# Patient Record
Sex: Female | Born: 1985
Health system: Southern US, Community
[De-identification: ages and names within clinical notes are randomized; demographics above are authoritative.]

## PROBLEM LIST (undated history)

## (undated) ENCOUNTER — Inpatient Hospital Stay: Admission: RE | Payer: Self-pay | Source: Home / Self Care

## (undated) DIAGNOSIS — M5430 Sciatica, unspecified side: Secondary | ICD-10-CM

## (undated) DIAGNOSIS — L509 Urticaria, unspecified: Secondary | ICD-10-CM

## (undated) DIAGNOSIS — R51 Headache: Secondary | ICD-10-CM

## (undated) DIAGNOSIS — N189 Chronic kidney disease, unspecified: Secondary | ICD-10-CM

## (undated) DIAGNOSIS — D649 Anemia, unspecified: Secondary | ICD-10-CM

## (undated) DIAGNOSIS — K219 Gastro-esophageal reflux disease without esophagitis: Secondary | ICD-10-CM

## (undated) DIAGNOSIS — E876 Hypokalemia: Secondary | ICD-10-CM

## (undated) HISTORY — PX: WISDOM TOOTH EXTRACTION: SHX21

## (undated) HISTORY — DX: Hypokalemia: E87.6

## (undated) HISTORY — DX: Sciatica, unspecified side: M54.30

## (undated) HISTORY — DX: Urticaria, unspecified: L50.9

## (undated) HISTORY — PX: FINGER FRACTURE SURGERY: SHX638

---

## 2011-06-04 ENCOUNTER — Emergency Department (HOSPITAL_COMMUNITY)
Admission: EM | Admit: 2011-06-04 | Discharge: 2011-06-04 | Disposition: A | Discharge status: Home / Self Care | Payer: No Typology Code available for payment source | Attending: Emergency Medicine | Admitting: Emergency Medicine

## 2011-06-04 ENCOUNTER — Emergency Department (HOSPITAL_COMMUNITY): Payer: No Typology Code available for payment source

## 2011-06-04 ENCOUNTER — Encounter: Payer: Self-pay | Admitting: Emergency Medicine

## 2011-06-04 DIAGNOSIS — IMO0002 Reserved for concepts with insufficient information to code with codable children: Secondary | ICD-10-CM | POA: Insufficient documentation

## 2011-06-04 DIAGNOSIS — M79609 Pain in unspecified limb: Secondary | ICD-10-CM | POA: Insufficient documentation

## 2011-06-04 DIAGNOSIS — R0789 Other chest pain: Secondary | ICD-10-CM

## 2011-06-04 DIAGNOSIS — S62607A Fracture of unspecified phalanx of left little finger, initial encounter for closed fracture: Secondary | ICD-10-CM

## 2011-06-04 LAB — CBC
HCT: 42.5 % (ref 36.0–46.0)
Hemoglobin: 14.7 g/dL (ref 12.0–15.0)
MCH: 31.9 pg (ref 26.0–34.0)
MCHC: 34.6 g/dL (ref 30.0–36.0)
MCV: 92.2 fL (ref 78.0–100.0)
Platelets: 223 10*3/uL (ref 150–400)
RBC: 4.61 MIL/uL (ref 3.87–5.11)
RDW: 13.4 % (ref 11.5–15.5)
WBC: 9.6 10*3/uL (ref 4.0–10.5)

## 2011-06-04 LAB — DIFFERENTIAL
Basophils Absolute: 0 10*3/uL (ref 0.0–0.1)
Basophils Relative: 0 % (ref 0–1)
Eosinophils Absolute: 0.3 10*3/uL (ref 0.0–0.7)
Eosinophils Relative: 3 % (ref 0–5)
Lymphocytes Relative: 32 % (ref 12–46)
Lymphs Abs: 3.1 10*3/uL (ref 0.7–4.0)
Monocytes Absolute: 0.6 10*3/uL (ref 0.1–1.0)
Monocytes Relative: 6 % (ref 3–12)
Neutro Abs: 5.7 10*3/uL (ref 1.7–7.7)
Neutrophils Relative %: 59 % (ref 43–77)

## 2011-06-04 LAB — POCT I-STAT, CHEM 8
BUN: 10 mg/dL (ref 6–23)
Calcium, Ion: 1.2 mmol/L (ref 1.12–1.32)
Chloride: 107 mEq/L (ref 96–112)
Creatinine, Ser: 0.8 mg/dL (ref 0.50–1.10)
Glucose, Bld: 106 mg/dL — ABNORMAL HIGH (ref 70–99)
HCT: 45 % (ref 36.0–46.0)
Hemoglobin: 15.3 g/dL — ABNORMAL HIGH (ref 12.0–15.0)
Potassium: 3.6 mEq/L (ref 3.5–5.1)
Sodium: 143 mEq/L (ref 135–145)
TCO2: 23 mmol/L (ref 0–100)

## 2011-06-04 MED ORDER — IOHEXOL 300 MG/ML  SOLN
93.0000 mL | Freq: Once | INTRAMUSCULAR | Status: AC | PRN
  Administered 2011-06-04: 93 mL via INTRAVENOUS

## 2011-06-04 MED ORDER — HYDROCODONE-ACETAMINOPHEN 5-325 MG PO TABS: 1.0000 | ORAL_TABLET | ORAL | Status: DC | PRN

## 2011-06-04 MED ORDER — IBUPROFEN 800 MG PO TABS: 800.0000 mg | ORAL_TABLET | Freq: Three times a day (TID) | ORAL | Status: AC

## 2011-06-04 MED ORDER — FENTANYL CITRATE 0.05 MG/ML IJ SOLN
100.0000 ug | Freq: Once | INTRAMUSCULAR | Status: AC
  Administered 2011-06-04: 100 ug via INTRAVENOUS
  Filled 2011-06-04: qty 2
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
## 2011-06-04 NOTE — Progress Notes (Signed)
Orthopedic Tech Progress Note Patient Details:  Judy Conner Jun 05, 1986 [id number] Finger splint     Cammer, Mickie Bail 06/04/2011, 11:17 AM

## 2011-06-04 NOTE — ED Notes (Signed)
Paged ortho tech to bedside for finger splint
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
## 2011-06-04 NOTE — ED Notes (Signed)
Pt to xray for scans, medicated per [facility]
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
## 2011-06-04 NOTE — ED Notes (Signed)
Dc iv 
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
## 2011-06-05 ENCOUNTER — Emergency Department (HOSPITAL_COMMUNITY)
Admission: EM | Admit: 2011-06-05 | Discharge: 2011-06-06 | Disposition: A | Discharge status: Home / Self Care | Payer: No Typology Code available for payment source | Attending: Emergency Medicine | Admitting: Emergency Medicine

## 2011-06-05 ENCOUNTER — Encounter (HOSPITAL_COMMUNITY): Payer: Self-pay | Admitting: *Deleted

## 2011-06-05 DIAGNOSIS — M549 Dorsalgia, unspecified: Secondary | ICD-10-CM

## 2011-06-05 DIAGNOSIS — R51 Headache: Secondary | ICD-10-CM

## 2011-06-05 DIAGNOSIS — M25519 Pain in unspecified shoulder: Secondary | ICD-10-CM | POA: Insufficient documentation

## 2011-06-05 DIAGNOSIS — M546 Pain in thoracic spine: Secondary | ICD-10-CM | POA: Insufficient documentation

## 2011-06-05 DIAGNOSIS — Z79899 Other long term (current) drug therapy: Secondary | ICD-10-CM | POA: Insufficient documentation

## 2011-06-05 DIAGNOSIS — S20219A Contusion of unspecified front wall of thorax, initial encounter: Secondary | ICD-10-CM | POA: Insufficient documentation

## 2011-06-05 DIAGNOSIS — M5380 Other specified dorsopathies, site unspecified: Secondary | ICD-10-CM | POA: Insufficient documentation
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
## 2011-06-06 ENCOUNTER — Emergency Department (HOSPITAL_COMMUNITY): Payer: No Typology Code available for payment source

## 2011-06-06 MED ORDER — DIAZEPAM 5 MG PO TABS: 5.0000 mg | ORAL_TABLET | Freq: Two times a day (BID) | ORAL | Status: AC
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
## 2011-06-09 ENCOUNTER — Other Ambulatory Visit: Payer: Self-pay | Admitting: Orthopedic Surgery

## 2011-06-09 ENCOUNTER — Encounter (HOSPITAL_COMMUNITY): Payer: Self-pay | Admitting: Pharmacy Technician

## 2011-06-09 NOTE — ED Provider Notes (Signed)
Evaluation and management procedures were performed by the mid-level provider (PA/NP/CNM) under my supervision/collaboration. I was present and available during the ED course. Judy Schnick Y.   Judy Conner. Shashana Fullington, MD 06/09/11 1024

## 2011-06-10 MED ORDER — DEXTROSE 5 % IV SOLN: 1.0000 g | INTRAVENOUS | Status: DC

## 2011-06-11 ENCOUNTER — Ambulatory Visit (HOSPITAL_COMMUNITY)
Admission: RE | Admit: 2011-06-11 | Discharge: 2011-06-11 | Disposition: A | Discharge status: Home / Self Care | Payer: No Typology Code available for payment source | Source: Ambulatory Visit | Attending: Orthopedic Surgery | Admitting: Orthopedic Surgery

## 2011-06-11 ENCOUNTER — Encounter (HOSPITAL_COMMUNITY): Payer: Self-pay | Admitting: *Deleted

## 2011-06-11 ENCOUNTER — Ambulatory Visit (HOSPITAL_COMMUNITY): Payer: No Typology Code available for payment source | Admitting: *Deleted

## 2011-06-11 ENCOUNTER — Encounter (HOSPITAL_COMMUNITY)
Admission: RE | Disposition: A | Discharge status: Home / Self Care | Payer: Self-pay | Source: Ambulatory Visit | Attending: Orthopedic Surgery

## 2011-06-11 DIAGNOSIS — S62639A Displaced fracture of distal phalanx of unspecified finger, initial encounter for closed fracture: Secondary | ICD-10-CM | POA: Insufficient documentation

## 2011-06-11 DIAGNOSIS — K219 Gastro-esophageal reflux disease without esophagitis: Secondary | ICD-10-CM | POA: Insufficient documentation

## 2011-06-11 DIAGNOSIS — S62609A Fracture of unspecified phalanx of unspecified finger, initial encounter for closed fracture: Secondary | ICD-10-CM

## 2011-06-11 DIAGNOSIS — F17200 Nicotine dependence, unspecified, uncomplicated: Secondary | ICD-10-CM | POA: Insufficient documentation

## 2011-06-11 HISTORY — DX: Gastro-esophageal reflux disease without esophagitis: K21.9

## 2011-06-11 HISTORY — DX: Headache: R51

## 2011-06-11 HISTORY — DX: Anemia, unspecified: D64.9

## 2011-06-11 HISTORY — DX: Chronic kidney disease, unspecified: N18.9

## 2011-06-11 LAB — COMPREHENSIVE METABOLIC PANEL
ALT: 17 U/L (ref 0–35)
AST: 18 U/L (ref 0–37)
Albumin: 3.9 g/dL (ref 3.5–5.2)
Alkaline Phosphatase: 62 U/L (ref 39–117)
BUN: 12 mg/dL (ref 6–23)
CO2: 25 mEq/L (ref 19–32)
Calcium: 9.9 mg/dL (ref 8.4–10.5)
Chloride: 103 mEq/L (ref 96–112)
Creatinine, Ser: 0.82 mg/dL (ref 0.50–1.10)
GFR calc Af Amer: >90 mL/min (ref >90)
GFR calc non Af Amer: >90 mL/min (ref >90)
Glucose, Bld: 97 mg/dL (ref 70–99)
Potassium: 3.8 mEq/L (ref 3.5–5.1)
Sodium: 140 mEq/L (ref 135–145)
Total Bilirubin: 0.3 mg/dL (ref 0.3–1.2)
Total Protein: 7 g/dL (ref 6.0–8.3)

## 2011-06-11 LAB — PROTIME-INR
INR: 1.06 (ref 0.00–1.49)
Prothrombin Time: 14 seconds (ref 11.6–15.2)

## 2011-06-11 LAB — URINE MICROSCOPIC-ADD ON

## 2011-06-11 LAB — URINALYSIS, ROUTINE W REFLEX MICROSCOPIC
Bilirubin Urine: NEGATIVE
Glucose, UA: NEGATIVE mg/dL
Ketones, ur: NEGATIVE mg/dL
Leukocytes, UA: NEGATIVE
Nitrite: NEGATIVE
Protein, ur: NEGATIVE mg/dL
Specific Gravity, Urine: 1.026 (ref 1.005–1.030)
Urobilinogen, UA: 1 mg/dL (ref 0.0–1.0)
pH: 6 (ref 5.0–8.0)

## 2011-06-11 LAB — CBC
HCT: 40.9 % (ref 36.0–46.0)
Hemoglobin: 13.8 g/dL (ref 12.0–15.0)
MCH: 30.9 pg (ref 26.0–34.0)
MCHC: 33.7 g/dL (ref 30.0–36.0)
MCV: 91.7 fL (ref 78.0–100.0)
Platelets: 251 10*3/uL (ref 150–400)
RBC: 4.46 MIL/uL (ref 3.87–5.11)
RDW: 13 % (ref 11.5–15.5)
WBC: 9.3 10*3/uL (ref 4.0–10.5)

## 2011-06-11 LAB — APTT: aPTT: 34 seconds (ref 24–37)

## 2011-06-11 LAB — SURGICAL PCR SCREEN
MRSA, PCR: NEGATIVE
Staphylococcus aureus: POSITIVE — AB

## 2011-06-11 LAB — HCG, SERUM, QUALITATIVE: Preg, Serum: NEGATIVE

## 2011-06-11 SURGERY — CLOSED REDUCTION, FRACTURE, METACARPAL BONE, WITH PERCUTANEOUS PINNING
Anesthesia: General | Site: Finger | Laterality: Left | Wound class: Clean

## 2011-06-11 MED ORDER — MUPIROCIN 2 % EX OINT
TOPICAL_OINTMENT | CUTANEOUS | Status: AC
  Filled 2011-06-11: qty 22

## 2011-06-11 MED ORDER — MUPIROCIN 2 % EX OINT
TOPICAL_OINTMENT | Freq: Two times a day (BID) | CUTANEOUS | Status: DC
  Administered 2011-06-11: 06:00:00 via NASAL

## 2011-06-11 MED ORDER — ONDANSETRON HCL 4 MG/2ML IJ SOLN
4.0000 mg | Freq: Once | INTRAMUSCULAR | Status: AC | PRN
  Administered 2011-06-11: 4 mg via INTRAVENOUS

## 2011-06-11 MED ORDER — PROMETHAZINE HCL 25 MG RE SUPP
25.0000 mg | RECTAL | Status: DC
  Filled 2011-06-11: qty 1

## 2011-06-11 MED ORDER — MIDAZOLAM HCL 5 MG/5ML IJ SOLN
INTRAMUSCULAR | Status: DC | PRN
  Administered 2011-06-11: 2 mg via INTRAVENOUS

## 2011-06-11 MED ORDER — ONDANSETRON HCL 4 MG/2ML IJ SOLN
INTRAMUSCULAR | Status: DC | PRN
  Administered 2011-06-11: 4 mg via INTRAVENOUS

## 2011-06-11 MED ORDER — KCL IN DEXTROSE-NACL 20-5-0.45 MEQ/L-%-% IV SOLN
INTRAVENOUS | Status: DC
  Filled 2011-06-11 (×2): qty 1000

## 2011-06-11 MED ORDER — FENTANYL CITRATE 0.05 MG/ML IJ SOLN
INTRAMUSCULAR | Status: DC | PRN
  Administered 2011-06-11 (×3): 25 ug via INTRAVENOUS
  Administered 2011-06-11 (×2): 50 ug via INTRAVENOUS
  Administered 2011-06-11 (×3): 25 ug via INTRAVENOUS

## 2011-06-11 MED ORDER — SODIUM CHLORIDE 0.9 % IR SOLN
Status: DC | PRN
  Administered 2011-06-11: 1000 mL

## 2011-06-11 MED ORDER — MORPHINE SULFATE 2 MG/ML IJ SOLN: 0.0500 mg/kg | INTRAMUSCULAR | Status: DC | PRN

## 2011-06-11 MED ORDER — CEFAZOLIN SODIUM 1-5 GM-% IV SOLN
INTRAVENOUS | Status: AC
  Administered 2011-06-11: 1 g via INTRAVENOUS
  Filled 2011-06-11: qty 50

## 2011-06-11 MED ORDER — MEPERIDINE HCL 25 MG/ML IJ SOLN: 6.2500 mg | INTRAMUSCULAR | Status: DC | PRN

## 2011-06-11 MED ORDER — LACTATED RINGERS IV SOLN
INTRAVENOUS | Status: DC | PRN
  Administered 2011-06-11 (×2): via INTRAVENOUS

## 2011-06-11 MED ORDER — BUPIVACAINE HCL (PF) 0.25 % IJ SOLN
INTRAMUSCULAR | Status: DC | PRN
  Administered 2011-06-11: 5 mL

## 2011-06-11 MED ORDER — MUPIROCIN 2 % EX OINT: TOPICAL_OINTMENT | Freq: Two times a day (BID) | CUTANEOUS | Status: DC

## 2011-06-11 MED ORDER — HYDROMORPHONE HCL PF 1 MG/ML IJ SOLN
0.2500 mg | INTRAMUSCULAR | Status: DC | PRN
  Administered 2011-06-11 (×3): 0.25 mg via INTRAVENOUS

## 2011-06-11 MED ORDER — PROPOFOL 10 MG/ML IV EMUL
INTRAVENOUS | Status: DC | PRN
  Administered 2011-06-11: 200 mg via INTRAVENOUS

## 2011-06-11 MED ORDER — CHLORHEXIDINE GLUCONATE 4 % EX LIQD: 60.0000 mL | Freq: Once | CUTANEOUS | Status: DC

## 2011-06-11 MED ORDER — HYDROCODONE-ACETAMINOPHEN 5-325 MG PO TABS: 1.0000 | ORAL_TABLET | ORAL | Status: AC | PRN

## 2011-06-11 SURGICAL SUPPLY — 49 items
BANDAGE ELASTIC 3 VELCRO ST LF (GAUZE/BANDAGES/DRESSINGS) ×2 IMPLANT
BANDAGE ELASTIC 4 VELCRO ST LF (GAUZE/BANDAGES/DRESSINGS) ×2 IMPLANT
BANDAGE GAUZE ELAST BULKY 4 IN (GAUZE/BANDAGES/DRESSINGS) ×2 IMPLANT
BLADE SURG ROTATE 9660 (MISCELLANEOUS) IMPLANT
BNDG ELASTIC 2 VLCR STRL LF (GAUZE/BANDAGES/DRESSINGS) ×2 IMPLANT
BNDG ESMARK 4X9 LF (GAUZE/BANDAGES/DRESSINGS) IMPLANT
CLOTH BEACON ORANGE TIMEOUT ST (SAFETY) ×2 IMPLANT
COVER SURGICAL LIGHT HANDLE (MISCELLANEOUS) ×2 IMPLANT
CUFF TOURNIQUET SINGLE 18IN (TOURNIQUET CUFF) ×2 IMPLANT
CUFF TOURNIQUET SINGLE 24IN (TOURNIQUET CUFF) IMPLANT
DRAPE OEC MINIVIEW 54X84 (DRAPES) ×2 IMPLANT
DRAPE U-SHAPE 47X51 STRL (DRAPES) ×2 IMPLANT
ELECT REM PT RETURN 9FT ADLT (ELECTROSURGICAL)
ELECTRODE REM PT RTRN 9FT ADLT (ELECTROSURGICAL) IMPLANT
GAUZE SPONGE 4X4 12PLY STRL LF (GAUZE/BANDAGES/DRESSINGS) ×2 IMPLANT
GAUZE XEROFORM 1X8 LF (GAUZE/BANDAGES/DRESSINGS) ×2 IMPLANT
GLOVE BIO SURGEON STRL SZ7.5 (GLOVE) ×2 IMPLANT
GLOVE BIOGEL M 8.0 STRL (GLOVE) ×2 IMPLANT
GLOVE BIOGEL M STRL SZ7.5 (GLOVE) ×2 IMPLANT
GLOVE BIOGEL PI IND STRL 6.5 (GLOVE) ×1 IMPLANT
GLOVE BIOGEL PI IND STRL 7.5 (GLOVE) ×1 IMPLANT
GLOVE BIOGEL PI IND STRL 8 (GLOVE) ×2 IMPLANT
GLOVE BIOGEL PI INDICATOR 6.5 (GLOVE) ×1
GLOVE BIOGEL PI INDICATOR 7.5 (GLOVE) ×1
GLOVE BIOGEL PI INDICATOR 8 (GLOVE) ×2
GLOVE SKINSENSE NS SZ7.5 (GLOVE) ×1
GLOVE SKINSENSE STRL SZ7.5 (GLOVE) ×1 IMPLANT
GOWN PREVENTION PLUS XLARGE (GOWN DISPOSABLE) ×6 IMPLANT
GOWN STRL NON-REIN LRG LVL3 (GOWN DISPOSABLE) ×6 IMPLANT
KIT BASIN OR (CUSTOM PROCEDURE TRAY) ×2 IMPLANT
KIT ROOM TURNOVER OR (KITS) ×2 IMPLANT
KWIRE 4.0 X .045IN (WIRE) ×4 IMPLANT
MANIFOLD NEPTUNE II (INSTRUMENTS) IMPLANT
NEEDLE 22X1 1/2 (OR ONLY) (NEEDLE) IMPLANT
NS IRRIG 1000ML POUR BTL (IV SOLUTION) ×2 IMPLANT
PACK ORTHO EXTREMITY (CUSTOM PROCEDURE TRAY) ×2 IMPLANT
PAD ARMBOARD 7.5X6 YLW CONV (MISCELLANEOUS) ×2 IMPLANT
PAD CAST 4YDX4 CTTN HI CHSV (CAST SUPPLIES) ×1 IMPLANT
PADDING CAST COTTON 2X4 NS (CAST SUPPLIES) ×2 IMPLANT
PADDING CAST COTTON 4X4 STRL (CAST SUPPLIES) ×1
SPONGE GAUZE 4X4 12PLY (GAUZE/BANDAGES/DRESSINGS) ×2 IMPLANT
SPONGE LAP 4X18 X RAY DECT (DISPOSABLE) IMPLANT
SUCTION FRAZIER TIP 10 FR DISP (SUCTIONS) IMPLANT
SUT ETHILON 4 0 PS 2 18 (SUTURE) IMPLANT
SYR CONTROL 10ML LL (SYRINGE) IMPLANT
TOWEL OR 17X24 6PK STRL BLUE (TOWEL DISPOSABLE) ×2 IMPLANT
TOWEL OR 17X26 10 PK STRL BLUE (TOWEL DISPOSABLE) ×2 IMPLANT
TUBE CONNECTING 12X1/4 (SUCTIONS) ×2 IMPLANT
WATER STERILE IRR 1000ML POUR (IV SOLUTION) ×2 IMPLANT

## 2011-06-11 NOTE — Preoperative (Signed)
Beta Blockers   Reason not to administer Beta Blockers:Not Applicable 

## 2011-06-11 NOTE — Brief Op Note (Signed)
06/11/2011  8:37 AM  PATIENT:  Judy Conner  25 y.o. female  PRE-OPERATIVE DIAGNOSIS:  Left Fifth Finger Fracture  POST-OPERATIVE DIAGNOSIS:  Left Fifth Finger Fracture  PROCEDURE:  Procedure(s): CLOSED REDUCTION PHALANX WITH PERCUTANEOUS PINNING  SURGEON:  Surgeon(s): Mable Paris, MD  PHYSICIAN ASSISTANT:   ASSISTANTS: none   ANESTHESIA:   local and general  EBL:  Total I/O In: 1000 [I.V.:1000] Out: 10 [Blood:10]  BLOOD ADMINISTERED:none  DRAINS: none   LOCAL MEDICATIONS USED:  MARCAINE 5CC  SPECIMEN:  No Specimen  DISPOSITION OF SPECIMEN:  N/A  COUNTS:  YES  TOURNIQUET:  * Missing tourniquet times found for documented tourniquets in log:  10814 *  DICTATION: .Other Dictation: Dictation Number [id number]  PLAN OF CARE: Discharge to home after PACU  PATIENT DISPOSITION:  PACU - hemodynamically stable.   Delay start of Pharmacological VTE agent (>24hrs) due to surgical blood loss or risk of bleeding:  {YES/NO/NOT APPLICABLE:20182

## 2011-06-11 NOTE — Transfer of Care (Signed)
Immediate Anesthesia Transfer of Care Note  Patient: Judy Conner  Procedure(s) Performed:  CLOSED REDUCTION METACARPAL WITH PERCUTANEOUS PINNING - left 5th finger  Patient Location: PACU  Anesthesia Type: General  Level of Consciousness: awake, oriented and patient cooperative  Airway & Oxygen Therapy: Patient Spontanous Breathing and Patient connected to nasal cannula oxygen  Post-op Assessment: Report given to PACU RN and Post -op Vital signs reviewed and stable  Post vital signs: Reviewed  Complications: No apparent anesthesia complications

## 2011-06-11 NOTE — Progress Notes (Signed)
After pt was in Phase 2 approx 10 minutes, she reported feeling nauseous  Vomited less than 3 ml of clear phlegm.  Dr Sondra Come notified and order received to send pt home with one phenergan suppository and for pt to use only if needed.  Pt instructed on this and verbalized understanding.//L. Luiza Carranco,RN

## 2011-06-11 NOTE — Anesthesia Postprocedure Evaluation (Signed)
  Anesthesia Post-op Note  Patient: Judy Conner  Procedure(s) Performed:  CLOSED REDUCTION METACARPAL WITH PERCUTANEOUS PINNING - left 5th finger  Patient Location: PACU  Anesthesia Type: General  Level of Consciousness: awake, alert  and oriented  Airway and Oxygen Therapy: Patient Spontanous Breathing  Post-op Pain: mild  Post-op Assessment: Post-op Vital signs reviewed, Patient's Cardiovascular Status Stable, Respiratory Function Stable, Patent Airway, No signs of Nausea or vomiting and Pain level controlled  Post-op Vital Signs: Reviewed and stable  Complications: No apparent anesthesia complications

## 2011-06-11 NOTE — Anesthesia Preprocedure Evaluation (Addendum)
Anesthesia Evaluation  Patient identified by MRN, date of birth, ID band Patient awake    Reviewed: Allergy & Precautions, NPO status   History of Anesthesia Complications Negative for: history of anesthetic complications  Airway Mallampati: I TM Distance: >3 FB Neck ROM: Full    Dental  (+) Dental Advisory Given   Pulmonary Current Smoker,  clear to auscultation        Cardiovascular Regular Normal    Neuro/Psych  Headaches,    GI/Hepatic GERD-  Medicated and Controlled,  Endo/Other    Renal/GU      Musculoskeletal   Abdominal Normal abdominal exam  (+)   Peds  Hematology   Anesthesia Other Findings   Reproductive/Obstetrics                           Anesthesia Physical Anesthesia Plan  ASA: II  Anesthesia Plan: General   Post-op Pain Management:    Induction: Intravenous  Airway Management Planned: Oral ETT  Additional Equipment:   Intra-op Plan:   Post-operative Plan: Extubation in OR  Informed Consent: I have reviewed the patients History and Physical, chart, labs and discussed the procedure including the risks, benefits and alternatives for the proposed anesthesia with the patient or authorized representative who has indicated his/her understanding and acceptance.   Dental advisory given  Plan Discussed with: Anesthesiologist  Anesthesia Plan Comments:         Anesthesia Quick Evaluation
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
## 2011-06-11 NOTE — Progress Notes (Signed)
One phenergan 25 mg suppository given to pt.  Re instructed pt to take only as needed and pt verbalized understanding.
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
## 2011-06-11 NOTE — Addendum Note (Signed)
Addendum  created 06/11/11 1013 by Kerby Nora, MD   Modules edited:Orders

## 2012-03-19 ENCOUNTER — Emergency Department (HOSPITAL_COMMUNITY)
Admission: EM | Admit: 2012-03-19 | Discharge: 2012-03-19 | Disposition: A | Discharge status: Home / Self Care | Payer: BC Managed Care – PPO | Attending: Emergency Medicine | Admitting: Emergency Medicine

## 2012-03-19 ENCOUNTER — Encounter (HOSPITAL_COMMUNITY): Payer: Self-pay

## 2012-03-19 DIAGNOSIS — N189 Chronic kidney disease, unspecified: Secondary | ICD-10-CM | POA: Insufficient documentation

## 2012-03-19 DIAGNOSIS — Z87891 Personal history of nicotine dependence: Secondary | ICD-10-CM | POA: Insufficient documentation

## 2012-03-19 DIAGNOSIS — R112 Nausea with vomiting, unspecified: Secondary | ICD-10-CM | POA: Insufficient documentation

## 2012-03-19 LAB — COMPREHENSIVE METABOLIC PANEL
ALT: 26 U/L (ref 0–35)
AST: 21 U/L (ref 0–37)
Albumin: 4.2 g/dL (ref 3.5–5.2)
Alkaline Phosphatase: 63 U/L (ref 39–117)
BUN: 13 mg/dL (ref 6–23)
CO2: 25 mEq/L (ref 19–32)
Calcium: 9.4 mg/dL (ref 8.4–10.5)
Chloride: 102 mEq/L (ref 96–112)
Creatinine, Ser: 0.91 mg/dL (ref 0.50–1.10)
GFR calc Af Amer: >90 mL/min (ref >90)
GFR calc non Af Amer: 87 mL/min — ABNORMAL LOW (ref >90)
Glucose, Bld: 130 mg/dL — ABNORMAL HIGH (ref 70–99)
Potassium: 3 mEq/L — ABNORMAL LOW (ref 3.5–5.1)
Sodium: 139 mEq/L (ref 135–145)
Total Bilirubin: 0.3 mg/dL (ref 0.3–1.2)
Total Protein: 7.8 g/dL (ref 6.0–8.3)

## 2012-03-19 LAB — CBC WITH DIFFERENTIAL/PLATELET
Basophils Absolute: 0 10*3/uL (ref 0.0–0.1)
Basophils Relative: 0 % (ref 0–1)
Eosinophils Absolute: 0 10*3/uL (ref 0.0–0.7)
Eosinophils Relative: 0 % (ref 0–5)
HCT: 42.6 % (ref 36.0–46.0)
Hemoglobin: 15.1 g/dL — ABNORMAL HIGH (ref 12.0–15.0)
Lymphocytes Relative: 22 % (ref 12–46)
Lymphs Abs: 3.5 10*3/uL (ref 0.7–4.0)
MCH: 32.1 pg (ref 26.0–34.0)
MCHC: 35.4 g/dL (ref 30.0–36.0)
MCV: 90.6 fL (ref 78.0–100.0)
Monocytes Absolute: 0.9 10*3/uL (ref 0.1–1.0)
Monocytes Relative: 6 % (ref 3–12)
Neutro Abs: 11.1 10*3/uL — ABNORMAL HIGH (ref 1.7–7.7)
Neutrophils Relative %: 72 % (ref 43–77)
Platelets: 280 10*3/uL (ref 150–400)
RBC: 4.7 MIL/uL (ref 3.87–5.11)
RDW: 13.7 % (ref 11.5–15.5)
WBC: 15.5 10*3/uL — ABNORMAL HIGH (ref 4.0–10.5)

## 2012-03-19 LAB — URINALYSIS, ROUTINE W REFLEX MICROSCOPIC
Glucose, UA: NEGATIVE mg/dL
Ketones, ur: 15 mg/dL — AB
Nitrite: NEGATIVE
Protein, ur: 100 mg/dL — AB
Specific Gravity, Urine: 1.04 — ABNORMAL HIGH (ref 1.005–1.030)
Urobilinogen, UA: 1 mg/dL (ref 0.0–1.0)
pH: 6 (ref 5.0–8.0)

## 2012-03-19 LAB — URINE MICROSCOPIC-ADD ON

## 2012-03-19 LAB — POCT PREGNANCY, URINE: Preg Test, Ur: NEGATIVE

## 2012-03-19 MED ORDER — POTASSIUM CHLORIDE CRYS ER 20 MEQ PO TBCR
20.0000 meq | EXTENDED_RELEASE_TABLET | Freq: Once | ORAL | Status: AC
  Administered 2012-03-19: 20 meq via ORAL
  Filled 2012-03-19: qty 1

## 2012-03-19 MED ORDER — ONDANSETRON 4 MG PO TBDP
4.0000 mg | ORAL_TABLET | Freq: Once | ORAL | Status: AC
  Administered 2012-03-19: 4 mg via ORAL
  Filled 2012-03-19: qty 1

## 2012-03-19 MED ORDER — ONDANSETRON HCL 4 MG/2ML IJ SOLN
4.0000 mg | Freq: Once | INTRAMUSCULAR | Status: AC
  Administered 2012-03-19: 4 mg via INTRAVENOUS
  Filled 2012-03-19: qty 2

## 2012-03-19 MED ORDER — ONDANSETRON 8 MG PO TBDP: 8.0000 mg | ORAL_TABLET | Freq: Three times a day (TID) | ORAL | Status: AC | PRN

## 2012-03-19 MED ORDER — POTASSIUM CHLORIDE 10 MEQ/100ML IV SOLN
10.0000 meq | Freq: Once | INTRAVENOUS | Status: AC
  Administered 2012-03-19: 10 meq via INTRAVENOUS
  Filled 2012-03-19: qty 100

## 2012-03-19 MED ORDER — SODIUM CHLORIDE 0.9 % IV BOLUS (SEPSIS)
1000.0000 mL | Freq: Once | INTRAVENOUS | Status: AC
  Administered 2012-03-19: 1000 mL via INTRAVENOUS

## 2012-03-19 NOTE — ED Notes (Signed)
Pt reports lower abd pain when vomiting, N/V x3 days, unable to keep food down. Pt denies burning w/urination, abnormal vaginal bleeding, odor, or d/c. Pt reports she uses ParaGard for birth control and is unsure if that could be causing her symptoms. Pt reports LMP 03/07/2012, para gard implanted 2010, no problems w/implant before.

## 2012-03-19 NOTE — ED Notes (Signed)
Water given to patient 

## 2012-03-19 NOTE — ED Provider Notes (Signed)
History     CSN: [id number]  Arrival date & time 03/19/12  0017   First MD Initiated Contact with Patient 03/19/12 0602      Chief Complaint  Patient presents with  . Nausea  . Emesis    (Consider location/radiation/quality/duration/timing/severity/associated sxs/prior treatment) HPI Comments: Patient presents with two days of nausea and vomiting.  She reports several episodes of vomiting.  She denies diarrhea.  No blood in her emesis or blood in her stool.  She denies abdominal pain.  No fever or chills.  No sick contacts.  No recent foreign travel.    Patient is a 26 y.o. female presenting with vomiting. The history is provided by the patient.  Emesis  The problem occurs 2 to 4 times per day. The problem has been gradually worsening. The emesis has an appearance of stomach contents. There has been no fever. Pertinent negatives include no abdominal pain, no chills, no diarrhea and no fever.    Past Medical History  Diagnosis Date  . Anemia     hx low iron   . Chronic kidney disease     hx uti none currently  . GERD (gastroesophageal reflux disease)   . Headache     Past Surgical History  Procedure Date  . Cesarean section [phone]    History reviewed. No pertinent family history.  History  Substance Use Topics  . Smoking status: Former Smoker    Quit date: 05/20/2011  . Smokeless tobacco: Not on file  . Alcohol Use: No    OB History    Grav Para Term Preterm Abortions TAB SAB Ect Mult Living                  Review of Systems  Constitutional: Negative for fever and chills.  Gastrointestinal: Positive for nausea and vomiting. Negative for abdominal pain, diarrhea, constipation, blood in stool and abdominal distention.  Genitourinary: Negative for dysuria, urgency, frequency, hematuria, flank pain, vaginal discharge, difficulty urinating and pelvic pain.    Allergies  Review of patient's allergies indicates no known allergies.  Home Medications    Current Outpatient Rx  Name Route Sig Dispense Refill  . NAPROXEN SODIUM 220 MG PO TABS Oral Take 220 mg by mouth daily as needed. For pain    . RANITIDINE HCL 150 MG PO TABS Oral Take 150 mg by mouth daily as needed. For acid reflux       BP 112/66  Pulse 66  Temp 99 F (37.2 C) (Oral)  Resp 16  SpO2 95%  LMP 03/07/2012  Physical Exam  Nursing note and vitals reviewed. Constitutional: She appears well-developed and well-nourished. No distress.  HENT:  Head: Normocephalic and atraumatic.  Mouth/Throat: Oropharynx is clear and moist.  Neck: Normal range of motion. Neck supple.  Cardiovascular: Normal rate, regular rhythm and normal heart sounds.   Pulmonary/Chest: Effort normal and breath sounds normal.  Abdominal: Soft. Normal appearance and bowel sounds are normal. She exhibits no distension and no mass. There is no tenderness. There is no rebound, no guarding, no tenderness at McBurney's point and negative Murphy's sign.  Neurological: She is alert.  Skin: Skin is warm and dry. She is not diaphoretic.  Psychiatric: She has a normal mood and affect.    ED Course  Procedures (including critical care time)  Labs Reviewed  CBC WITH DIFFERENTIAL - Abnormal; Notable for the following:    WBC 15.5 (*)     Hemoglobin 15.1 (*)     Neutro Abs  11.1 (*)     All other components within normal limits  COMPREHENSIVE METABOLIC PANEL - Abnormal; Notable for the following:    Potassium 3.0 (*)     Glucose, Bld 130 (*)     GFR calc non Af Amer 87 (*)     All other components within normal limits  URINALYSIS, ROUTINE W REFLEX MICROSCOPIC - Abnormal; Notable for the following:    Color, Urine AMBER (*)  BIOCHEMICALS MAY BE AFFECTED BY COLOR   APPearance TURBID (*)     Specific Gravity, Urine 1.040 (*)     Hgb urine dipstick LARGE (*)     Bilirubin Urine SMALL (*)     Ketones, ur 15 (*)     Protein, ur 100 (*)     Leukocytes, UA SMALL (*)     All other components within normal  limits  URINE MICROSCOPIC-ADD ON - Abnormal; Notable for the following:    Squamous Epithelial / LPF MANY (*)     Bacteria, UA MANY (*)     All other components within normal limits  POCT PREGNANCY, URINE   No results found.   No diagnosis found.  Patient able to tolerate po liquids.  MDM  Patient presenting with nausea and vomiting over the past 2 days.  No abdominal pain.  On exam abdomen nontender.  Urine pregnancy negative.  VSS.  Patient is afebrile.  Patient able to tolerate po liquids.  Patient discharged home with prescription for Zofran.  Return precautions discussed.  Patient in agreement with the plan.        Pascal Lux [place], PA-C 03/21/12 0145

## 2012-03-19 NOTE — ED Notes (Signed)
Called to waiting area without reply must assume pt eloped

## 2012-03-19 NOTE — ED Notes (Signed)
Pt is actively vomiting at triage and trying to find patient a room.

## 2012-03-19 NOTE — ED Notes (Signed)
Pt states that she has been nauseated and vomiting since Friday, pt states that she has not eaten or been able to drink since Friday as well. Pt denies pain, but states she just feels bad. Pt not actively vomiting but sates she usually vomits every couple of hours.

## 2012-04-03 ENCOUNTER — Emergency Department (INDEPENDENT_AMBULATORY_CARE_PROVIDER_SITE_OTHER): Payer: BC Managed Care – PPO

## 2012-04-03 ENCOUNTER — Encounter (HOSPITAL_COMMUNITY): Payer: Self-pay | Admitting: Emergency Medicine

## 2012-04-03 ENCOUNTER — Emergency Department (INDEPENDENT_AMBULATORY_CARE_PROVIDER_SITE_OTHER)
Admission: EM | Admit: 2012-04-03 | Discharge: 2012-04-03 | Disposition: A | Discharge status: Home / Self Care | Payer: BC Managed Care – PPO | Source: Home / Self Care | Attending: Emergency Medicine | Admitting: Emergency Medicine

## 2012-04-03 DIAGNOSIS — K219 Gastro-esophageal reflux disease without esophagitis: Secondary | ICD-10-CM

## 2012-04-03 DIAGNOSIS — R112 Nausea with vomiting, unspecified: Secondary | ICD-10-CM

## 2012-04-03 LAB — POCT URINALYSIS DIP (DEVICE)
Bilirubin Urine: NEGATIVE
Glucose, UA: NEGATIVE mg/dL
Hgb urine dipstick: NEGATIVE
Ketones, ur: 15 mg/dL — AB
Leukocytes, UA: NEGATIVE
Nitrite: NEGATIVE
Protein, ur: >=300 mg/dL — AB
Specific Gravity, Urine: 1.01 (ref 1.005–1.030)
Urobilinogen, UA: 0.2 mg/dL (ref 0.0–1.0)
pH: 8.5 — ABNORMAL HIGH (ref 5.0–8.0)

## 2012-04-03 LAB — POCT I-STAT, CHEM 8
BUN: 6 mg/dL (ref 6–23)
Calcium, Ion: 1.18 mmol/L (ref 1.12–1.23)
Chloride: 108 mEq/L (ref 96–112)
Creatinine, Ser: 0.8 mg/dL (ref 0.50–1.10)
Glucose, Bld: 130 mg/dL — ABNORMAL HIGH (ref 70–99)
HCT: 50 % — ABNORMAL HIGH (ref 36.0–46.0)
Hemoglobin: 17 g/dL — ABNORMAL HIGH (ref 12.0–15.0)
Potassium: 3.8 mEq/L (ref 3.5–5.1)
Sodium: 141 mEq/L (ref 135–145)
TCO2: 21 mmol/L (ref 0–100)

## 2012-04-03 LAB — POCT H PYLORI SCREEN: H. PYLORI SCREEN, POC: NEGATIVE

## 2012-04-03 LAB — POCT PREGNANCY, URINE: Preg Test, Ur: NEGATIVE

## 2012-04-03 MED ORDER — PANTOPRAZOLE SODIUM 20 MG PO TBEC: 40.0000 mg | DELAYED_RELEASE_TABLET | Freq: Every day | ORAL | Status: DC

## 2012-04-03 MED ORDER — ONDANSETRON 4 MG PO TBDP
ORAL_TABLET | ORAL | Status: AC
  Filled 2012-04-03: qty 2

## 2012-04-03 MED ORDER — GI COCKTAIL ~~LOC~~
ORAL | Status: AC
  Filled 2012-04-03: qty 30

## 2012-04-03 MED ORDER — PANTOPRAZOLE SODIUM 40 MG PO TBEC: 40.0000 mg | DELAYED_RELEASE_TABLET | Freq: Every day | ORAL | Status: DC

## 2012-04-03 MED ORDER — GI COCKTAIL ~~LOC~~
30.0000 mL | Freq: Once | ORAL | Status: AC
  Administered 2012-04-03: 30 mL via ORAL

## 2012-04-03 MED ORDER — ONDANSETRON HCL 4 MG PO TABS: 4.0000 mg | ORAL_TABLET | Freq: Four times a day (QID) | ORAL | Status: DC

## 2012-04-03 MED ORDER — ONDANSETRON 4 MG PO TBDP
8.0000 mg | ORAL_TABLET | Freq: Once | ORAL | Status: AC
  Administered 2012-04-03: 8 mg via ORAL

## 2012-04-03 NOTE — ED Notes (Signed)
C/o of nausea and vomiting- unable to say how many times she has vomited No vomiting at present

## 2012-04-03 NOTE — ED Notes (Addendum)
Pt vomited large amt liquid emesis- Np Chatten informed- order reced  Hold GI cocktail till zofran begins to work

## 2012-04-03 NOTE — ED Provider Notes (Signed)
History     CSN: [id number]  Arrival date & time 04/03/12  4098   First MD Initiated Contact with Patient 04/03/12 1122      Chief Complaint  Patient presents with  . Nausea    (Consider location/radiation/quality/duration/timing/severity/associated sxs/prior treatment) Patient is a 26 y.o. female presenting with vomiting. The history is provided by the patient.  Emesis  This is a recurrent problem. The current episode started yesterday. The problem occurs 2 to 4 times per day. The problem has not changed since onset.The emesis has an appearance of stomach contents. There has been no fever. Associated symptoms include cough and headaches. Pertinent negatives include no abdominal pain, no chills, no diarrhea and no fever. Risk factors: no known.  Patient reports vomiting since yesterday associated with heartburn and indigestion symptoms.  Reports burning sensation in stomach, fullness in throat and brassy taste in mouth.  States she is unable to keep food down, but able to drink liquids.  States previous episode of same 2 weeks ago, evaluated at ER without clear etiology of problem.  States unable to follow up with primary care provider in the past 2 weeks as suggested. Has not taken medications at home for symptoms.    Past Medical History  Diagnosis Date  . Anemia     hx low iron   . Chronic kidney disease     hx uti none currently  . GERD (gastroesophageal reflux disease)   . Headache     Past Surgical History  Procedure Date  . Cesarean section [phone]    No family history on file.  History  Substance Use Topics  . Smoking status: Former Smoker    Quit date: 05/20/2011  . Smokeless tobacco: Not on file  . Alcohol Use: No    OB History    Grav Para Term Preterm Abortions TAB SAB Ect Mult Living                  Review of Systems  Constitutional: Positive for fatigue. Negative for fever, chills, activity change and appetite change.  HENT: Positive for  voice change.   Respiratory: Positive for cough. Negative for chest tightness, shortness of breath and wheezing.   Cardiovascular: Negative.   Gastrointestinal: Positive for nausea, vomiting and constipation. Negative for abdominal pain, diarrhea, blood in stool and abdominal distention.  Genitourinary: Negative.   Neurological: Positive for light-headedness and headaches. Negative for dizziness, syncope, speech difficulty, weakness and numbness.    Allergies  Review of patient's allergies indicates no known allergies.  Home Medications   Current Outpatient Rx  Name Route Sig Dispense Refill  . ONDANSETRON HCL 4 MG PO TABS Oral Take 1 tablet (4 mg total) by mouth every 6 (six) hours. 20 tablet 0  . PANTOPRAZOLE SODIUM 40 MG PO TBEC Oral Take 1 tablet (40 mg total) by mouth daily. 30 tablet 2  . RANITIDINE HCL 150 MG PO TABS Oral Take 150 mg by mouth daily as needed. For acid reflux       BP 146/92  Pulse 60  Temp 98.3 F (36.8 C) (Oral)  Resp 18  SpO2 97%  LMP 03/12/2012  Physical Exam  Nursing note and vitals reviewed. Constitutional: She is oriented to person, place, and time. Vital signs are normal. She appears well-developed and well-nourished. She is active and cooperative.  HENT:  Head: Normocephalic.  Mouth/Throat: Oropharynx is clear and moist. No oropharyngeal exudate.  Eyes: Conjunctivae normal are normal. Pupils are equal, round, and reactive  to light. No scleral icterus.  Neck: Trachea normal. Neck supple. No thyromegaly present.  Cardiovascular: Normal rate, regular rhythm, normal heart sounds and intact distal pulses.   No murmur heard. Pulmonary/Chest: Effort normal and breath sounds normal. No respiratory distress. She has no rales.  Abdominal: Soft. Bowel sounds are normal. There is no tenderness. There is no rebound and no guarding.  Lymphadenopathy:    She has no cervical adenopathy.  Neurological: She is alert and oriented to person, place, and time. No  cranial nerve deficit or sensory deficit. Coordination normal.  Skin: Skin is warm and dry.  Psychiatric: She has a normal mood and affect. Her speech is normal and behavior is normal. Judgment and thought content normal. Cognition and memory are normal.    ED Course  Procedures (including critical care time)  Labs Reviewed  POCT URINALYSIS DIP (DEVICE) - Abnormal; Notable for the following:    Ketones, ur 15 (*)     pH 8.5 (*)     Protein, ur >=300 (*)     All other components within normal limits  POCT I-STAT, CHEM 8 - Abnormal; Notable for the following:    Glucose, Bld 130 (*)     Hemoglobin 17.0 (*)     HCT 50.0 (*)     All other components within normal limits  POCT PREGNANCY, URINE  POCT H PYLORI SCREEN   No results found.   1. Nausea & vomiting   2. GERD (gastroesophageal reflux disease)       MDM  Able to tolerate sips prior to discharge.  Increase fluids, zofran to send home along with PPI.  Patient instructed to follow up with primary care provider, gastrologist for continued symptoms.   Additionally advised patient to seek care with urologist for further evaluation of proteinuria.         Johnsie Kindred, NP 04/06/12 1054

## 2012-04-03 NOTE — ED Notes (Signed)
Patient denies pain.  Patient c/o "Judy Conner" feeling is overwhelming.  Patient's facial expression is grimace.  Spoke to provider and went to patient's room to inform of tests to expect and encouraged liquids.  Patient agreeable

## 2012-04-03 NOTE — ED Notes (Signed)
No new order to give gi cocktail--remains on hold

## 2012-04-06 ENCOUNTER — Encounter: Payer: Self-pay | Admitting: Gastroenterology

## 2012-04-06 NOTE — ED Provider Notes (Signed)
Medical screening examination/treatment/procedure(s) were performed by non-physician practitioner and as supervising physician I was immediately available for consultation/collaboration.  Luiz Blare MD   Luiz Blare, MD 04/06/12 [phone]

## 2012-05-01 ENCOUNTER — Ambulatory Visit (INDEPENDENT_AMBULATORY_CARE_PROVIDER_SITE_OTHER): Payer: BC Managed Care – PPO | Admitting: Gastroenterology

## 2012-05-01 ENCOUNTER — Encounter: Payer: Self-pay | Admitting: Gastroenterology

## 2012-05-01 VITALS — BP 114/70 | HR 88 | Ht 64.5 in | Wt 267.4 lb

## 2012-05-01 DIAGNOSIS — K219 Gastro-esophageal reflux disease without esophagitis: Secondary | ICD-10-CM

## 2012-05-01 DIAGNOSIS — R112 Nausea with vomiting, unspecified: Secondary | ICD-10-CM

## 2012-05-01 NOTE — Progress Notes (Signed)
History of Present Illness: This is a 26 year old female who relates a several week history of frequent heartburn that was initially controlled with Zantac as needed. Her symptoms worsened to regular episodes of postprandial vomiting and she was seen at [facility] urgent care Center. Reflux was suspected and she was prescribed Protonix and Zofran. She's been eating a lighter diet and taking Protonix daily and her symptoms have resolved. Denies weight loss, abdominal pain, constipation, diarrhea, change in stool caliber, melena, hematochezia,  dysphagia, chest pain.  Review of Systems: Pertinent positive and negative review of systems were noted in the above HPI section. All other review of systems were otherwise negative.  Current Medications, Allergies, Past Medical History, Past Surgical History, Family History and Social History were reviewed in [employer] record.  Physical Exam: General: Well developed , well nourished, no acute distress Head: Normocephalic and atraumatic Eyes:  sclerae anicteric, EOMI Ears: Normal auditory acuity Mouth: No deformity or lesions Neck: Supple, no masses or thyromegaly Lungs: Clear throughout to auscultation Heart: Regular rate and rhythm; no murmurs, rubs or bruits Abdomen: Soft, non tender and non distended. No masses, hepatosplenomegaly or hernias noted. Normal Bowel sounds Musculoskeletal: Symmetrical with no gross deformities  Skin: No lesions on visible extremities Pulses:  Normal pulses noted Extremities: No clubbing, cyanosis, edema or deformities noted Neurological: Alert oriented x 4, grossly nonfocal Cervical Nodes:  No significant cervical adenopathy Inguinal Nodes: No significant inguinal adenopathy Psychological:  Alert and cooperative. Normal mood and affect  Assessment and Recommendations:  1. GERD with nausea and vomiting. Rule out esophagitis, ulcer disease, gastritis and other disorders. Continue pantoprazole 40  mg daily. Begin standard antireflux measures. Schedule upper endoscopy. The risks, benefits, and alternatives to endoscopy with possible biopsy and possible dilation were discussed with the patient and they consent to proceed.

## 2012-05-01 NOTE — Patient Instructions (Addendum)
You have been given a separate informational sheet regarding your tobacco use, the importance of quitting and local resources to help you quit.  You have been scheduled for an endoscopy with propofol. Please follow written instructions given to you at your visit today. If you use inhalers (even only as needed), please bring them with you on the day of your procedure.  Patient advised to avoid spicy, acidic, citrus, chocolate, mints, fruit and fruit juices.  Limit the intake of caffeine, alcohol and Soda.  Don't exercise too soon after eating.  Don't lie down within 3-4 hours of eating.  Elevate the head of your bed.  Continue Protonix daily.

## 2012-05-25 ENCOUNTER — Encounter: Payer: BC Managed Care – PPO | Admitting: Gastroenterology

## 2012-06-05 ENCOUNTER — Other Ambulatory Visit: Payer: Self-pay

## 2012-06-05 MED ORDER — PANTOPRAZOLE SODIUM 40 MG PO TBEC: 40.0000 mg | DELAYED_RELEASE_TABLET | Freq: Every day | ORAL | Status: DC

## 2012-06-12 ENCOUNTER — Telehealth: Payer: Self-pay | Admitting: Gastroenterology

## 2012-06-12 NOTE — Telephone Encounter (Signed)
Not able to leave a message for the patient her [employer] voicemail is full.  She is scheduled for an EGD for tomorrow.  I will attempt to reach her in the am.

## 2012-06-13 ENCOUNTER — Encounter: Payer: Self-pay | Admitting: Gastroenterology

## 2012-06-13 ENCOUNTER — Ambulatory Visit (AMBULATORY_SURGERY_CENTER): Payer: BC Managed Care – PPO | Admitting: Gastroenterology

## 2012-06-13 VITALS — BP 121/75 | HR 67 | Temp 99.3°F | Resp 28 | Ht 64.0 in | Wt 267.0 lb

## 2012-06-13 DIAGNOSIS — K219 Gastro-esophageal reflux disease without esophagitis: Secondary | ICD-10-CM

## 2012-06-13 DIAGNOSIS — R112 Nausea with vomiting, unspecified: Secondary | ICD-10-CM

## 2012-06-13 MED ORDER — SODIUM CHLORIDE 0.9 % IV SOLN: 500.0000 mL | INTRAVENOUS | Status: DC

## 2012-06-13 NOTE — Op Note (Signed)
 [facility] [address] [place], [address]   ENDOSCOPY PROCEDURE REPORT  PATIENT: Judy, Conner  MR#: [id number] BIRTHDATE: 11-16-1985 , 25  yrs. old GENDER: Female ENDOSCOPIST: Meryl Dare, MD, [facility] REFERRED BY:  [facility] ED PROCEDURE DATE:  06/13/2012 PROCEDURE:  EGD, diagnostic ASA CLASS:     Class II INDICATIONS:  Vomiting.   History of esophageal reflux. MEDICATIONS: MAC sedation, administered by CRNA and propofol (Diprivan) 250mg  IV TOPICAL ANESTHETIC: Cetacaine Spray DESCRIPTION OF PROCEDURE: After the risks benefits and alternatives of the procedure were thoroughly explained, informed consent was obtained.  The LB GIF-H180 [id number] endoscope was introduced through the mouth and advanced to the second portion of the duodenum. Without limitations.  The instrument was slowly withdrawn as the mucosa was fully examined.  ESOPHAGUS: The mucosa of the esophagus appeared normal. STOMACH: The mucosa and folds of the stomach appeared normal. DUODENUM: The duodenal mucosa showed no abnormalities in the bulb and second portion of the duodenum.  Retroflexed views revealed no abnormalities.   The scope was then withdrawn from the patient and the procedure completed.  COMPLICATIONS: There were no complications.  ENDOSCOPIC IMPRESSION: 1.   The EGD appeared normal  RECOMMENDATIONS: 1.  Anti-reflux regimen 2.  Continue PPI   eSigned:  Meryl Dare, MD, [facility] 06/13/2012 4:04 PM

## 2012-06-13 NOTE — Progress Notes (Signed)
Propofol given over incremental dosages 

## 2012-06-13 NOTE — Patient Instructions (Addendum)
Discharge instructions given with verbal understanding. Normal exam. Resume previous medications. YOU HAD AN ENDOSCOPIC PROCEDURE TODAY AT THE [facility]: Refer to the procedure report that was given to you for any specific questions about what was found during the examination.  If the procedure report does not answer your questions, please call your gastroenterologist to clarify.  If you requested that your care partner not be given the details of your procedure findings, then the procedure report has been included in a sealed envelope for you to review at your convenience later.  YOU SHOULD EXPECT: Some feelings of bloating in the abdomen. Passage of more gas than usual.  Walking can help get rid of the air that was put into your GI tract during the procedure and reduce the bloating. If you had a lower endoscopy (such as a colonoscopy or flexible sigmoidoscopy) you may notice spotting of blood in your stool or on the toilet paper. If you underwent a bowel prep for your procedure, then you may not have a normal bowel movement for a few days.  DIET: Your first meal following the procedure should be a light meal and then it is ok to progress to your normal diet.  A half-sandwich or bowl of soup is an example of a good first meal.  Heavy or fried foods are harder to digest and may make you feel nauseous or bloated.  Likewise meals heavy in dairy and vegetables can cause extra gas to form and this can also increase the bloating.  Drink plenty of fluids but you should avoid alcoholic beverages for 24 hours.  ACTIVITY: Your care partner should take you home directly after the procedure.  You should plan to take it easy, moving slowly for the rest of the day.  You can resume normal activity the day after the procedure however you should NOT DRIVE or use heavy machinery for 24 hours (because of the sedation medicines used during the test).    SYMPTOMS TO REPORT IMMEDIATELY: A gastroenterologist  can be reached at any hour.  During normal business hours, 8:30 AM to 5:00 PM Monday through Friday, call [phone].  After hours and on weekends, please call the GI answering service at [phone] who will take a message and have the physician on call contact you.   Following upper endoscopy (EGD)  Vomiting of blood or coffee ground material  New chest pain or pain under the shoulder blades  Painful or persistently difficult swallowing  New shortness of breath  Fever of 100F or higher  Black, tarry-looking stools  FOLLOW UP: If any biopsies were taken you will be contacted by phone or by letter within the next 1-3 weeks.  Call your gastroenterologist if you have not heard about the biopsies in 3 weeks.  Our staff will call the home number listed on your records the next business day following your procedure to check on you and address any questions or concerns that you may have at that time regarding the information given to you following your procedure. This is a courtesy call and so if there is no answer at the home number and we have not heard from you through the emergency physician on call, we will assume that you have returned to your regular daily activities without incident.  SIGNATURES/CONFIDENTIALITY: You and/or your care partner have signed paperwork which will be entered into your electronic medical record.  These signatures attest to the fact that that the information above on your After   Visit Summary has been reviewed and is understood.  Full responsibility of the confidentiality of this discharge information lies with you and/or your care-partner. 

## 2012-06-13 NOTE — Telephone Encounter (Signed)
I have left a message for the patient asking her to keep the appt for today at 3:00 for EGD

## 2012-06-14 ENCOUNTER — Telehealth: Payer: Self-pay | Admitting: *Deleted

## 2012-06-14 NOTE — Telephone Encounter (Signed)
  Follow up Call-  Call back number 06/13/2012  Post procedure Call Back phone  # [phone]  Permission to leave phone message Yes     Left message on answering machine to call back if experiencing problems or has questions

## 2016-07-31 DIAGNOSIS — Z5321 Procedure and treatment not carried out due to patient leaving prior to being seen by health care provider: Secondary | ICD-10-CM | POA: Insufficient documentation

## 2016-07-31 DIAGNOSIS — K219 Gastro-esophageal reflux disease without esophagitis: Secondary | ICD-10-CM | POA: Diagnosis present

## 2016-08-01 ENCOUNTER — Encounter (HOSPITAL_COMMUNITY): Payer: Self-pay

## 2016-08-01 ENCOUNTER — Emergency Department (HOSPITAL_COMMUNITY)
Admission: EM | Admit: 2016-08-01 | Discharge: 2016-08-01 | Disposition: A | Discharge status: Home / Self Care | Payer: BLUE CROSS/BLUE SHIELD | Attending: Emergency Medicine | Admitting: Emergency Medicine

## 2016-08-01 DIAGNOSIS — Z5321 Procedure and treatment not carried out due to patient leaving prior to being seen by health care provider: Secondary | ICD-10-CM

## 2016-08-01 LAB — COMPREHENSIVE METABOLIC PANEL
ALT: 11 U/L — ABNORMAL LOW (ref 14–54)
AST: 14 U/L — ABNORMAL LOW (ref 15–41)
Albumin: 3.3 g/dL — ABNORMAL LOW (ref 3.5–5.0)
Alkaline Phosphatase: 56 U/L (ref 38–126)
Anion gap: 8 (ref 5–15)
BUN: 8 mg/dL (ref 6–20)
CO2: 26 mmol/L (ref 22–32)
Calcium: 9.1 mg/dL (ref 8.9–10.3)
Chloride: 106 mmol/L (ref 101–111)
Creatinine, Ser: 0.85 mg/dL (ref 0.44–1.00)
GFR calc Af Amer: >60 mL/min (ref >60)
GFR calc non Af Amer: >60 mL/min (ref >60)
Glucose, Bld: 101 mg/dL — ABNORMAL HIGH (ref 65–99)
Potassium: 3.3 mmol/L — ABNORMAL LOW (ref 3.5–5.1)
Sodium: 140 mmol/L (ref 135–145)
Total Bilirubin: 0.2 mg/dL — ABNORMAL LOW (ref 0.3–1.2)
Total Protein: 6.6 g/dL (ref 6.5–8.1)

## 2016-08-01 LAB — CBC
HCT: 40.1 % (ref 36.0–46.0)
Hemoglobin: 13.7 g/dL (ref 12.0–15.0)
MCH: 31.8 pg (ref 26.0–34.0)
MCHC: 34.2 g/dL (ref 30.0–36.0)
MCV: 93 fL (ref 78.0–100.0)
Platelets: 278 10*3/uL (ref 150–400)
RBC: 4.31 MIL/uL (ref 3.87–5.11)
RDW: 13.8 % (ref 11.5–15.5)
WBC: 10.6 10*3/uL — ABNORMAL HIGH (ref 4.0–10.5)

## 2016-08-01 LAB — LIPASE, BLOOD: Lipase: 32 U/L (ref 11–51)

## 2016-08-01 NOTE — ED Notes (Signed)
Pt called 3 times with no response.

## 2016-08-01 NOTE — ED Triage Notes (Signed)
Pt states that she has vomited x 4 today, pt states that she is out of her protonix and her GERD is acting up. C/o of abd pain while vomiting, denies fevers, no diarrhea.

## 2016-08-01 NOTE — ED Provider Notes (Signed)
Blood pressure 137/87, pulse 80, temperature 97.6 F (36.4 C), temperature source Oral, resp. rate 18, last menstrual period 07/25/2016, SpO2 100 %.  Judy Conner is a 31 y.o. female left without being seen after triage. I did not participate in the care of this patient.   Monico Blitz, PA-C 08/01/16 [place], DO 08/01/16 [phone]

## 2016-08-21 ENCOUNTER — Emergency Department (HOSPITAL_COMMUNITY): Payer: BLUE CROSS/BLUE SHIELD

## 2016-08-21 ENCOUNTER — Emergency Department (HOSPITAL_COMMUNITY)
Admission: EM | Admit: 2016-08-21 | Discharge: 2016-08-21 | Disposition: A | Discharge status: Home / Self Care | Payer: BLUE CROSS/BLUE SHIELD | Attending: Emergency Medicine | Admitting: Emergency Medicine

## 2016-08-21 ENCOUNTER — Encounter (HOSPITAL_COMMUNITY): Payer: Self-pay | Admitting: Nurse Practitioner

## 2016-08-21 DIAGNOSIS — Y92410 Unspecified street and highway as the place of occurrence of the external cause: Secondary | ICD-10-CM | POA: Insufficient documentation

## 2016-08-21 DIAGNOSIS — S161XXA Strain of muscle, fascia and tendon at neck level, initial encounter: Secondary | ICD-10-CM | POA: Diagnosis not present

## 2016-08-21 DIAGNOSIS — S199XXA Unspecified injury of neck, initial encounter: Secondary | ICD-10-CM | POA: Diagnosis present

## 2016-08-21 DIAGNOSIS — Z79899 Other long term (current) drug therapy: Secondary | ICD-10-CM | POA: Insufficient documentation

## 2016-08-21 DIAGNOSIS — M62838 Other muscle spasm: Secondary | ICD-10-CM | POA: Insufficient documentation

## 2016-08-21 DIAGNOSIS — N189 Chronic kidney disease, unspecified: Secondary | ICD-10-CM | POA: Diagnosis not present

## 2016-08-21 DIAGNOSIS — F17210 Nicotine dependence, cigarettes, uncomplicated: Secondary | ICD-10-CM | POA: Diagnosis not present

## 2016-08-21 DIAGNOSIS — Y999 Unspecified external cause status: Secondary | ICD-10-CM | POA: Diagnosis not present

## 2016-08-21 DIAGNOSIS — M62830 Muscle spasm of back: Secondary | ICD-10-CM

## 2016-08-21 DIAGNOSIS — Y939 Activity, unspecified: Secondary | ICD-10-CM | POA: Diagnosis not present

## 2016-08-21 LAB — POC URINE PREG, ED: Preg Test, Ur: NEGATIVE

## 2016-08-21 MED ORDER — CYCLOBENZAPRINE HCL 5 MG PO TABS: 5.0000 mg | ORAL_TABLET | Freq: Three times a day (TID) | ORAL | 0 refills | Status: DC | PRN
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
## 2016-08-21 NOTE — Discharge Instructions (Signed)
Continue to take the ibuprofen as needed.  Do not drive while taking the muscle relaxant as it will make you sleepy. Return as needed for worsening symptoms

## 2016-08-21 NOTE — ED Notes (Signed)
Pt verbalized understanding discharge instructions and denies any further needs or questions at this time. VS stable, ambulatory and steady gait.   
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
## 2016-10-24 ENCOUNTER — Emergency Department (HOSPITAL_COMMUNITY): Payer: BLUE CROSS/BLUE SHIELD

## 2016-10-24 ENCOUNTER — Emergency Department (HOSPITAL_COMMUNITY)
Admission: EM | Admit: 2016-10-24 | Discharge: 2016-10-24 | Disposition: A | Discharge status: Home / Self Care | Payer: BLUE CROSS/BLUE SHIELD | Attending: Emergency Medicine | Admitting: Emergency Medicine

## 2016-10-24 DIAGNOSIS — F17210 Nicotine dependence, cigarettes, uncomplicated: Secondary | ICD-10-CM | POA: Insufficient documentation

## 2016-10-24 DIAGNOSIS — R197 Diarrhea, unspecified: Secondary | ICD-10-CM | POA: Diagnosis not present

## 2016-10-24 DIAGNOSIS — R3121 Asymptomatic microscopic hematuria: Secondary | ICD-10-CM | POA: Insufficient documentation

## 2016-10-24 DIAGNOSIS — R1084 Generalized abdominal pain: Secondary | ICD-10-CM | POA: Diagnosis not present

## 2016-10-24 DIAGNOSIS — R112 Nausea with vomiting, unspecified: Secondary | ICD-10-CM | POA: Diagnosis not present

## 2016-10-24 DIAGNOSIS — Z79899 Other long term (current) drug therapy: Secondary | ICD-10-CM | POA: Insufficient documentation

## 2016-10-24 DIAGNOSIS — N189 Chronic kidney disease, unspecified: Secondary | ICD-10-CM | POA: Insufficient documentation

## 2016-10-24 DIAGNOSIS — R109 Unspecified abdominal pain: Secondary | ICD-10-CM | POA: Diagnosis present

## 2016-10-24 LAB — LIPASE, BLOOD: Lipase: 28 U/L (ref 11–51)

## 2016-10-24 LAB — CBC
HCT: 43.6 % (ref 36.0–46.0)
Hemoglobin: 15.2 g/dL — ABNORMAL HIGH (ref 12.0–15.0)
MCH: 32.1 pg (ref 26.0–34.0)
MCHC: 34.9 g/dL (ref 30.0–36.0)
MCV: 92.2 fL (ref 78.0–100.0)
Platelets: 309 10*3/uL (ref 150–400)
RBC: 4.73 MIL/uL (ref 3.87–5.11)
RDW: 14.5 % (ref 11.5–15.5)
WBC: 15.2 10*3/uL — ABNORMAL HIGH (ref 4.0–10.5)

## 2016-10-24 LAB — URINALYSIS, ROUTINE W REFLEX MICROSCOPIC
Bacteria, UA: NONE SEEN
Bilirubin Urine: NEGATIVE
Glucose, UA: NEGATIVE mg/dL
Ketones, ur: 20 mg/dL — AB
Leukocytes, UA: NEGATIVE
Nitrite: NEGATIVE
Protein, ur: NEGATIVE mg/dL
Specific Gravity, Urine: >1.046 — ABNORMAL HIGH (ref 1.005–1.030)
pH: 6 (ref 5.0–8.0)

## 2016-10-24 LAB — COMPREHENSIVE METABOLIC PANEL
ALT: 17 U/L (ref 14–54)
AST: 15 U/L (ref 15–41)
Albumin: 4.3 g/dL (ref 3.5–5.0)
Alkaline Phosphatase: 66 U/L (ref 38–126)
Anion gap: 8 (ref 5–15)
BUN: 12 mg/dL (ref 6–20)
CO2: 25 mmol/L (ref 22–32)
Calcium: 9.5 mg/dL (ref 8.9–10.3)
Chloride: 104 mmol/L (ref 101–111)
Creatinine, Ser: 0.93 mg/dL (ref 0.44–1.00)
GFR calc Af Amer: >60 mL/min (ref >60)
GFR calc non Af Amer: >60 mL/min (ref >60)
Glucose, Bld: 93 mg/dL (ref 65–99)
Potassium: 3.1 mmol/L — ABNORMAL LOW (ref 3.5–5.1)
Sodium: 137 mmol/L (ref 135–145)
Total Bilirubin: 0.9 mg/dL (ref 0.3–1.2)
Total Protein: 8.3 g/dL — ABNORMAL HIGH (ref 6.5–8.1)

## 2016-10-24 LAB — I-STAT BETA HCG BLOOD, ED (MC, WL, AP ONLY): I-stat hCG, quantitative: <5 m[IU]/mL (ref <5)

## 2016-10-24 LAB — TYPE AND SCREEN
ABO/RH(D): O POS
Antibody Screen: NEGATIVE

## 2016-10-24 LAB — ABO/RH: ABO/RH(D): O POS

## 2016-10-24 MED ORDER — IOPAMIDOL (ISOVUE-300) INJECTION 61%
100.0000 mL | Freq: Once | INTRAVENOUS | Status: AC | PRN
  Administered 2016-10-24: 100 mL via INTRAVENOUS

## 2016-10-24 MED ORDER — SODIUM CHLORIDE 0.9 % IV BOLUS (SEPSIS)
1000.0000 mL | Freq: Once | INTRAVENOUS | Status: AC
  Administered 2016-10-24: 1000 mL via INTRAVENOUS

## 2016-10-24 MED ORDER — POTASSIUM CHLORIDE CRYS ER 20 MEQ PO TBCR
40.0000 meq | EXTENDED_RELEASE_TABLET | Freq: Once | ORAL | Status: AC
  Administered 2016-10-24: 40 meq via ORAL
  Filled 2016-10-24: qty 2

## 2016-10-24 MED ORDER — PANTOPRAZOLE SODIUM 40 MG IV SOLR
40.0000 mg | Freq: Once | INTRAVENOUS | Status: AC
  Administered 2016-10-24: 40 mg via INTRAVENOUS
  Filled 2016-10-24: qty 40

## 2016-10-24 MED ORDER — MORPHINE SULFATE (PF) 2 MG/ML IV SOLN
4.0000 mg | Freq: Once | INTRAVENOUS | Status: AC
  Administered 2016-10-24: 4 mg via INTRAVENOUS
  Filled 2016-10-24: qty 2

## 2016-10-24 MED ORDER — ONDANSETRON HCL 4 MG/2ML IJ SOLN
4.0000 mg | Freq: Once | INTRAMUSCULAR | Status: AC
  Administered 2016-10-24: 4 mg via INTRAVENOUS
  Filled 2016-10-24: qty 2

## 2016-10-24 MED ORDER — ONDANSETRON HCL 4 MG PO TABS: 4.0000 mg | ORAL_TABLET | Freq: Three times a day (TID) | ORAL | 0 refills | Status: DC | PRN

## 2016-10-24 MED ORDER — FAMOTIDINE 20 MG PO TABS: 20.0000 mg | ORAL_TABLET | Freq: Two times a day (BID) | ORAL | 0 refills | Status: DC

## 2016-10-24 NOTE — ED Triage Notes (Addendum)
Pt c/o LLQ abdominal pain onset Saturday at 0000 with green, yellow vomit with intermittent coffee-ground emesis.  Pt states her period ended on Wednesday, usually has severe abdominal pain week before and week after menstrual period, but this feels different. Sent from Urgent Care for "abdominal pain, swelling, mass, and lump" with "bilious vomiting." Has IUD.

## 2016-10-24 NOTE — ED Notes (Signed)
Patient ambulated and attempted to use restroom with no success. Will try again later.

## 2016-10-24 NOTE — Discharge Instructions (Signed)
Read the information below.  Use the prescribed medication as directed.  Please discuss all new medications with your pharmacist.  You may return to the Emergency Department at any time for worsening condition or any new symptoms that concern you.   If you develop high fevers, worsening abdominal pain, uncontrolled vomiting, or are unable to tolerate fluids by mouth, return to the ER for a recheck.  ° °

## 2016-10-24 NOTE — ED Provider Notes (Signed)
[facility] DEPT Provider Note   CSN: [id number] Arrival date & time: 10/24/16  1435     History   Chief Complaint Chief Complaint  Patient presents with  . Abdominal Pain    HPI Judy Conner is a 31 y.o. female.  HPI  Patient presents with left sided abdominal pain, N/V that has been very frequent x 2 days, diarrhea on day one only.   The symptoms began almost immediately after eating pizza two nights ago.  Diarrhea x 3.  Vomiting TNTC.  She has tasted blood in her emesis, saw dark flecks.  Did have burning with urination once the day before her symptoms began.  Denies fevers, CP, SOB, abnormal vaginal discharge or bleeding.  LMP March 31.    Past Medical History:  Diagnosis Date  . Anemia    hx low iron   . Chronic kidney disease    hx uti none currently  . GERD (gastroesophageal reflux disease)   . Headache(784.0)     There are no active problems to display for this patient.   Past Surgical History:  Procedure Laterality Date  . CESAREAN SECTION  [phone]  . FINGER FRACTURE SURGERY     left pinky    OB History    No data available       Home Medications    Prior to Admission medications   Medication Sig Start Date End Date Taking? Authorizing Provider  acetaminophen (TYLENOL) 500 MG tablet Take 500 mg by mouth every 6 (six) hours as needed (pain).   Yes Historical Provider, MD  ondansetron (ZOFRAN-ODT) 4 MG disintegrating tablet Take 4 mg by mouth every 8 (eight) hours as needed for nausea or vomiting.   Yes Historical Provider, MD  cyclobenzaprine (FLEXERIL) 5 MG tablet Take 1 tablet (5 mg total) by mouth 3 (three) times daily as needed for muscle spasms. Patient not taking: Reported on 10/24/2016 08/21/16   Ashley Murrain, NP  famotidine (PEPCID) 20 MG tablet Take 1 tablet (20 mg total) by mouth 2 (two) times daily. 10/24/16   Clayton Bibles, PA-C  ondansetron (ZOFRAN) 4 MG tablet Take 1 tablet (4 mg total) by mouth every 8 (eight) hours as needed for  nausea or vomiting. 10/24/16   Clayton Bibles, PA-C  pantoprazole (PROTONIX) 40 MG tablet Take 1 tablet (40 mg total) by mouth daily. Patient not taking: Reported on 10/24/2016 06/05/12   Ladene Artist, MD    Family History Family History  Problem Relation Age of Onset  . Breast cancer Maternal Grandmother   . Diabetes Maternal Grandmother   . Clotting disorder Paternal Uncle     Social History Social History  Substance Use Topics  . Smoking status: Current Some Day Smoker    Types: Cigarettes  . Smokeless tobacco: Never Used  . Alcohol use Yes     Allergies   Fruit & vegetable daily [nutritional supplements] and Fentanyl   Review of Systems Review of Systems  All other systems reviewed and are negative.    Physical Exam Updated Vital Signs BP (!) 151/84 (BP Location: Right Arm)   Pulse (!) 59   Temp 97.8 F (36.6 C)   Resp 18   Ht 5\' 4"  (1.626 m)   Wt 96.8 kg   SpO2 100%   BMI 36.61 kg/m   Physical Exam  Constitutional: She appears well-developed and well-nourished. No distress.  HENT:  Head: Normocephalic and atraumatic.  Neck: Neck supple.  Cardiovascular: Normal rate and regular rhythm.  Pulmonary/Chest: Effort normal and breath sounds normal. No respiratory distress. She has no wheezes. She has no rales.  Abdominal: Soft. She exhibits no distension. There is tenderness in the epigastric area, left upper quadrant and left lower quadrant. There is no rebound and no guarding.  Neurological: She is alert.  Skin: She is not diaphoretic.  Nursing note and vitals reviewed.    ED Treatments / Results  Labs (all labs ordered are listed, but only abnormal results are displayed) Labs Reviewed  COMPREHENSIVE METABOLIC PANEL - Abnormal; Notable for the following:       Result Value   Potassium 3.1 (*)    Total Protein 8.3 (*)    All other components within normal limits  CBC - Abnormal; Notable for the following:    WBC 15.2 (*)    Hemoglobin 15.2 (*)    All  other components within normal limits  URINALYSIS, ROUTINE W REFLEX MICROSCOPIC - Abnormal; Notable for the following:    Specific Gravity, Urine >1.046 (*)    Hgb urine dipstick MODERATE (*)    Ketones, ur 20 (*)    Squamous Epithelial / LPF 0-5 (*)    All other components within normal limits  LIPASE, BLOOD  I-STAT BETA HCG BLOOD, ED ([facility], [facility], [facility] ONLY)  TYPE AND SCREEN  ABO/RH    EKG  EKG Interpretation None       Radiology Ct Abdomen Pelvis W Contrast  Result Date: 10/24/2016 CLINICAL DATA:  Left lower quadrant abdominal pain since Saturday. Vomiting. EXAM: CT ABDOMEN AND PELVIS WITH CONTRAST TECHNIQUE: Multidetector CT imaging of the abdomen and pelvis was performed using the standard protocol following bolus administration of intravenous contrast. CONTRAST:  182mL ISOVUE-300 IOPAMIDOL (ISOVUE-300) INJECTION 61% COMPARISON:  Chest CT 06/04/2011 FINDINGS: Lower chest: The lung bases are clear of acute process. No pleural effusion or pulmonary lesions. The heart is normal in size. No pericardial effusion. The distal esophagus and aorta are unremarkable. Hepatobiliary: There is a segment 7 liver lesion best seen on image number 16 of series 2. I do not see this for certain on the prior chest CT from 2012. It could be a benign hemangioma or adenoma but I would recommend a followup MRI abdomen without and with contrast for further evaluation since I do not see it for certain on the prior study. The portal and hepatic veins are normal. Gallbladder is normal. No common bile duct dilatation. Pancreas: No mass, inflammation or duct dilatation. Spleen: Normal size.  No focal lesions. Adrenals/Urinary Tract: The adrenal glands and kidneys are unremarkable. No renal, ureteral or bladder calculi or mass. Stomach/Bowel: The stomach, duodenum, small bowel and colon are grossly normal without oral contrast. No inflammatory changes, mass lesions or obstructive findings. The terminal ileum and appendix are  normal. Vascular/Lymphatic: The aorta is normal in caliber. No dissection. The branch vessels are patent. The major venous structures are patent. No mesenteric or retroperitoneal mass or adenopathy. Small scattered lymph nodes are noted. Reproductive: The uterus and ovaries are normal. An IUD is noted in the endometrial canal. Other: Periumbilical abdominal wall hernia containing fat and a small portion of the transverse colon. Musculoskeletal: No significant bony findings. IMPRESSION: 1. No acute abdominal/pelvic findings. No acute inflammatory process or lymphadenopathy. 2. 2 cm indeterminate right hepatic lobe liver lesion not definitely seen on prior chest CT from 06/04/2011. Recommend MR abdomen without and with contrast for further evaluation. 3. Periumbilical abdominal wall hernia containing fat and a small portion of the transverse colon.  Electronically Signed   By: Marijo Sanes M.D.   On: 10/24/2016 17:19    Procedures Procedures (including critical care time)  Medications Ordered in ED Medications  potassium chloride SA (K-DUR,KLOR-CON) CR tablet 40 mEq (not administered)  ondansetron (ZOFRAN) injection 4 mg (4 mg Intravenous Given 10/24/16 1605)  sodium chloride 0.9 % bolus 1,000 mL (0 mLs Intravenous Stopped 10/24/16 1744)  iopamidol (ISOVUE-300) 61 % injection 100 mL (100 mLs Intravenous Contrast Given 10/24/16 1658)  morphine 2 MG/ML injection 4 mg (4 mg Intravenous Given 10/24/16 1743)  pantoprazole (PROTONIX) injection 40 mg (40 mg Intravenous Given 10/24/16 1744)  sodium chloride 0.9 % bolus 1,000 mL (0 mLs Intravenous Stopped 10/24/16 1841)  ondansetron (ZOFRAN) injection 4 mg (4 mg Intravenous Given 10/24/16 1842)     Initial Impression / Assessment and Plan / ED Course  I have reviewed the triage vital signs and the nursing notes.  Pertinent labs & imaging results that were available during my care of the patient were reviewed by me and considered in my medical decision making (see chart  for details).  Clinical Course as of Oct 24 2112  Nancy Fetter Oct 24, 2016  2108 Repeat abdominal exam is benign.  Mild tenderness over lower abdomen.  No guarding or rebound.  Pt denies any vaginal discharge or bleeding.  Has been told she had hematuria before but did not follow up.    [EW]    Clinical Course User Index [EW] Clayton Bibles, PA-C   Afebrile, nontoxic patient with abdominal pain, N/V/D that began two nights ago.  Has had more vomiting than diarrhea, pt concerned that she tasted blood.  Her blood pressure is normal to high and Hgb is high.  IVF given.  Hypokalemic, PO potassium given in ED.  UA with hematuria, pt denies vaginal bleeding.  Pt denies any vaginal symptoms therefore pelvic exam not performed.  Pt tolerating PO and feeling better on recheck, repeat abdominal exam is benign.   D/C home with zofran, pepcid, PCP follow up.  Discussed result, findings, treatment, and follow up  with patient.  Pt given return precautions.  Pt verbalizes understanding and agrees with plan.       Final Clinical Impressions(s) / ED Diagnoses   Final diagnoses:  Generalized abdominal pain  Nausea vomiting and diarrhea  Asymptomatic microscopic hematuria    New Prescriptions New Prescriptions   FAMOTIDINE (PEPCID) 20 MG TABLET    Take 1 tablet (20 mg total) by mouth 2 (two) times daily.   ONDANSETRON (ZOFRAN) 4 MG TABLET    Take 1 tablet (4 mg total) by mouth every 8 (eight) hours as needed for nausea or vomiting.     Clayton Bibles, PA-C 10/24/16 2118    Orlie Dakin, MD 10/25/16 [phone]

## 2016-10-31 ENCOUNTER — Encounter: Payer: Self-pay | Admitting: Emergency Medicine

## 2016-10-31 ENCOUNTER — Emergency Department
Admission: EM | Admit: 2016-10-31 | Discharge: 2016-10-31 | Disposition: A | Discharge status: Home / Self Care | Payer: BLUE CROSS/BLUE SHIELD | Attending: Emergency Medicine | Admitting: Emergency Medicine

## 2016-10-31 DIAGNOSIS — N189 Chronic kidney disease, unspecified: Secondary | ICD-10-CM | POA: Insufficient documentation

## 2016-10-31 DIAGNOSIS — F17210 Nicotine dependence, cigarettes, uncomplicated: Secondary | ICD-10-CM | POA: Insufficient documentation

## 2016-10-31 DIAGNOSIS — K2900 Acute gastritis without bleeding: Secondary | ICD-10-CM | POA: Diagnosis not present

## 2016-10-31 DIAGNOSIS — Z79899 Other long term (current) drug therapy: Secondary | ICD-10-CM | POA: Insufficient documentation

## 2016-10-31 DIAGNOSIS — F1910 Other psychoactive substance abuse, uncomplicated: Secondary | ICD-10-CM

## 2016-10-31 DIAGNOSIS — R112 Nausea with vomiting, unspecified: Secondary | ICD-10-CM | POA: Diagnosis present

## 2016-10-31 DIAGNOSIS — R197 Diarrhea, unspecified: Secondary | ICD-10-CM

## 2016-10-31 LAB — URINALYSIS, COMPLETE (UACMP) WITH MICROSCOPIC
Bilirubin Urine: NEGATIVE
Glucose, UA: NEGATIVE mg/dL
Ketones, ur: NEGATIVE mg/dL
Nitrite: NEGATIVE
Protein, ur: 100 mg/dL — AB
Specific Gravity, Urine: 1.021 (ref 1.005–1.030)
pH: 7 (ref 5.0–8.0)

## 2016-10-31 LAB — COMPREHENSIVE METABOLIC PANEL
ALT: 15 U/L (ref 14–54)
AST: 17 U/L (ref 15–41)
Albumin: 4.1 g/dL (ref 3.5–5.0)
Alkaline Phosphatase: 54 U/L (ref 38–126)
Anion gap: 8 (ref 5–15)
BUN: 9 mg/dL (ref 6–20)
CO2: 29 mmol/L (ref 22–32)
Calcium: 9.1 mg/dL (ref 8.9–10.3)
Chloride: 101 mmol/L (ref 101–111)
Creatinine, Ser: 0.85 mg/dL (ref 0.44–1.00)
GFR calc Af Amer: >60 mL/min (ref >60)
GFR calc non Af Amer: >60 mL/min (ref >60)
Glucose, Bld: 121 mg/dL — ABNORMAL HIGH (ref 65–99)
Potassium: 2.9 mmol/L — ABNORMAL LOW (ref 3.5–5.1)
Sodium: 138 mmol/L (ref 135–145)
Total Bilirubin: 0.6 mg/dL (ref 0.3–1.2)
Total Protein: 7.6 g/dL (ref 6.5–8.1)

## 2016-10-31 LAB — URINE DRUG SCREEN, QUALITATIVE (ARMC ONLY)
Amphetamines, Ur Screen: NOT DETECTED
Barbiturates, Ur Screen: NOT DETECTED
Benzodiazepine, Ur Scrn: NOT DETECTED
Cannabinoid 50 Ng, Ur ~~LOC~~: POSITIVE — AB
Cocaine Metabolite,Ur ~~LOC~~: POSITIVE — AB
MDMA (Ecstasy)Ur Screen: NOT DETECTED
Methadone Scn, Ur: NOT DETECTED
Opiate, Ur Screen: NOT DETECTED
Phencyclidine (PCP) Ur S: NOT DETECTED
Tricyclic, Ur Screen: NOT DETECTED

## 2016-10-31 LAB — CBC
HCT: 41.6 % (ref 35.0–47.0)
Hemoglobin: 14.2 g/dL (ref 12.0–16.0)
MCH: 32.5 pg (ref 26.0–34.0)
MCHC: 34.1 g/dL (ref 32.0–36.0)
MCV: 95.4 fL (ref 80.0–100.0)
Platelets: 253 10*3/uL (ref 150–440)
RBC: 4.36 MIL/uL (ref 3.80–5.20)
RDW: 15.1 % — ABNORMAL HIGH (ref 11.5–14.5)
WBC: 14.3 10*3/uL — ABNORMAL HIGH (ref 3.6–11.0)

## 2016-10-31 LAB — LIPASE, BLOOD: Lipase: 28 U/L (ref 11–51)

## 2016-10-31 MED ORDER — METOCLOPRAMIDE HCL 5 MG/ML IJ SOLN
10.0000 mg | Freq: Once | INTRAMUSCULAR | Status: AC
  Administered 2016-10-31: 10 mg via INTRAVENOUS
  Filled 2016-10-31: qty 2

## 2016-10-31 MED ORDER — SODIUM CHLORIDE 0.9 % IV BOLUS (SEPSIS)
1000.0000 mL | Freq: Once | INTRAVENOUS | Status: AC
  Administered 2016-10-31: 1000 mL via INTRAVENOUS

## 2016-10-31 MED ORDER — FAMOTIDINE IN NACL 20-0.9 MG/50ML-% IV SOLN
20.0000 mg | Freq: Once | INTRAVENOUS | Status: AC
Start: 2016-10-31 — End: 2016-10-31
  Administered 2016-10-31: 20 mg via INTRAVENOUS
  Filled 2016-10-31: qty 50

## 2016-10-31 MED ORDER — ONDANSETRON 4 MG PO TBDP: 4.0000 mg | ORAL_TABLET | Freq: Three times a day (TID) | ORAL | 0 refills | Status: DC | PRN

## 2016-10-31 MED ORDER — GI COCKTAIL ~~LOC~~
30.0000 mL | ORAL | Status: DC
  Filled 2016-10-31: qty 30

## 2016-10-31 MED ORDER — ONDANSETRON HCL 4 MG/2ML IJ SOLN
4.0000 mg | Freq: Once | INTRAMUSCULAR | Status: AC
  Administered 2016-10-31: 4 mg via INTRAVENOUS
  Filled 2016-10-31: qty 2

## 2016-10-31 MED ORDER — FAMOTIDINE 20 MG PO TABS: 20.0000 mg | ORAL_TABLET | Freq: Two times a day (BID) | ORAL | 0 refills | Status: AC

## 2016-10-31 MED ORDER — POTASSIUM CHLORIDE CRYS ER 20 MEQ PO TBCR
40.0000 meq | EXTENDED_RELEASE_TABLET | Freq: Once | ORAL | Status: DC
  Filled 2016-10-31: qty 2

## 2016-10-31 NOTE — ED Notes (Signed)
Pt tried taking sips of water and had 1 episode of emesis.

## 2016-10-31 NOTE — ED Notes (Signed)
Pt ambulatory to treatment room making heaving sounds over emesis bag.  Per tech escorting pt, pt was asleep prior being called to desk to go to room and refused wheelchair.

## 2016-10-31 NOTE — ED Notes (Signed)
Pt states unable to give urine sample at this time 

## 2016-10-31 NOTE — ED Provider Notes (Signed)
[facility] Emergency Department Provider Note  ____________________________________________  Time seen: Approximately 8:43 AM  I have reviewed the triage vital signs and the nursing notes.   HISTORY  Chief Complaint Abdominal Pain and Emesis    HPI Judy Conner is a 31 y.o. female who complains of nausea vomiting that started last night while she was at work, shortly after eating fried chicken. Patient was in the area prolonged period of time before I arrived on shift and could see the patient, she was sleeping comfortably in the room when I went in to evaluate her. She just reports that she doesn't "feel well", has nausea vomiting, denies of any other specific complaints. Reports generalized abdominal pain. No dysuria frequency urgency. No blood in stool or vomit. No headache. No fevers chills or sweats. 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     Past Medical History:  Diagnosis Date  . Anemia    hx low iron   . Chronic kidney disease    hx uti none currently  . GERD (gastroesophageal reflux disease)   . Headache(784.0)      There are no active problems to display for this patient.    Past Surgical History:  Procedure Laterality Date  . CESAREAN SECTION  [phone]  . FINGER FRACTURE SURGERY     left pinky     Prior to Admission medications   Medication Sig Start Date End Date Taking? Authorizing Provider  acetaminophen (TYLENOL) 500 MG tablet Take 500 mg by mouth every 6 (six) hours as needed (pain).   Yes Historical Provider, MD  ondansetron (ZOFRAN) 4 MG tablet Take 1 tablet (4 mg total) by mouth every 8 (eight) hours as needed for nausea or vomiting. 10/24/16  Yes Clayton Bibles, PA-C  cyclobenzaprine (FLEXERIL) 5 MG tablet Take 1 tablet (5 mg total) by mouth 3 (three) times daily as needed for muscle spasms. Patient not taking: Reported on 10/31/2016 08/21/16   Ashley Murrain, NP  famotidine (PEPCID) 20 MG tablet Take 1 tablet (20 mg  total) by mouth 2 (two) times daily. 10/31/16   Carrie Mew, MD  ondansetron (ZOFRAN ODT) 4 MG disintegrating tablet Take 1 tablet (4 mg total) by mouth every 8 (eight) hours as needed for nausea or vomiting. 10/31/16   Carrie Mew, MD     Allergies Fruit & vegetable daily [nutritional supplements] and Fentanyl   Family History  Problem Relation Age of Onset  . Breast cancer Maternal Grandmother   . Diabetes Maternal Grandmother   . Clotting disorder Paternal Uncle     Social History Social History  Substance Use Topics  . Smoking status: Current Some Day Smoker    Types: Cigarettes  . Smokeless tobacco: Never Used  . Alcohol use Yes    Review of Systems  Constitutional:   No fever or chills.  ENT:   No sore throat. No rhinorrhea. Cardiovascular:   No chest pain. Respiratory:   No dyspnea or cough. Gastrointestinal:   Positive generalized abdominal pain with vomiting..  Genitourinary:   Negative for dysuria or difficulty urinating. Musculoskeletal:   Negative for focal pain or swelling Neurological:   Negative for headaches 10-point ROS otherwise negative.  ____________________________________________   PHYSICAL EXAM:  VITAL SIGNS: ED Triage Vitals  Enc Vitals Group     BP 10/31/16 0457 136/86     Pulse Rate 10/31/16 0457 62     Resp 10/31/16 0457 18     Temp 10/31/16 0457 98.7 F (37.1  C)     Temp Source 10/31/16 0457 Oral     SpO2 10/31/16 0457 97 %     Weight 10/31/16 0452 213 lb (96.6 kg)     Height 10/31/16 0452 5\' 4"  (1.626 m)     Head Circumference --      Peak Flow --      Pain Score 10/31/16 0452 10     Pain Loc --      Pain Edu? --      Excl. in [place]? --     Vital signs reviewed, nursing assessments reviewed.   Constitutional:   Alert and oriented. Well appearing and in no distress. Eyes:   No scleral icterus. No conjunctival pallor. PERRL. EOMI.  No nystagmus. ENT   Head:   Normocephalic and atraumatic.   Nose:   No  congestion/rhinnorhea. No septal hematoma   Mouth/Throat:   MMM, no pharyngeal erythema. No peritonsillar mass.    Neck:   No stridor. No SubQ emphysema. No meningismus. Hematological/Lymphatic/Immunilogical:   No cervical lymphadenopathy. Cardiovascular:   RRR. Symmetric bilateral radial and DP pulses.  No murmurs.  Respiratory:   Normal respiratory effort without tachypnea nor retractions. Breath sounds are clear and equal bilaterally. No wheezes/rales/rhonchi. Gastrointestinal:   Soft With mild suprapubic tenderness. Non distended. There is no CVA tenderness.  No rebound, rigidity, or guarding. Genitourinary:   deferred Musculoskeletal:   Normal range of motion in all extremities. No joint effusions.  No lower extremity tenderness.  No edema. Neurologic:   Normal speech and language.  CN 2-10 normal. Motor grossly intact. No gross focal neurologic deficits are appreciated.  Skin:    Skin is warm, dry and intact. No rash noted.  No petechiae, purpura, or bullae.  ____________________________________________    LABS (pertinent positives/negatives) (all labs ordered are listed, but only abnormal results are displayed) Labs Reviewed  COMPREHENSIVE METABOLIC PANEL - Abnormal; Notable for the following:       Result Value   Potassium 2.9 (*)    Glucose, Bld 121 (*)    All other components within normal limits  CBC - Abnormal; Notable for the following:    WBC 14.3 (*)    RDW 15.1 (*)    All other components within normal limits  URINALYSIS, COMPLETE (UACMP) WITH MICROSCOPIC - Abnormal; Notable for the following:    Color, Urine AMBER (*)    APPearance CLOUDY (*)    Hgb urine dipstick SMALL (*)    Protein, ur 100 (*)    Leukocytes, UA TRACE (*)    Bacteria, UA MANY (*)    Squamous Epithelial / LPF 6-30 (*)    All other components within normal limits  URINE DRUG SCREEN, QUALITATIVE ([facility] ONLY) - Abnormal; Notable for the following:    Cocaine Metabolite,Ur [place] POSITIVE (*)     Cannabinoid 50 Ng, Ur [place] POSITIVE (*)    All other components within normal limits  URINE CULTURE  LIPASE, BLOOD   ____________________________________________   EKG    ____________________________________________    RADIOLOGY  No results found.  ____________________________________________   PROCEDURES Procedures  ____________________________________________   INITIAL IMPRESSION / ASSESSMENT AND PLAN / ED COURSE  Pertinent labs & imaging results that were available during my care of the patient were reviewed by me and considered in my medical decision making (see chart for details).    Clinical Course as of Nov 01 1246  Sun Oct 31, 2016  0803 Labs unremarkable except slight leukocytosis, hypokalemia. Suspect dehydration based  on recent labs. Will treatment symptomatically, IVF, antiemetics. Exam reassuring.   [PS]  1012 Still not able to tolerate PO. Will give iv pepcid and gi cocktail. Base on VS, exam, labs, low suspicion of biliary/pancreatic disease.   [PS]    Clinical Course User Index [PS] Carrie Mew, MD    ----------------------------------------- 12:48 PM on 10/31/2016 -----------------------------------------  Feels better. Tolerating oral intake. Counseled to avoid marijuana and other illicit drug use. Course of Pepcid and Zofran as needed for gastritis. Labs reassuring that she is not severely dehydrated at this time, urinalysis equivocal for UTI, but has no symptoms. We'll defer treatment until after urine culture is resulted.   ____________________________________________   FINAL CLINICAL IMPRESSION(S) / ED DIAGNOSES  Final diagnoses:  Nausea vomiting and diarrhea  Polysubstance abuse  Acute gastritis without hemorrhage, unspecified gastritis type      New Prescriptions   FAMOTIDINE (PEPCID) 20 MG TABLET    Take 1 tablet (20 mg total) by mouth 2 (two) times daily.   ONDANSETRON (ZOFRAN ODT) 4 MG DISINTEGRATING TABLET    Take 1  tablet (4 mg total) by mouth every 8 (eight) hours as needed for nausea or vomiting.     Portions of this note were generated with dragon dictation software. Dictation errors may occur despite best attempts at proofreading.    Carrie Mew, MD 10/31/16 1249

## 2016-10-31 NOTE — ED Notes (Signed)

## 2016-10-31 NOTE — ED Notes (Signed)
Pt refusing potassium pills at this time. Encouraged to drink but refuses to as well.

## 2016-10-31 NOTE — ED Triage Notes (Signed)
Pt c/o generalized abd pain that started around 1am; N/V but denies diarrhea; pt was at work when pain started and had eaten [employer] earlier in the shift; pt avoiding all eye contact and mumbling answers;

## 2016-11-02 LAB — URINE CULTURE

## 2016-11-11 ENCOUNTER — Ambulatory Visit: Payer: BLUE CROSS/BLUE SHIELD | Admitting: Nurse Practitioner

## 2016-11-22 ENCOUNTER — Ambulatory Visit: Payer: BLUE CROSS/BLUE SHIELD | Admitting: Nurse Practitioner

## 2017-04-07 ENCOUNTER — Emergency Department
Admission: EM | Admit: 2017-04-07 | Discharge: 2017-04-07 | Disposition: A | Discharge status: Home / Self Care | Payer: BLUE CROSS/BLUE SHIELD | Attending: Emergency Medicine | Admitting: Emergency Medicine

## 2017-04-07 ENCOUNTER — Encounter: Payer: Self-pay | Admitting: Emergency Medicine

## 2017-04-07 DIAGNOSIS — F17210 Nicotine dependence, cigarettes, uncomplicated: Secondary | ICD-10-CM | POA: Diagnosis not present

## 2017-04-07 DIAGNOSIS — K0889 Other specified disorders of teeth and supporting structures: Secondary | ICD-10-CM | POA: Diagnosis present

## 2017-04-07 DIAGNOSIS — R51 Headache: Secondary | ICD-10-CM | POA: Insufficient documentation

## 2017-04-07 DIAGNOSIS — Z79899 Other long term (current) drug therapy: Secondary | ICD-10-CM | POA: Insufficient documentation

## 2017-04-07 DIAGNOSIS — K047 Periapical abscess without sinus: Secondary | ICD-10-CM | POA: Diagnosis not present

## 2017-04-07 DIAGNOSIS — N189 Chronic kidney disease, unspecified: Secondary | ICD-10-CM | POA: Diagnosis not present

## 2017-04-07 DIAGNOSIS — K029 Dental caries, unspecified: Secondary | ICD-10-CM

## 2017-04-07 MED ORDER — KETOROLAC TROMETHAMINE 10 MG PO TABS: 10.0000 mg | ORAL_TABLET | Freq: Four times a day (QID) | ORAL | 0 refills | Status: DC | PRN

## 2017-04-07 MED ORDER — AMOXICILLIN-POT CLAVULANATE 875-125 MG PO TABS
1.0000 | ORAL_TABLET | Freq: Once | ORAL | Status: AC
  Administered 2017-04-07: 1 via ORAL
  Filled 2017-04-07: qty 1

## 2017-04-07 MED ORDER — AMOXICILLIN-POT CLAVULANATE 875-125 MG PO TABS: 1.0000 | ORAL_TABLET | Freq: Two times a day (BID) | ORAL | 0 refills | Status: AC

## 2017-04-07 MED ORDER — LIDOCAINE VISCOUS 2 % MT SOLN
15.0000 mL | Freq: Once | OROMUCOSAL | Status: AC
  Administered 2017-04-07: 15 mL via OROMUCOSAL
  Filled 2017-04-07: qty 15

## 2017-04-07 MED ORDER — KETOROLAC TROMETHAMINE 10 MG PO TABS
10.0000 mg | ORAL_TABLET | Freq: Once | ORAL | Status: AC
  Administered 2017-04-07: 10 mg via ORAL
  Filled 2017-04-07: qty 1

## 2017-04-07 NOTE — ED Notes (Signed)
Pt states that she has horrible headaches due too her jaw hurting on left side. The jaw pain went away yesterday, but the HA remained. Started 2-3 days ago. Family at bedside.

## 2017-04-07 NOTE — ED Triage Notes (Signed)
Pt presents to ED with headache and tooth pain to the upper and left and right side of her mouth for the past two days. Pt states she has not yet been to see her dentist. No obvious swelling noted. Pt denies drainage from the affected teeth. Hx of the same. Pt has been taking tylenol at home with very little relief.

## 2017-04-07 NOTE — ED Provider Notes (Signed)
[facility] Emergency Department Provider Note    First MD Initiated Contact with Patient 04/07/17 [phone]     (approximate)  I have reviewed the triage vital signs and the nursing notes.   HISTORY  Chief Complaint Dental Pain and Headache    HPI Judy Conner is a 31 y.o. female presents with bilateral mandibular molar caries with 10 out 10 pain. She denies any fever no difficulty swallowing.patient states that she has an appointment with dentist but is not for "weeks"   Past Medical History:  Diagnosis Date  . Anemia    hx low iron   . Chronic kidney disease    hx uti none currently  . GERD (gastroesophageal reflux disease)   . Headache(784.0)     There are no active problems to display for this patient.   Past Surgical History:  Procedure Laterality Date  . CESAREAN SECTION  [phone]  . FINGER FRACTURE SURGERY     left pinky    Prior to Admission medications   Medication Sig Start Date End Date Taking? Authorizing Provider  acetaminophen (TYLENOL) 500 MG tablet Take 500 mg by mouth every 6 (six) hours as needed (pain).    [provider]  cyclobenzaprine (FLEXERIL) 5 MG tablet Take 1 tablet (5 mg total) by mouth 3 (three) times daily as needed for muscle spasms. Patient not taking: Reported on 10/31/2016 08/21/16   Ashley Murrain, NP  famotidine (PEPCID) 20 MG tablet Take 1 tablet (20 mg total) by mouth 2 (two) times daily. 10/31/16   Carrie Mew, MD  ondansetron (ZOFRAN ODT) 4 MG disintegrating tablet Take 1 tablet (4 mg total) by mouth every 8 (eight) hours as needed for nausea or vomiting. 10/31/16   Carrie Mew, MD  ondansetron (ZOFRAN) 4 MG tablet Take 1 tablet (4 mg total) by mouth every 8 (eight) hours as needed for nausea or vomiting. 10/24/16   Clayton Bibles, PA-C    Allergies Fruit & vegetable daily [nutritional supplements] and Fentanyl  Family History  Problem Relation Age of Onset  . Breast cancer  Maternal Grandmother   . Diabetes Maternal Grandmother   . Clotting disorder Paternal Uncle     Social History Social History  Substance Use Topics  . Smoking status: Current Some Day Smoker    Types: Cigarettes  . Smokeless tobacco: Never Used  . Alcohol use No    Review of Systems Constitutional: No fever/chills Eyes: No visual changes. ENT: No sore throat.positive for dental pain Cardiovascular: Denies chest pain. Respiratory: Denies shortness of breath. Gastrointestinal: No abdominal pain.  No nausea, no vomiting.  No diarrhea.  No constipation. Genitourinary: Negative for dysuria. Musculoskeletal: Negative for neck pain.  Negative for back pain. Integumentary: Negative for rash. Neurological: Negative for headaches, focal weakness or numbness.   ____________________________________________   PHYSICAL EXAM:  VITAL SIGNS: ED Triage Vitals  Enc Vitals Group     BP 04/07/17 0019 113/73     Pulse Rate 04/07/17 0019 100     Resp 04/07/17 0019 20     Temp --      Temp Source 04/07/17 0019 Oral     SpO2 04/07/17 0019 99 %     Weight 04/07/17 0020 95.3 kg (210 lb)     Height 04/07/17 0020 1.626 m (5\' 4" )     Head Circumference --      Peak Flow --      Pain Score 04/07/17 0019 9     Pain Loc --  Pain Edu? --      Excl. in [place]? --     Constitutional: Alert and oriented. Well appearing and in no acute distress. Eyes: Conjunctivae are normal.  Head: Atraumatic. Mouth/Throat: Mucous membranes are moist.  Oropharynx non-erythematous.bilateral mandible molar dental caries F worse than right Neck: No stridor.  Cardiovascular: Normal rate, regular rhythm. Good peripheral circulation. Grossly normal heart sounds. Musculoskeletal: No lower extremity tenderness nor edema. No gross deformities of extremities. Neurologic:  Normal speech and language. No gross focal neurologic deficits are appreciated.  Skin:  Skin is warm, dry and intact. No rash  noted.    Procedures   ____________________________________________   INITIAL IMPRESSION / ASSESSMENT AND PLAN / ED COURSE  Pertinent labs & imaging results that were available during my care of the patient were reviewed by me and considered in my medical decision making (see chart for details).  31 yo female present with above-stated history of physical exam consistent pain secondary to multiple dental caries. Patient will be given Toradol viscous lidocaine and Augmentin emergency department will be prescribed same for home      ____________________________________________  FINAL CLINICAL IMPRESSION(S) / ED DIAGNOSES  Final diagnoses:  Dental caries     MEDICATIONS GIVEN DURING THIS VISIT:  Medications  lidocaine (XYLOCAINE) 2 % viscous mouth solution 15 mL (not administered)  amoxicillin-clavulanate (AUGMENTIN) 875-125 MG per tablet 1 tablet (not administered)  ketorolac (TORADOL) tablet 10 mg (not administered)     NEW OUTPATIENT MEDICATIONS STARTED DURING THIS VISIT:  New Prescriptions   No medications on file    Modified Medications   No medications on file    Discontinued Medications   No medications on file     Note:  This document was prepared using Dragon voice recognition software and may include unintentional dictation errors.    Gregor Hams, MD 04/07/17 [phone]

## 2017-08-24 ENCOUNTER — Encounter: Payer: Self-pay | Admitting: Emergency Medicine

## 2017-08-24 ENCOUNTER — Other Ambulatory Visit: Payer: Self-pay

## 2017-08-24 DIAGNOSIS — E876 Hypokalemia: Secondary | ICD-10-CM | POA: Diagnosis not present

## 2017-08-24 DIAGNOSIS — R1084 Generalized abdominal pain: Secondary | ICD-10-CM | POA: Diagnosis present

## 2017-08-24 DIAGNOSIS — R1030 Lower abdominal pain, unspecified: Secondary | ICD-10-CM | POA: Diagnosis not present

## 2017-08-24 DIAGNOSIS — D1803 Hemangioma of intra-abdominal structures: Secondary | ICD-10-CM | POA: Insufficient documentation

## 2017-08-24 DIAGNOSIS — R1011 Right upper quadrant pain: Secondary | ICD-10-CM | POA: Diagnosis not present

## 2017-08-24 DIAGNOSIS — D72829 Elevated white blood cell count, unspecified: Secondary | ICD-10-CM | POA: Insufficient documentation

## 2017-08-24 DIAGNOSIS — R112 Nausea with vomiting, unspecified: Secondary | ICD-10-CM | POA: Insufficient documentation

## 2017-08-24 DIAGNOSIS — R000 Tachycardia, unspecified: Secondary | ICD-10-CM | POA: Insufficient documentation

## 2017-08-24 DIAGNOSIS — F17210 Nicotine dependence, cigarettes, uncomplicated: Secondary | ICD-10-CM | POA: Insufficient documentation

## 2017-08-24 DIAGNOSIS — R197 Diarrhea, unspecified: Secondary | ICD-10-CM | POA: Insufficient documentation

## 2017-08-24 DIAGNOSIS — N189 Chronic kidney disease, unspecified: Secondary | ICD-10-CM | POA: Insufficient documentation

## 2017-08-24 LAB — CBC
HCT: 45.8 % (ref 35.0–47.0)
Hemoglobin: 15.3 g/dL (ref 12.0–16.0)
MCH: 31.7 pg (ref 26.0–34.0)
MCHC: 33.3 g/dL (ref 32.0–36.0)
MCV: 95.3 fL (ref 80.0–100.0)
Platelets: 235 10*3/uL (ref 150–440)
RBC: 4.81 MIL/uL (ref 3.80–5.20)
RDW: 13.6 % (ref 11.5–14.5)
WBC: 15.9 10*3/uL — ABNORMAL HIGH (ref 3.6–11.0)

## 2017-08-24 LAB — COMPREHENSIVE METABOLIC PANEL
ALT: 45 U/L (ref 14–54)
AST: 39 U/L (ref 15–41)
Albumin: 4.2 g/dL (ref 3.5–5.0)
Alkaline Phosphatase: 52 U/L (ref 38–126)
Anion gap: 11 (ref 5–15)
BUN: 15 mg/dL (ref 6–20)
CO2: 26 mmol/L (ref 22–32)
Calcium: 9.2 mg/dL (ref 8.9–10.3)
Chloride: 100 mmol/L — ABNORMAL LOW (ref 101–111)
Creatinine, Ser: 1.16 mg/dL — ABNORMAL HIGH (ref 0.44–1.00)
GFR calc Af Amer: >60 mL/min (ref >60)
GFR calc non Af Amer: >60 mL/min (ref >60)
Glucose, Bld: 100 mg/dL — ABNORMAL HIGH (ref 65–99)
Potassium: 2.9 mmol/L — ABNORMAL LOW (ref 3.5–5.1)
Sodium: 137 mmol/L (ref 135–145)
Total Bilirubin: 0.9 mg/dL (ref 0.3–1.2)
Total Protein: 7.8 g/dL (ref 6.5–8.1)

## 2017-08-24 LAB — URINALYSIS, COMPLETE (UACMP) WITH MICROSCOPIC
Bilirubin Urine: NEGATIVE
Glucose, UA: NEGATIVE mg/dL
Ketones, ur: NEGATIVE mg/dL
Nitrite: NEGATIVE
Protein, ur: NEGATIVE mg/dL
Specific Gravity, Urine: 1.02 (ref 1.005–1.030)
pH: 6 (ref 5.0–8.0)

## 2017-08-24 LAB — LIPASE, BLOOD: Lipase: 38 U/L (ref 11–51)

## 2017-08-24 LAB — POCT PREGNANCY, URINE: Preg Test, Ur: NEGATIVE

## 2017-08-24 NOTE — ED Triage Notes (Signed)
Patient ambulatory to triage with steady gait, without difficulty or distress noted; pt reports since Saturday having N/V with right lower abd pain

## 2017-08-25 ENCOUNTER — Emergency Department: Payer: BLUE CROSS/BLUE SHIELD

## 2017-08-25 ENCOUNTER — Emergency Department
Admission: EM | Admit: 2017-08-25 | Discharge: 2017-08-25 | Disposition: A | Discharge status: Home / Self Care | Payer: BLUE CROSS/BLUE SHIELD | Attending: Emergency Medicine | Admitting: Emergency Medicine

## 2017-08-25 DIAGNOSIS — R197 Diarrhea, unspecified: Secondary | ICD-10-CM

## 2017-08-25 DIAGNOSIS — E876 Hypokalemia: Secondary | ICD-10-CM

## 2017-08-25 DIAGNOSIS — R1084 Generalized abdominal pain: Secondary | ICD-10-CM

## 2017-08-25 DIAGNOSIS — R112 Nausea with vomiting, unspecified: Secondary | ICD-10-CM

## 2017-08-25 DIAGNOSIS — D1803 Hemangioma of intra-abdominal structures: Secondary | ICD-10-CM

## 2017-08-25 LAB — MAGNESIUM: Magnesium: 2.2 mg/dL (ref 1.7–2.4)

## 2017-08-25 MED ORDER — POTASSIUM CHLORIDE CRYS ER 20 MEQ PO TBCR: 20.0000 meq | EXTENDED_RELEASE_TABLET | Freq: Every day | ORAL | 0 refills | Status: DC

## 2017-08-25 MED ORDER — SODIUM CHLORIDE 0.9 % IV BOLUS (SEPSIS)
1000.0000 mL | INTRAVENOUS | Status: AC
  Administered 2017-08-25: 1000 mL via INTRAVENOUS

## 2017-08-25 MED ORDER — POTASSIUM CHLORIDE CRYS ER 20 MEQ PO TBCR
40.0000 meq | EXTENDED_RELEASE_TABLET | Freq: Once | ORAL | Status: AC
Start: 2017-08-25 — End: 2017-08-25
  Administered 2017-08-25: 40 meq via ORAL
  Filled 2017-08-25: qty 2

## 2017-08-25 MED ORDER — POTASSIUM CHLORIDE 10 MEQ/100ML IV SOLN
10.0000 meq | Freq: Once | INTRAVENOUS | Status: AC
  Administered 2017-08-25: 10 meq via INTRAVENOUS
  Filled 2017-08-25: qty 100

## 2017-08-25 MED ORDER — ONDANSETRON 4 MG PO TBDP: ORAL_TABLET | ORAL | 0 refills | Status: DC

## 2017-08-25 NOTE — Discharge Instructions (Addendum)
We believe your symptoms are caused by either a viral infection or possible a bad food exposure.  Either way, since your symptoms have improved, we feel it is safe for you to go home and follow up with your regular doctor.  Please read the included information and stick to a bland diet for the next two days.  Drink plenty of clear fluids, and if you were provided with a prescription, please take it according to the label instructions.    Your potassium was low today and we encourage you to take a potassium supplement as prescribed.  Try to drink plenty of fluids such as Gatorade that will help to replenish her electrolytes.  Look through the included information about hypokalemia and the potassium content of foods.  If you develop any new or worsening symptoms, including persistent vomiting not controlled with medication, fever greater than 101, severe or worsening abdominal pain, or other symptoms that concern you, please return immediately to the Emergency Department.

## 2017-08-25 NOTE — ED Provider Notes (Signed)
[facility] Emergency Department Provider Note  ____________________________________________   First MD Initiated Contact with Patient 08/25/17 0037     (approximate)  I have reviewed the triage vital signs and the nursing notes.   HISTORY  Chief Complaint Abdominal Pain    HPI Judy Conner is a 32 y.o. female who presents for evaluation of persistent and severe GI symptoms for the last 4 days.  She states that they were rather acute in onset 4 days ago after she and her family ate at a [employer], and within a few hours both of her kids were ill with vomiting and some mild abdominal pain and that she got sick by the next day.  She states that her kids have gotten better but her symptoms have persisted "because I have bad GERD and I cannot bounce back as easily".  He states she has not been able to eat anything for at least 2 days because of the abdominal pain and the nausea although she has not vomited for 1-2 days.  She reports one large volume of loose stool earlier today but no consistent or persistent diarrhea.  She denies fever/chills, chest pain, shortness of breath, URI symptoms area.  She describes abdominal pain as being in her upper abdomen as a burning and sharp pain, but also in her lower abdomen as an aching and sharp pain.  She says that she knows her body and she believes something is wrong with her intestines.  She has had 3 C-sections but has never had a cholecystectomy nor appendectomy.  Past Medical History:  Diagnosis Date  . Anemia    hx low iron   . Chronic kidney disease    hx uti none currently  . GERD (gastroesophageal reflux disease)   . Headache(784.0)     There are no active problems to display for this patient.   Past Surgical History:  Procedure Laterality Date  . CESAREAN SECTION  [phone]  . FINGER FRACTURE SURGERY     left pinky    Prior to Admission medications   Medication Sig Start Date End  Date Taking? Authorizing Provider  acetaminophen (TYLENOL) 500 MG tablet Take 500 mg by mouth every 6 (six) hours as needed (pain).    [provider]  cyclobenzaprine (FLEXERIL) 5 MG tablet Take 1 tablet (5 mg total) by mouth 3 (three) times daily as needed for muscle spasms. Patient not taking: Reported on 10/31/2016 08/21/16   Ashley Murrain, NP  famotidine (PEPCID) 20 MG tablet Take 1 tablet (20 mg total) by mouth 2 (two) times daily. 10/31/16   Carrie Mew, MD  ketorolac (TORADOL) 10 MG tablet Take 1 tablet (10 mg total) by mouth every 6 (six) hours as needed. 04/07/17   Gregor Hams, MD  ondansetron (ZOFRAN ODT) 4 MG disintegrating tablet Allow 1-2 tablets to dissolve in your mouth every 8 hours as needed for nausea/vomiting 08/25/17   Hinda Kehr, MD  ondansetron (ZOFRAN) 4 MG tablet Take 1 tablet (4 mg total) by mouth every 8 (eight) hours as needed for nausea or vomiting. 10/24/16   Clayton Bibles, PA-C  potassium chloride SA (KLOR-CON M20) 20 MEQ tablet Take 1 tablet (20 mEq total) by mouth daily. 08/25/17   Hinda Kehr, MD    Allergies Fruit & vegetable daily [nutritional supplements] and Fentanyl  Family History  Problem Relation Age of Onset  . Breast cancer Maternal Grandmother   . Diabetes Maternal Grandmother   . Clotting disorder Paternal  Uncle     Social History Social History   Tobacco Use  . Smoking status: Current Some Day Smoker    Types: Cigarettes  . Smokeless tobacco: Never Used  Substance Use Topics  . Alcohol use: No  . Drug use: No    Review of Systems Constitutional: No fever/chills Eyes: No visual changes. ENT: No sore throat. Cardiovascular: Denies chest pain. Respiratory: Denies shortness of breath. Gastrointestinal: Abdominal pain, nausea, and vomiting as described above.  One loose stool earlier today. Genitourinary: Negative for dysuria. Musculoskeletal: Negative for neck pain.  Negative for back pain. Integumentary: Negative for  rash. Neurological: Negative for headaches, focal weakness or numbness.   ____________________________________________   PHYSICAL EXAM:  VITAL SIGNS: ED Triage Vitals  Enc Vitals Group     BP 08/24/17 2116 122/80     Pulse Rate 08/24/17 2116 (!) 101     Resp 08/24/17 2116 18     Temp 08/24/17 2116 97.8 F (36.6 C)     Temp Source 08/24/17 2116 Oral     SpO2 08/24/17 2116 100 %     Weight 08/24/17 2114 99.8 kg (220 lb)     Height 08/24/17 2114 1.676 m (5\' 6" )     Head Circumference --      Peak Flow --      Pain Score 08/24/17 2114 10     Pain Loc --      Pain Edu? --      Excl. in [place]? --     Constitutional: Alert and oriented. Well appearing and in no acute distress. Eyes: Conjunctivae are normal.  Head: Atraumatic. Nose: No congestion/rhinnorhea. Mouth/Throat: Mucous membranes are moist. Neck: No stridor.  No meningeal signs.   Cardiovascular: Normal rate, regular rhythm. Good peripheral circulation. Grossly normal heart sounds. Respiratory: Normal respiratory effort.  No retractions. Lungs CTAB. Gastrointestinal: Soft and nondistended.  Tenderness to palpation of the right upper quadrant with only positive Murphy sign.  Diffuse tenderness to palpation in lower abdomen with no rebound and no guarding. Musculoskeletal: No lower extremity tenderness nor edema. No gross deformities of extremities. Neurologic:  Normal speech and language. No gross focal neurologic deficits are appreciated.  Skin:  Skin is warm, dry and intact. No rash noted. Psychiatric: Mood and affect are normal. Speech and behavior are normal.  ____________________________________________   LABS (all labs ordered are listed, but only abnormal results are displayed)  Labs Reviewed  COMPREHENSIVE METABOLIC PANEL - Abnormal; Notable for the following components:      Result Value   Potassium 2.9 (*)    Chloride 100 (*)    Glucose, Bld 100 (*)    Creatinine, Ser 1.16 (*)    All other components  within normal limits  CBC - Abnormal; Notable for the following components:   WBC 15.9 (*)    All other components within normal limits  URINALYSIS, COMPLETE (UACMP) WITH MICROSCOPIC - Abnormal; Notable for the following components:   Color, Urine AMBER (*)    APPearance HAZY (*)    Hgb urine dipstick SMALL (*)    Leukocytes, UA SMALL (*)    Bacteria, UA RARE (*)    Squamous Epithelial / LPF 0-5 (*)    All other components within normal limits  LIPASE, BLOOD  MAGNESIUM  POC URINE PREG, ED  POCT PREGNANCY, URINE   ____________________________________________  EKG  ED ECG REPORT I, Hinda Kehr, the attending physician, personally viewed and interpreted this ECG.  Date: 08/25/2017 EKG Time: 2:34 Rate: 58  Rhythm: normal sinus rhythm QRS Axis: RAD Intervals: normal ST/T Wave abnormalities: normal Narrative Interpretation: no evidence of acute ischemia  ____________________________________________  RADIOLOGY   ED MD interpretation: Hemangioma of the liver, some gallbladder sludge but no definitive acute disease is identified by radiology  Official radiology report(s): US Abdomen Limited Ruq  Result Date: 08/25/2017 CLINICAL DATA:  Acute onset of right upper quadrant abdominal pain. Nausea and vomiting. EXAM: ULTRASOUND ABDOMEN LIMITED RIGHT UPPER QUADRANT COMPARISON:  CT of the abdomen and pelvis from 10/24/2016 FINDINGS: Gallbladder: No gallstones or wall thickening visualized. Mild echogenic sludge is suggested within the gallbladder. No sonographic Murphy sign noted by sonographer. Common bile duct: Diameter: 0.5 cm, within normal limits in caliber. Liver: A 2.0 x 1.9 x 1.5 cm echogenic lesion at the right hepatic lobe appears to reflect an hemangioma. Within normal limits in parenchymal echogenicity. Portal vein is patent on color Doppler imaging with normal direction of blood flow towards the liver. IMPRESSION: 1. No acute abnormality seen within the right upper quadrant. 2.  Sludge noted within the gallbladder. Gallbladder otherwise unremarkable. 3. 2.0 cm hepatic hemangioma noted. Electronically Signed   By: Garald Balding M.D.   On: 08/25/2017 02:33    ____________________________________________   PROCEDURES  Critical Care performed: No   Procedure(s) performed:   Procedures   ____________________________________________   INITIAL IMPRESSION / ASSESSMENT AND PLAN / ED COURSE  As part of my medical decision making, I reviewed the following data within the [facility] notes reviewed and incorporated, Labs reviewed  and Notes from prior ED visits    Differential diagnosis includes, but is not limited to, biliary disease (biliary colic, acute cholecystitis, cholangitis, choledocholithiasis, etc), intrathoracic causes for epigastric abdominal pain including ACS, gastritis, duodenitis, pancreatitis, small bowel or large bowel obstruction, abdominal aortic aneurysm, hernia, and gastritis.  She is also having lower abdominal discomfort which adds colitis, appendicitis, etc, to the list.  She had mild tachycardia upon arrival although that has improved after she is resting.  Her labs are notable for a potassium of 2.9 and a slightly elevated creatinine both of which are consistent with volume depletion due to decreased oral intake and GI losses.  I have added on a magnesium level.  Her white blood cell count is about 16 which could be consistent with intra-abdominal infection, but this could be viral gastroenteritis as well as an intra-abdominal bacterial infection such as appendicitis or cholecystitis.  Her area of maximal tenderness to palpation was the right upper  Quadrant and the majority of her symptoms sound consistent with biliary colic, so I will start with an ultrasound of her right upper quadrant.  If positive, I feel this would explain the majority of her symptoms.  However, I will have a low threshold for ordering a CT scan of  her abdomen and pelvis to rule out other acute intra-abdominal infection since she is also complaining of lower abdominal pain, has a leukocytosis, and mild tachycardia.  She is in no acute distress at this time.    ____________________________________________  FINAL CLINICAL IMPRESSION(S) / ED DIAGNOSES  Final diagnoses:  Hypokalemia, gastrointestinal losses  Generalized abdominal pain  Nausea vomiting and diarrhea  Hepatic hemangioma     MEDICATIONS GIVEN DURING THIS VISIT:  Medications  potassium chloride 10 mEq in 100 mL IVPB (10 mEq Intravenous New Bag/Given 08/25/17 0229)  potassium chloride SA (K-DUR,KLOR-CON) CR tablet 40 mEq (not administered)  sodium chloride 0.9 % bolus 1,000 mL (1,000 mLs Intravenous New  Bag/Given 08/25/17 0229)     ED Discharge Orders        Ordered    potassium chloride SA (KLOR-CON M20) 20 MEQ tablet  Daily     08/25/17 0321    ondansetron (ZOFRAN ODT) 4 MG disintegrating tablet     08/25/17 0321       Note:  This document was prepared using Dragon voice recognition software and may include unintentional dictation errors.    Hinda Kehr, MD 08/25/17 [phone]

## 2017-12-03 DIAGNOSIS — E876 Hypokalemia: Secondary | ICD-10-CM | POA: Diagnosis not present

## 2017-12-03 DIAGNOSIS — N189 Chronic kidney disease, unspecified: Secondary | ICD-10-CM | POA: Insufficient documentation

## 2017-12-03 DIAGNOSIS — R1010 Upper abdominal pain, unspecified: Secondary | ICD-10-CM | POA: Insufficient documentation

## 2017-12-03 DIAGNOSIS — Z79899 Other long term (current) drug therapy: Secondary | ICD-10-CM | POA: Diagnosis not present

## 2017-12-03 DIAGNOSIS — K2970 Gastritis, unspecified, without bleeding: Secondary | ICD-10-CM | POA: Insufficient documentation

## 2017-12-03 DIAGNOSIS — F17210 Nicotine dependence, cigarettes, uncomplicated: Secondary | ICD-10-CM | POA: Insufficient documentation

## 2017-12-03 DIAGNOSIS — R112 Nausea with vomiting, unspecified: Secondary | ICD-10-CM | POA: Diagnosis present

## 2017-12-04 ENCOUNTER — Other Ambulatory Visit: Payer: Self-pay

## 2017-12-04 ENCOUNTER — Emergency Department
Admission: EM | Admit: 2017-12-04 | Discharge: 2017-12-04 | Disposition: A | Discharge status: Home / Self Care | Payer: BLUE CROSS/BLUE SHIELD | Attending: Emergency Medicine | Admitting: Emergency Medicine

## 2017-12-04 DIAGNOSIS — K2970 Gastritis, unspecified, without bleeding: Secondary | ICD-10-CM

## 2017-12-04 DIAGNOSIS — E876 Hypokalemia: Secondary | ICD-10-CM

## 2017-12-04 DIAGNOSIS — R1010 Upper abdominal pain, unspecified: Secondary | ICD-10-CM

## 2017-12-04 DIAGNOSIS — R112 Nausea with vomiting, unspecified: Secondary | ICD-10-CM

## 2017-12-04 LAB — COMPREHENSIVE METABOLIC PANEL
ALT: 23 U/L (ref 14–54)
AST: 23 U/L (ref 15–41)
Albumin: 4.1 g/dL (ref 3.5–5.0)
Alkaline Phosphatase: 51 U/L (ref 38–126)
Anion gap: 9 (ref 5–15)
BUN: 11 mg/dL (ref 6–20)
CO2: 26 mmol/L (ref 22–32)
Calcium: 8.8 mg/dL — ABNORMAL LOW (ref 8.9–10.3)
Chloride: 103 mmol/L (ref 101–111)
Creatinine, Ser: 0.87 mg/dL (ref 0.44–1.00)
GFR calc Af Amer: >60 mL/min (ref >60)
GFR calc non Af Amer: >60 mL/min (ref >60)
Glucose, Bld: 103 mg/dL — ABNORMAL HIGH (ref 65–99)
Potassium: 3 mmol/L — ABNORMAL LOW (ref 3.5–5.1)
Sodium: 138 mmol/L (ref 135–145)
Total Bilirubin: 0.7 mg/dL (ref 0.3–1.2)
Total Protein: 7.6 g/dL (ref 6.5–8.1)

## 2017-12-04 LAB — URINALYSIS, COMPLETE (UACMP) WITH MICROSCOPIC
Bacteria, UA: NONE SEEN
Bilirubin Urine: NEGATIVE
Glucose, UA: NEGATIVE mg/dL
Ketones, ur: NEGATIVE mg/dL
Leukocytes, UA: NEGATIVE
Nitrite: NEGATIVE
Protein, ur: 100 mg/dL — AB
RBC / HPF: >50 RBC/hpf — ABNORMAL HIGH (ref 0–5)
Specific Gravity, Urine: 1.03 (ref 1.005–1.030)
pH: 6 (ref 5.0–8.0)

## 2017-12-04 LAB — CBC
HCT: 44.2 % (ref 35.0–47.0)
Hemoglobin: 14.9 g/dL (ref 12.0–16.0)
MCH: 32.4 pg (ref 26.0–34.0)
MCHC: 33.8 g/dL (ref 32.0–36.0)
MCV: 95.8 fL (ref 80.0–100.0)
Platelets: 254 10*3/uL (ref 150–440)
RBC: 4.61 MIL/uL (ref 3.80–5.20)
RDW: 14 % (ref 11.5–14.5)
WBC: 15.5 10*3/uL — ABNORMAL HIGH (ref 3.6–11.0)

## 2017-12-04 LAB — LIPASE, BLOOD: Lipase: 32 U/L (ref 11–51)

## 2017-12-04 LAB — POCT PREGNANCY, URINE: Preg Test, Ur: NEGATIVE

## 2017-12-04 MED ORDER — GI COCKTAIL ~~LOC~~
30.0000 mL | Freq: Once | ORAL | Status: AC
  Administered 2017-12-04: 30 mL via ORAL
  Filled 2017-12-04: qty 30

## 2017-12-04 MED ORDER — POTASSIUM CHLORIDE ER 10 MEQ PO TBCR: 40.0000 meq | EXTENDED_RELEASE_TABLET | Freq: Two times a day (BID) | ORAL | 0 refills | Status: DC

## 2017-12-04 MED ORDER — ONDANSETRON HCL 4 MG PO TABS
4.0000 mg | ORAL_TABLET | Freq: Once | ORAL | Status: AC
  Administered 2017-12-04: 4 mg via ORAL
  Filled 2017-12-04: qty 1

## 2017-12-04 MED ORDER — ONDANSETRON HCL 4 MG PO TABS: 4.0000 mg | ORAL_TABLET | Freq: Every day | ORAL | 0 refills | Status: DC | PRN

## 2017-12-04 NOTE — ED Notes (Addendum)
Pt says "it's acid reflux disease"; takes pepcid daily for same; pt reports pain to the center of her abdomen since Thursday; pt says pain is not any better, but she couldn't come before today as she didn't have a ride; pt reports 20 episodes of vomiting on Thursday, none on Friday, and then 10 episodes of vomiting on Saturday; intermittent diarrhea; pt says she's had many similar episodes and this episode is the same as previous; when asked pt to describe her pain she said "like I'm dying"; when asked to describe pain such as sharp, stabbing, aching squeezing, cramping, pt said "all of it. All of it)"; lights turned off when leaving room per pt request; abd soft

## 2017-12-04 NOTE — ED Provider Notes (Addendum)
[facility] Emergency Department Provider Note  ___________________________________________   First MD Initiated Contact with Patient 12/04/17 0701     (approximate)  I have reviewed the triage vital signs and the nursing notes.   HISTORY  Chief Complaint Emesis  HPI Judy Conner is a 32 y.o. female with a history of GERD as well as IBS who is presenting to the emergency department today with multiple episodes of vomiting over the past 4 days.  She says that she also had one episode of diarrhea this past Thursday.  However, has not moved her bowels since.  However, she says that her bowel movements are regular secondary to her IBS.  She says that she ate spaghetti this past Tuesday night.  She said that she ate with red sauce which is 1 of her trigger foods.  She says since then she had multiple episodes of vomiting throughout the day and is only able to keep down Gatorade.  She says that she has been taking her Pepcid without relief.  Says that she has been on Protonix and omeprazole in the past that have not helped with her symptoms.  Says that she has a "10 out of 10 "upper abdominal pain that she is not able to qualify.  Says the symptoms are similar to previous symptoms with her GERD.   Past Medical History:  Diagnosis Date  . Anemia    hx low iron   . Chronic kidney disease    hx uti none currently  . GERD (gastroesophageal reflux disease)   . Headache(784.0)     There are no active problems to display for this patient.   Past Surgical History:  Procedure Laterality Date  . CESAREAN SECTION  [phone]  . FINGER FRACTURE SURGERY     left pinky    Prior to Admission medications   Medication Sig Start Date End Date Taking? Authorizing Provider  acetaminophen (TYLENOL) 500 MG tablet Take 500 mg by mouth every 6 (six) hours as needed (pain).    [provider]  cyclobenzaprine (FLEXERIL) 5 MG tablet Take 1 tablet (5 mg total) by  mouth 3 (three) times daily as needed for muscle spasms. Patient not taking: Reported on 10/31/2016 08/21/16   Ashley Murrain, NP  famotidine (PEPCID) 20 MG tablet Take 1 tablet (20 mg total) by mouth 2 (two) times daily. 10/31/16   Carrie Mew, MD  ketorolac (TORADOL) 10 MG tablet Take 1 tablet (10 mg total) by mouth every 6 (six) hours as needed. 04/07/17   Gregor Hams, MD  ondansetron (ZOFRAN ODT) 4 MG disintegrating tablet Allow 1-2 tablets to dissolve in your mouth every 8 hours as needed for nausea/vomiting 08/25/17   Hinda Kehr, MD  ondansetron (ZOFRAN) 4 MG tablet Take 1 tablet (4 mg total) by mouth every 8 (eight) hours as needed for nausea or vomiting. 10/24/16   Clayton Bibles, PA-C  potassium chloride SA (KLOR-CON M20) 20 MEQ tablet Take 1 tablet (20 mEq total) by mouth daily. 08/25/17   Hinda Kehr, MD    Allergies Fruit & vegetable daily [nutritional supplements] and Fentanyl  Family History  Problem Relation Age of Onset  . Breast cancer Maternal Grandmother   . Diabetes Maternal Grandmother   . Clotting disorder Paternal Uncle     Social History Social History   Tobacco Use  . Smoking status: Current Some Day Smoker    Types: Cigarettes  . Smokeless tobacco: Never Used  Substance Use Topics  .  Alcohol use: No  . Drug use: No    Review of Systems  Constitutional: No fever/chills Eyes: No visual changes. ENT: No sore throat. Cardiovascular: Denies chest pain. Respiratory: Denies shortness of breath. Gastrointestinal:  No constipation. Genitourinary: Negative for dysuria. Musculoskeletal: Negative for back pain. Skin: Negative for rash. Neurological: Negative for headaches, focal weakness or numbness.   ____________________________________________   PHYSICAL EXAM:  VITAL SIGNS: ED Triage Vitals  Enc Vitals Group     BP 12/04/17 0028 (!) 142/93     Pulse Rate 12/04/17 0028 (!) 51     Resp 12/04/17 0028 20     Temp 12/04/17 0028 98.6 F (37 C)      Temp Source 12/04/17 0028 Oral     SpO2 12/04/17 0028 100 %     Weight 12/04/17 0029 216 lb (98 kg)     Height 12/04/17 0029 5\' 5"  (1.651 m)     Head Circumference --      Peak Flow --      Pain Score 12/04/17 0029 10     Pain Loc --      Pain Edu? --      Excl. in [place]? --     Constitutional: Alert and oriented. Well appearing and in no acute distress. Eyes: Conjunctivae are normal.  Head: Atraumatic. Nose: No congestion/rhinnorhea. Mouth/Throat: Mucous membranes are moist.  Neck: No stridor.   Cardiovascular: Normal rate, regular rhythm. Grossly normal heart sounds.  Good peripheral circulation. Respiratory: Normal respiratory effort.  No retractions. Lungs CTAB. Gastrointestinal: Soft with moderate epigastric tenderness to palpation without any rebound or guarding.  Negative Murphy sign.  No distention. No CVA tenderness. Musculoskeletal: No lower extremity tenderness nor edema.  No joint effusions. Neurologic:  Normal speech and language. No gross focal neurologic deficits are appreciated. Skin:  Skin is warm, dry and intact. No rash noted. Psychiatric: Mood and affect are normal. Speech and behavior are normal.  ____________________________________________   LABS (all labs ordered are listed, but only abnormal results are displayed)  Labs Reviewed  COMPREHENSIVE METABOLIC PANEL - Abnormal; Notable for the following components:      Result Value   Potassium 3.0 (*)    Glucose, Bld 103 (*)    Calcium 8.8 (*)    All other components within normal limits  CBC - Abnormal; Notable for the following components:   WBC 15.5 (*)    All other components within normal limits  URINALYSIS, COMPLETE (UACMP) WITH MICROSCOPIC - Abnormal; Notable for the following components:   Color, Urine AMBER (*)    APPearance HAZY (*)    Hgb urine dipstick SMALL (*)    Protein, ur 100 (*)    RBC / HPF >50 (*)    All other components within normal limits  LIPASE, BLOOD  POC URINE PREG, ED    POCT PREGNANCY, URINE   ____________________________________________  EKG   ____________________________________________  RADIOLOGY   ____________________________________________   PROCEDURES  Procedure(s) performed:   Procedures  Critical Care performed:   ____________________________________________   INITIAL IMPRESSION / ASSESSMENT AND PLAN / ED COURSE  Pertinent labs & imaging results that were available during my care of the patient were reviewed by me and considered in my medical decision making (see chart for details).  Differential diagnosis includes, but is not limited to, biliary disease (biliary colic, acute cholecystitis, cholangitis, choledocholithiasis, etc), intrathoracic causes for epigastric abdominal pain including ACS, gastritis, duodenitis, pancreatitis, small bowel or large bowel obstruction, abdominal aortic aneurysm, hernia, and  ulcer(s). As part of my medical decision making, I reviewed the following data within the electronic MEDICAL RECORD NUMBER Notes from prior ED visits  ----------------------------------------- 8:47 AM on 12/04/2017 -----------------------------------------  Patient at this time feels much improved.  Tolerating p.o. fluids at this time.  Will be discharged with Zofran as well as potassium.  I will also give her the follow-up number for gastroneurology.  She will continue to follow-up with the health department where she has been seen previously for outpatient care.  She is understanding of the diagnosis as well as treatment plan and willing to comply.  ____________________________________________   FINAL CLINICAL IMPRESSION(S) / ED DIAGNOSES  Upper abdominal pain.  Nausea vomiting.  Gastritis.  Hypokalemia.    NEW MEDICATIONS STARTED DURING THIS VISIT:  New Prescriptions   No medications on file     Note:  This document was prepared using Dragon voice recognition software and may include unintentional dictation  errors.     Cheo Selvey, Randall An, MD 12/04/17 [phone]  Patient says that she has a small amount of vaginal bleeding and this is likely why the urine has red and white blood cells in it.  Says that she does have associated vaginal bleeding when she has these episodes.    Orbie Pyo, MD 12/04/17 [phone]

## 2017-12-04 NOTE — ED Notes (Signed)
Report to Amy, RN

## 2017-12-04 NOTE — ED Triage Notes (Signed)
Patient reports vomiting since Thursday.  States "I just don't feel right."

## 2017-12-04 NOTE — ED Notes (Signed)
Pt has not provided a urine specimen since arrival; does not need to void at this time; understands a UA has been provided and will provide one when available

## 2018-04-08 ENCOUNTER — Emergency Department
Admission: EM | Admit: 2018-04-08 | Discharge: 2018-04-08 | Disposition: A | Discharge status: Home / Self Care | Payer: BLUE CROSS/BLUE SHIELD | Attending: Emergency Medicine | Admitting: Emergency Medicine

## 2018-04-08 ENCOUNTER — Encounter: Payer: Self-pay | Admitting: Emergency Medicine

## 2018-04-08 ENCOUNTER — Other Ambulatory Visit: Payer: Self-pay

## 2018-04-08 DIAGNOSIS — R51 Headache: Secondary | ICD-10-CM | POA: Diagnosis present

## 2018-04-08 DIAGNOSIS — Z5321 Procedure and treatment not carried out due to patient leaving prior to being seen by health care provider: Secondary | ICD-10-CM | POA: Diagnosis not present

## 2018-04-08 NOTE — ED Triage Notes (Signed)
Here for headache that started this morning. No history of headaches, typically does not get them.  Nausea without vomiting. No photophobia, vomiting, or vision changes.  No injuries. Steady gait to triage. Speech clear. Present upon waking.

## 2018-04-08 NOTE — ED Notes (Signed)
Called for room, not in waiting room.  

## 2018-04-08 NOTE — ED Notes (Signed)
Called for room, not in wr.

## 2018-04-08 NOTE — ED Notes (Signed)
Called for room, not in waiting room.

## 2018-05-02 IMAGING — CT CT ABD-PELV W/ CM
2 of 4 series · 15 of 46 positions shown, 17 images · IV contrast (ISOVUE)
Comparison: Chest CT 06/04/2011

CLINICAL DATA: Left lower quadrant abdominal pain since [REDACTED].
Vomiting.

EXAM:
CT ABDOMEN AND PELVIS WITH CONTRAST
TECHNIQUE: Multidetector CT imaging of the abdomen and pelvis was performed
using the standard protocol following bolus administration of
intravenous contrast.
CONTRAST:  100mL H3J2E1-2GG IOPAMIDOL (H3J2E1-2GG) INJECTION 61%

[Series 2: abd/pel with · axial · 0.82mm/px · z∈[-458,-48]mm · 12 of 92 slices shown, 14 images]
[im 5/92  soft-tissue]
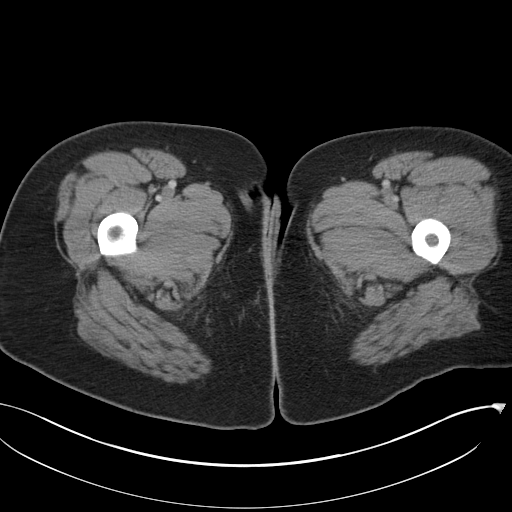
[im 5/92  bone]
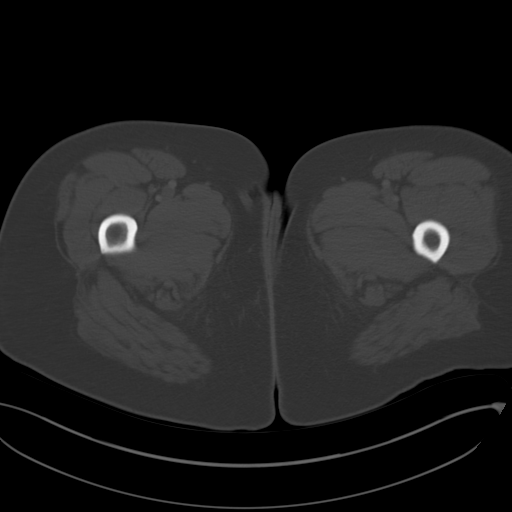
[im 15/92  soft-tissue]
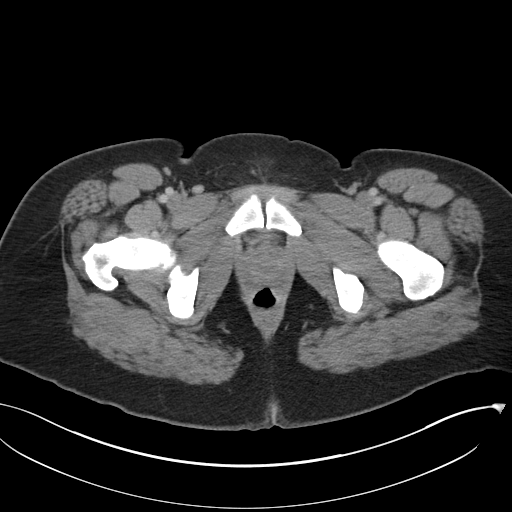
[im 20/92  soft-tissue]
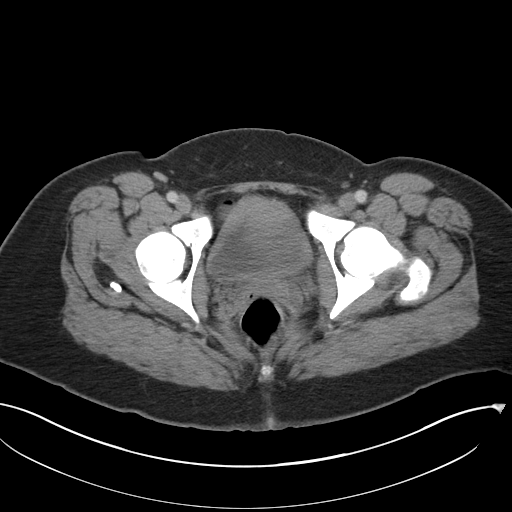
[im 29/92  soft-tissue]
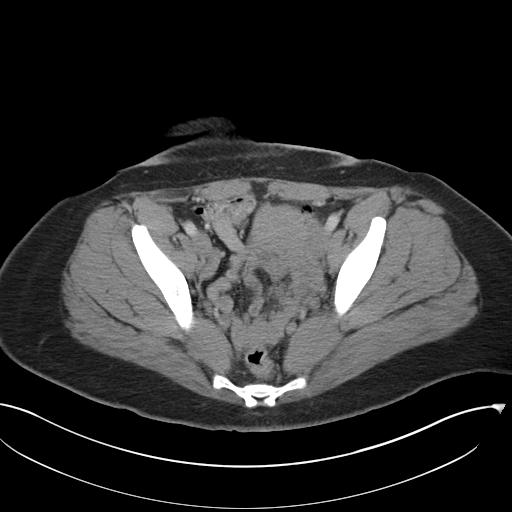
[im 34/92  soft-tissue]
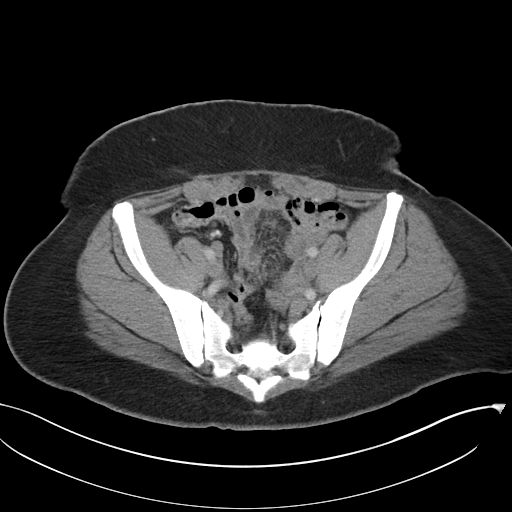
[im 44/92  soft-tissue]
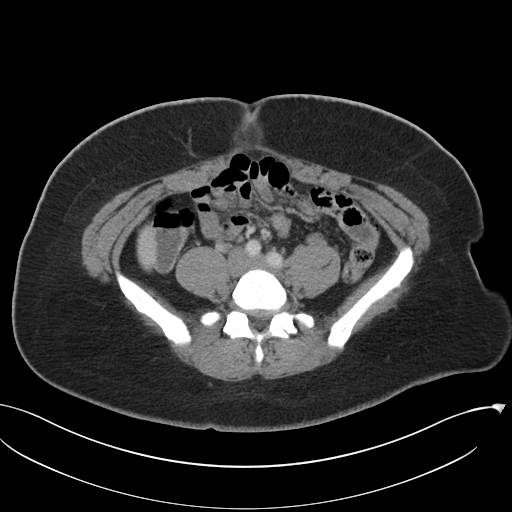
[im 48/92  soft-tissue]
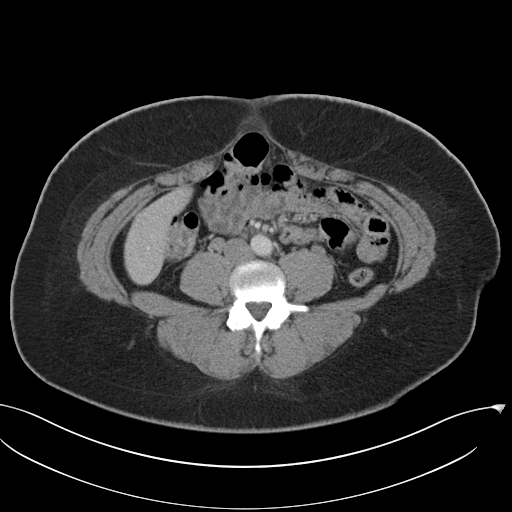
[im 58/92  soft-tissue]
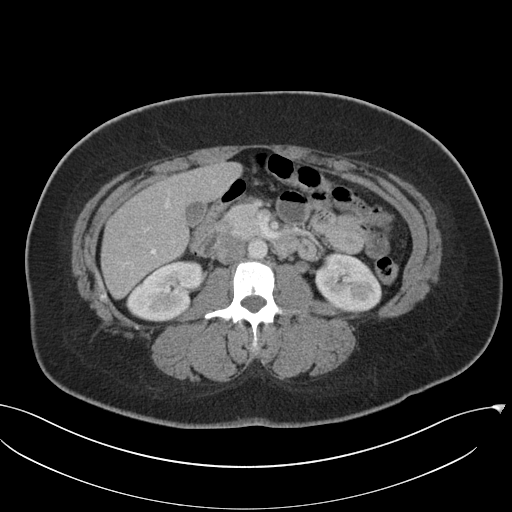
[im 63/92  soft-tissue]
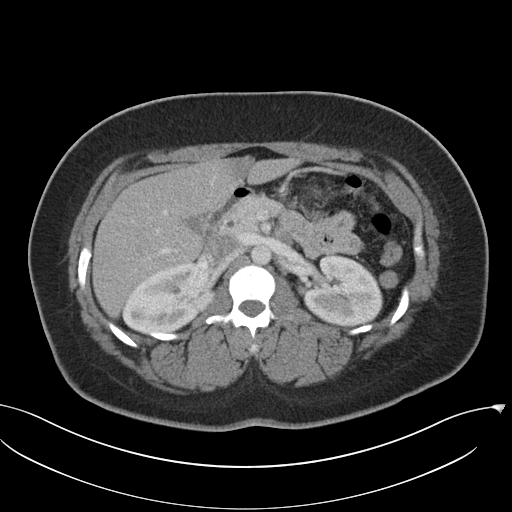
[im 63/92  bone]
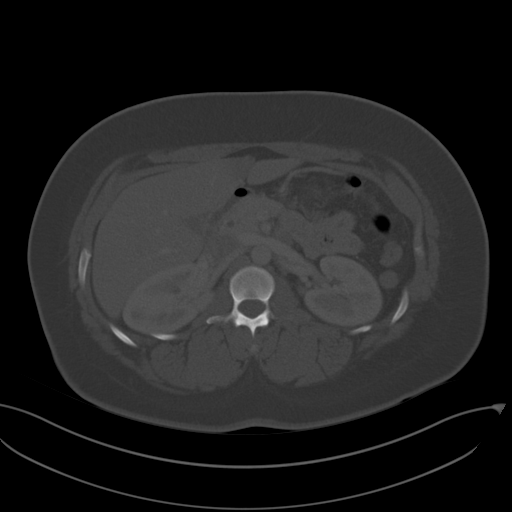
[im 72/92  soft-tissue]
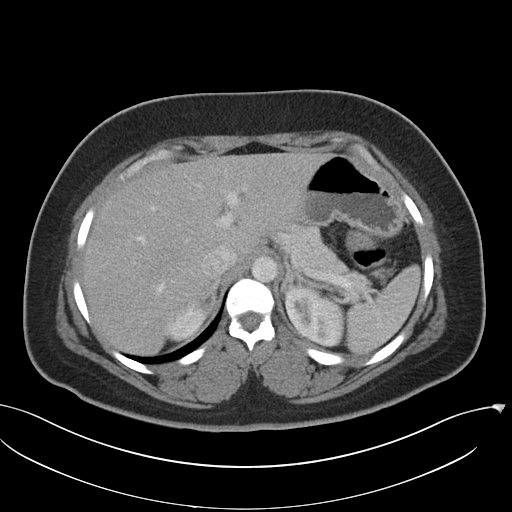
[im 77/92  soft-tissue]
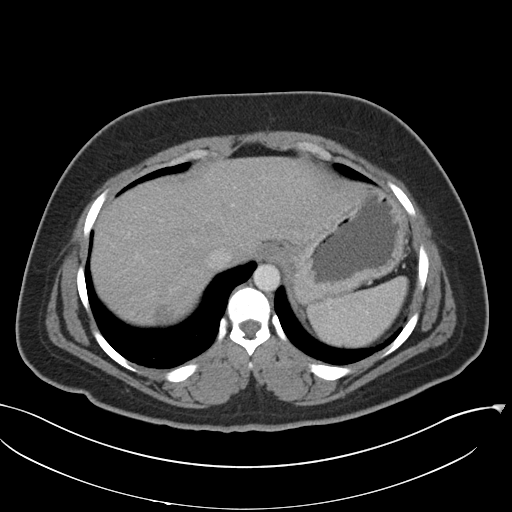
[im 87/92  soft-tissue]
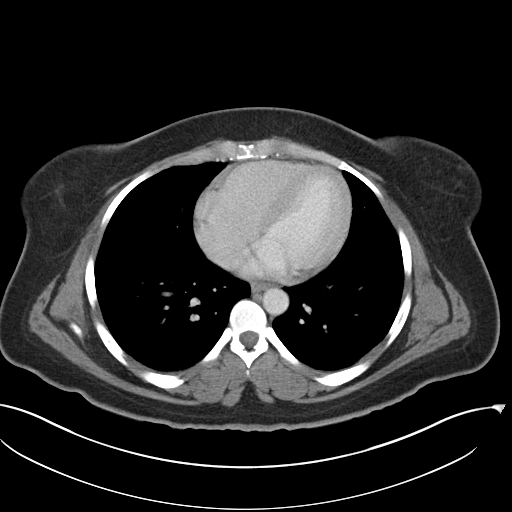

[Series 3: coronal a/|p · coronal · 0.74mm/px · 3 of 187 slices shown]
[im 63/187  soft-tissue]
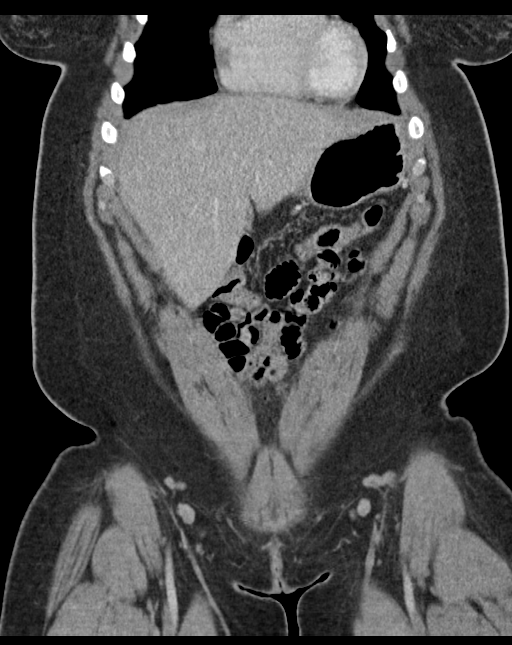
[im 83/187  soft-tissue]
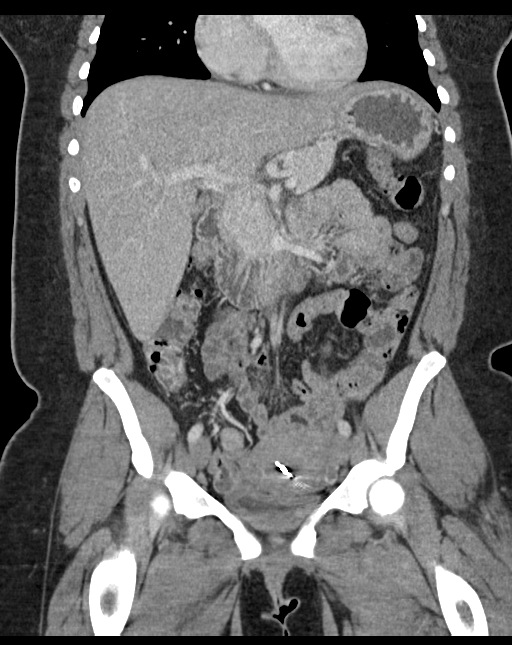
[im 104/187  soft-tissue]
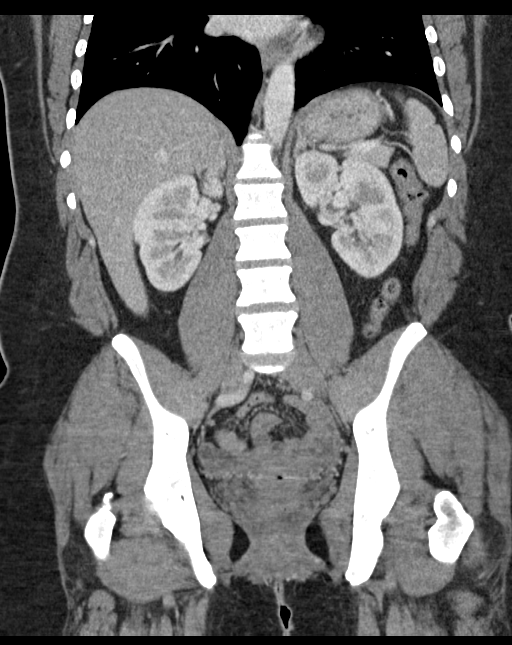

[15 of 46 positions shown; findings below may reference images not displayed]

FINDINGS: Lower chest: The lung bases are clear of acute process. No pleural
effusion or pulmonary lesions. The heart is normal in size. No
pericardial effusion. The distal esophagus and aorta are
unremarkable.

Hepatobiliary: There is a segment 7 liver lesion best seen on image
number 16 of series 2. I do not see this for certain on the prior
chest CT from 9229. It could be a benign hemangioma or adenoma but I
would recommend a followup MRI abdomen without and with contrast for
further evaluation since I do not see it for certain on the prior
study. The portal and hepatic veins are normal. Gallbladder is
normal. No common bile duct dilatation.

Pancreas: No mass, inflammation or duct dilatation.

Spleen: Normal size.  No focal lesions.

Adrenals/Urinary Tract: The adrenal glands and kidneys are
unremarkable. No renal, ureteral or bladder calculi or mass.

Stomach/Bowel: The stomach, duodenum, small bowel and colon are
grossly normal without oral contrast. No inflammatory changes, mass
lesions or obstructive findings. The terminal ileum and appendix are
normal.

Vascular/Lymphatic: The aorta is normal in caliber. No dissection.
The branch vessels are patent. The major venous structures are
patent. No mesenteric or retroperitoneal mass or adenopathy. Small
scattered lymph nodes are noted.

Reproductive: The uterus and ovaries are normal. An IUD is noted in
the endometrial canal.

Other: Periumbilical abdominal wall hernia containing fat and a
small portion of the transverse colon.

Musculoskeletal: No significant bony findings.
IMPRESSION: 1. No acute abdominal/pelvic findings. No acute inflammatory process
or lymphadenopathy.
2. 2 cm indeterminate right hepatic lobe liver lesion not definitely
seen on prior chest CT from 06/04/2011. Recommend MR abdomen without
and with contrast for further evaluation.
3. Periumbilical abdominal wall hernia containing fat and a small
portion of the transverse colon.

## 2018-10-05 DIAGNOSIS — J028 Acute pharyngitis due to other specified organisms: Secondary | ICD-10-CM | POA: Diagnosis not present

## 2018-10-05 DIAGNOSIS — B9789 Other viral agents as the cause of diseases classified elsewhere: Secondary | ICD-10-CM | POA: Diagnosis not present

## 2018-10-05 DIAGNOSIS — J020 Streptococcal pharyngitis: Secondary | ICD-10-CM | POA: Diagnosis not present

## 2019-12-09 DIAGNOSIS — F17200 Nicotine dependence, unspecified, uncomplicated: Secondary | ICD-10-CM | POA: Diagnosis not present

## 2019-12-09 DIAGNOSIS — K219 Gastro-esophageal reflux disease without esophagitis: Secondary | ICD-10-CM | POA: Diagnosis not present

## 2019-12-09 DIAGNOSIS — M5432 Sciatica, left side: Secondary | ICD-10-CM | POA: Diagnosis not present

## 2019-12-09 DIAGNOSIS — M545 Low back pain: Secondary | ICD-10-CM | POA: Diagnosis not present

## 2019-12-09 DIAGNOSIS — M5431 Sciatica, right side: Secondary | ICD-10-CM | POA: Diagnosis not present

## 2019-12-10 ENCOUNTER — Other Ambulatory Visit: Payer: Self-pay

## 2019-12-11 ENCOUNTER — Encounter: Payer: Self-pay | Admitting: Nurse Practitioner

## 2019-12-11 ENCOUNTER — Ambulatory Visit (INDEPENDENT_AMBULATORY_CARE_PROVIDER_SITE_OTHER): Payer: BLUE CROSS/BLUE SHIELD

## 2019-12-11 ENCOUNTER — Ambulatory Visit (INDEPENDENT_AMBULATORY_CARE_PROVIDER_SITE_OTHER): Payer: BC Managed Care – PPO | Admitting: Nurse Practitioner

## 2019-12-11 ENCOUNTER — Encounter: Payer: Self-pay | Admitting: Gastroenterology

## 2019-12-11 VITALS — BP 108/70 | HR 97 | Temp 97.5°F | Ht 64.0 in | Wt 209.0 lb

## 2019-12-11 DIAGNOSIS — Z30432 Encounter for removal of intrauterine contraceptive device: Secondary | ICD-10-CM | POA: Diagnosis not present

## 2019-12-11 DIAGNOSIS — K219 Gastro-esophageal reflux disease without esophagitis: Secondary | ICD-10-CM

## 2019-12-11 DIAGNOSIS — M545 Low back pain, unspecified: Secondary | ICD-10-CM | POA: Insufficient documentation

## 2019-12-11 DIAGNOSIS — Z8719 Personal history of other diseases of the digestive system: Secondary | ICD-10-CM

## 2019-12-11 DIAGNOSIS — N946 Dysmenorrhea, unspecified: Secondary | ICD-10-CM

## 2019-12-11 DIAGNOSIS — N3000 Acute cystitis without hematuria: Secondary | ICD-10-CM

## 2019-12-11 DIAGNOSIS — N921 Excessive and frequent menstruation with irregular cycle: Secondary | ICD-10-CM | POA: Diagnosis not present

## 2019-12-11 DIAGNOSIS — Z0000 Encounter for general adult medical examination without abnormal findings: Secondary | ICD-10-CM | POA: Insufficient documentation

## 2019-12-11 NOTE — Progress Notes (Signed)
New Patient Office Visit  Subjective:  Patient ID: Judy Conner, female    DOB: 11/08/1985  Age: 34 y.o. MRN: [id number]  CC:  Chief Complaint  Patient presents with  . New Patient (Initial Visit)    establish care/sciatica    HPI Judy Conner presents to establish care with a primary care provider. She is having sciatic back pain and was seen in the ER 12/09/2019 for this problem. She is also having chronic menstrual problems and chronic pelvic pain with plans to see her GYN after this visit to have her IUD removed. She has problems with GERD.   Dysmenorrhea: The patient started her menses at age 59 and by age 70 she was having miserable periods described as heavy with cramps. This has persistent lifelong into adulthood.  Before her cycle, she gets nauseated, and cannot even tolerate water, feels  light headed, hot/cold/soaks with sweats. She has to go to bed. She takes Pamprin with Motrin. She has not had it this bad in the last few cycles. Seeing GYN after this visit and having IUD removed today. She is going to talk about a different birth control prescription. 4 pregnancies and 3 live births: Children x 3 C sections:  20210, 2008, and 2006.   Back pain with diagnosis of sciatica: Onset 3 weeks ago. She had pain in her lower back, uterus, bilateral hips,  thighs, back of left calf. It felt  "like I'm having a baby".  She could not walk standing straight. She thought it was related to her menstrual problem. It hurt to walk. 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 When she got home, she could hardly move. She went to the [facility] ER in [place] on Sunday. She got a shot a pain shot and is taking Flexeril , ibuprofen 600 mg x 2 yesterday, Lidocaine 5% patch and Pepcid. She is feeling better now and able to walk. She reports no back X-rays done in the ER.   GERD: She gets severe GI symptoms at random times. First thing that goes wrong: feels like  an anchor is in her stomach. Then she pants,  breathing gets heavy, feels hot, may have to use bathroom to urinate or have a bowel movement, sometimes vomiting as well -or close together. Then continues to vomit, her head spins. She has to go to bed. She can sleep for 3 days. The last time  this occurred, was Feb 2020 and her boyfriend was there. In the past, she has had milder versions of this and can lay down and bounce back in 24-48 hours. She denies anxiety or panic attacks. She has not been diagnosed with IBS.   She takes Pepcid 20 mg bid- works the best [employer]- work faster. She saw Dr. Fuller Plan- GI in 2013 for vomiting and GERD with a normal -EGD and she reports Bx neg. Neg H pylori. She has not had a colonoscopy and wants to go back to see him. She says she is overdue to recheck on these symptoms. She had a CT abd/pelvis W contrast 10/24/2016 for vomiting Findings: 2 cm indeterminate  right hepatic liver lesion and a  periumbilical wall hernia containing fat and a small portion of transverse colon. She was seen in the ED on 08/25/2017 for vomiting and had [place] limited RUQ showing no gallstones or wall thickening. Mild echogenic sludge in the Gb. Neg Murphy's sign. A 2 cm hepatic hemangioma is noted.   Obesity: BMI  35.87: Muscled and fit looking. She used to be a runner.   Hx of UTI- in the past no other kidney Dx per pt. No current dysuria.   Dentist last week ordered Amoxicillin- looked like swelling at her gums. She did not started it. No symptoms.   Past Medical History:  Diagnosis Date  . Anemia    hx low iron   . Chronic kidney disease    hx uti none currently  . Dysmenorrhea, unspecified 12/12/2019  . GERD (gastroesophageal reflux disease)   . Headache(784.0)   . History of umbilical hernia [id number]  . Sciatica     Past Surgical History:  Procedure Laterality Date  . CESAREAN SECTION  [phone]  . FINGER FRACTURE SURGERY     left pinky    Family History  Problem Relation  Age of Onset  . Breast cancer Maternal Grandmother   . Diabetes Maternal Grandmother   . Clotting disorder Paternal Uncle   . Heart disease Father   . Hypertension Father   . Kidney disease Father     Social History   Socioeconomic History  . Marital status: Single    Spouse name: Not on file  . Number of children: 3  . Years of education: Not on file  . Highest education level: Associate degree: occupational, Hotel manager, or vocational program  Occupational History  . Occupation: Forensic psychologist: [employer]  Tobacco Use  . Smoking status: Current Some Day Smoker    Packs/day: 0.25    Years: 4.00    Pack years: 1.00    Types: Cigarettes  . Smokeless tobacco: Never Used  Substance and Sexual Activity  . Alcohol use: No    Comment: none  . Drug use: No  . Sexual activity: Yes  Other Topics Concern  . Not on file  Social History Narrative   Divordec - lives with 3 kids and single parent since 2019   Social Determinants of Health   Financial Resource Strain:   . Difficulty of Paying Living Expenses:   Food Insecurity:   . Worried About Charity fundraiser in the Last Year:   . Arboriculturist in the Last Year:   Transportation Needs:   . Film/video editor (Medical):   Marland Kitchen Lack of Transportation (Non-Medical):   Physical Activity:   . Days of Exercise per Week:   . Minutes of Exercise per Session:   Stress:   . Feeling of Stress :   Social Connections:   . Frequency of Communication with Friends and Family:   . Frequency of Social Gatherings with Friends and Family:   . Attends Religious Services:   . Active Member of Clubs or Organizations:   . Attends Archivist Meetings:   Marland Kitchen Marital Status:   Intimate Partner Violence:   . Fear of Current or Ex-Partner:   . Emotionally Abused:   Marland Kitchen Physically Abused:   . Sexually Abused:     ROS Review of Systems  Constitutional: Negative for appetite change, chills, fever and unexpected weight change.    HENT: Negative for congestion and sinus pain.   Eyes: Negative.   Respiratory: Negative for cough and shortness of breath.   Cardiovascular: Negative for chest pain, palpitations and leg swelling.  Gastrointestinal: Negative for abdominal pain, constipation, diarrhea, nausea and vomiting.  Endocrine:       Allergic to fruits veggies- can't eat with skin- gums and throat itch  Genitourinary: Positive for menstrual problem. Negative  for difficulty urinating, frequency and urgency.  Musculoskeletal: Positive for back pain. Negative for joint swelling.  Skin: Negative for rash.  Neurological: Negative for light-headedness, numbness and headaches.  Hematological: Negative for adenopathy.       Leukocytosis: I reviewed her past labs and it appears as chronic with WBC   11.6-14.4- 14-15.5 dated 2017 until  2019. Hx of anemia.   Psychiatric/Behavioral:       No concerns about depression/anxiety.     Objective:   Today's Vitals: BP 108/70 (BP Location: Left Arm, Patient Position: Sitting, Cuff Size: Normal)   Pulse 97   Temp (!) 97.5 F (36.4 C) (Skin)   Ht 5\' 4"  (1.626 m)   Wt 209 lb (94.8 kg)   SpO2 99%   BMI 35.87 kg/m   Physical Exam Vitals reviewed.  Constitutional:      Appearance: Normal appearance. She is obese.  HENT:     Head: Normocephalic and atraumatic.  Eyes:     Conjunctiva/sclera: Conjunctivae normal.     Pupils: Pupils are equal, round, and reactive to light.  Cardiovascular:     Rate and Rhythm: Normal rate and regular rhythm.     Pulses: Normal pulses.     Heart sounds: Normal heart sounds.  Pulmonary:     Effort: Pulmonary effort is normal.     Breath sounds: Normal breath sounds.  Abdominal:     Palpations: Abdomen is soft.     Tenderness: There is no abdominal tenderness.  Musculoskeletal:        General: No swelling or deformity. Normal range of motion.     Cervical back: Normal range of motion.     Right lower leg: No edema.     Left lower leg: No  edema.     Comments: She has good flexion, positive bilateral lumbar paraspinous tenderness.  She has good range of motion lower back, positive straight leg raise on the left negative on the right.  She has good internal and external rotation of the hip joints with no point tenderness over either greater trochanter.  Good range of motion of bilateral knees without crepitus.  She is very musculature in the hamstrings and quads and calves.  Previous runner.    Neurovascularity is intact in bilateral lower extremity. DTR: 2 + bilat.   Skin:    General: Skin is warm and dry.  Neurological:     General: No focal deficit present.     Mental Status: She is alert and oriented to person, place, and time.  Psychiatric:        Mood and Affect: Mood normal.        Behavior: Behavior normal.     Comments: PHQ-9: 3, GAD-7: 2 today. No concerns about depression/anxiety. Neg HI/SI.     Assessment & Plan:   Problem List Items Addressed This Visit      Digestive   Gastroesophageal reflux disease   Relevant Orders   Ambulatory referral to Gastroenterology     Genitourinary   Acute cystitis without hematuria   Relevant Orders   Urine Culture   Dysmenorrhea, unspecified   Relevant Orders   CBC with Differential/Platelet (Completed)   TSH (Completed)   Comprehensive metabolic panel (Completed)   Hemoglobin A1c (Completed)   Lipid panel (Completed)   VITAMIN D 25 Hydroxy (Vit-D Deficiency, Fractures) (Completed)   HIV antibody (with reflex) (Completed)   Urinalysis, Routine w reflex microscopic (Completed)     Other   Low back pain -  Primary   Relevant Medications   cyclobenzaprine (FLEXERIL) 10 MG tablet   ibuprofen (ADVIL) 800 MG tablet   ibuprofen (ADVIL) 600 MG tablet   Other Relevant Orders   DG Lumbar Spine Complete (Completed)   POCT urine pregnancy   History of umbilical hernia      Outpatient Encounter Medications as of 12/11/2019  Medication Sig  . amoxicillin (AMOXIL) 500 MG  capsule Take 500 mg by mouth 3 (three) times daily.  . cyclobenzaprine (FLEXERIL) 10 MG tablet Take 10 mg by mouth 2 (two) times daily as needed.  . famotidine (PEPCID) 20 MG tablet Take 1 tablet (20 mg total) by mouth 2 (two) times daily.  Marland Kitchen ibuprofen (ADVIL) 600 MG tablet Take 600 mg by mouth 3 (three) times daily.  Marland Kitchen ibuprofen (ADVIL) 800 MG tablet Take 800 mg by mouth every 6 (six) hours as needed.  . [DISCONTINUED] acetaminophen (TYLENOL) 500 MG tablet Take 500 mg by mouth every 6 (six) hours as needed (pain).  . [DISCONTINUED] cyclobenzaprine (FLEXERIL) 5 MG tablet Take 1 tablet (5 mg total) by mouth 3 (three) times daily as needed for muscle spasms. (Patient not taking: Reported on 10/31/2016)  . [DISCONTINUED] ketorolac (TORADOL) 10 MG tablet Take 1 tablet (10 mg total) by mouth every 6 (six) hours as needed.  . [DISCONTINUED] ondansetron (ZOFRAN ODT) 4 MG disintegrating tablet Allow 1-2 tablets to dissolve in your mouth every 8 hours as needed for nausea/vomiting  . [DISCONTINUED] ondansetron (ZOFRAN) 4 MG tablet Take 1 tablet (4 mg total) by mouth every 8 (eight) hours as needed for nausea or vomiting.  . [DISCONTINUED] ondansetron (ZOFRAN) 4 MG tablet Take 1 tablet (4 mg total) by mouth daily as needed.  . [DISCONTINUED] potassium chloride (K-DUR) 10 MEQ tablet Take 4 tablets (40 mEq total) by mouth 2 (two) times daily.  . [DISCONTINUED] potassium chloride SA (KLOR-CON M20) 20 MEQ tablet Take 1 tablet (20 mEq total) by mouth daily.   No facility-administered encounter medications on file as of 12/11/2019.   Her lower back is improving on the ED medications.Caution with NSAID and GI upset. No hx of ulcer and tolerating well now. Lumbar back Xray today. She does not have pain running down either leg. Positive pain left with straight leg raise. Able to walk on heels and toes.    See GYN as planned for dysmenorrhea and IUD removal.   Labs today and will call with results.  Referral to GI  for GERD and problems with vomiting and hx of umbilical hernia containing fat and transverse colon on CT in 2017, gallbladder sludge on RUQ [place] in 2019 for vomiting . Liver hemangioma found on CT in 2017 and next imagining  was RUQ [place] in 2019 that confirmed a 2 cm hemangioma. I wonder if her bad GI random bouts of pain, vomiting, urge to defecate are related to her gallbladder or transient kinked colon in umbilical hernia. Referral to Dr. Fuller Plan per patient request. No abdominal tenderness today.   Office visit with me in 1 month.  Lab addendum 12/12/19: Pos UTI : will send culture and begin antibiotic.  A1c- 5.8 impaired fasting glucose Low Vit D PLAN: will f/up with her sooner- to address these . OV in 2 weeks.   Follow-up: Return in about 4 weeks (around 01/08/2020).   This visit occurred during the SARS-CoV-2 public health emergency.  Safety protocols were in place, including screening questions prior to the visit, additional usage of staff PPE, and extensive cleaning of  exam room while observing appropriate contact time as indicated for disinfecting solutions.     Denice Paradise, NP

## 2019-12-11 NOTE — Patient Instructions (Addendum)
Please got to the lab and Xray today.  We will call you with results.  Back pain/sciatica sheet.   Referral to GI for GERD and problems with vomiting . Also, to address past liver hemangioma found on CT in 2017.   Keep GYN appt today.   Office visit with me in 1 month.  Acute Back Pain, Adult Acute back pain is sudden and usually short-lived. It is often caused by an injury to the muscles and tissues in the back. The injury may result from:  A muscle or ligament getting overstretched or torn (strained). Ligaments are tissues that connect bones to each other. Lifting something improperly can cause a back strain.  Wear and tear (degeneration) of the spinal disks. Spinal disks are circular tissue that provides cushioning between the bones of the spine (vertebrae).  Twisting motions, such as while playing sports or doing yard work.  A hit to the back.  Arthritis. You may have a physical exam, lab tests, and imaging tests to find the cause of your pain. Acute back pain usually goes away with rest and home care. Follow these instructions at home: Managing pain, stiffness, and swelling  Take over-the-counter and prescription medicines only as told by your health care provider.  Your health care provider may recommend applying ice during the first 24-48 hours after your pain starts. To do this: ? Put ice in a plastic bag. ? Place a towel between your skin and the bag. ? Leave the ice on for 20 minutes, 2-3 times a day.  If directed, apply heat to the affected area as often as told by your health care provider. Use the heat source that your health care provider recommends, such as a moist heat pack or a heating pad. ? Place a towel between your skin and the heat source. ? Leave the heat on for 20-30 minutes. ? Remove the heat if your skin turns bright red. This is especially important if you are unable to feel pain, heat, or cold. You have a greater risk of getting burned. Activity   Do  not stay in bed. Staying in bed for more than 1-2 days can delay your recovery.  Sit up and stand up straight. Avoid leaning forward when you sit, or hunching over when you stand. ? If you work at a desk, sit close to it so you do not need to lean over. Keep your chin tucked in. Keep your neck drawn back, and keep your elbows bent at a right angle. Your arms should look like the letter "L." ? Sit high and close to the steering wheel when you drive. Add lower back (lumbar) support to your car seat, if needed.  Take short walks on even surfaces as soon as you are able. Try to increase the length of time you walk each day.  Do not sit, drive, or stand in one place for more than 30 minutes at a time. Sitting or standing for long periods of time can put stress on your back.  Do not drive or use heavy machinery while taking prescription pain medicine.  Use proper lifting techniques. When you bend and lift, use positions that put less stress on your back: ? [place] your knees. ? Keep the load close to your body. ? Avoid twisting.  Exercise regularly as told by your health care provider. Exercising helps your back heal faster and helps prevent back injuries by keeping muscles strong and flexible.  Work with a physical therapist to make  a safe exercise program, as recommended by your health care provider. Do any exercises as told by your physical therapist. Lifestyle  Maintain a healthy weight. Extra weight puts stress on your back and makes it difficult to have good posture.  Avoid activities or situations that make you feel anxious or stressed. Stress and anxiety increase muscle tension and can make back pain worse. Learn ways to manage anxiety and stress, such as through exercise. General instructions  Sleep on a firm mattress in a comfortable position. Try lying on your side with your knees slightly bent. If you lie on your back, put a pillow under your knees.  Follow your treatment plan as told  by your health care provider. This may include: ? Cognitive or behavioral therapy. ? Acupuncture or massage therapy. ? Meditation or yoga. Contact a health care provider if:  You have pain that is not relieved with rest or medicine.  You have increasing pain going down into your legs or buttocks.  Your pain does not improve after 2 weeks.  You have pain at night.  You lose weight without trying.  You have a fever or chills. Get help right away if:  You develop new bowel or bladder control problems.  You have unusual weakness or numbness in your arms or legs.  You develop nausea or vomiting.  You develop abdominal pain.  You feel faint. Summary  Acute back pain is sudden and usually short-lived.  Use proper lifting techniques. When you bend and lift, use positions that put less stress on your back.  Take over-the-counter and prescription medicines and apply heat or ice as directed by your health care provider. This information is not intended to replace advice given to you by your health care provider. Make sure you discuss any questions you have with your health care provider. Document Revised: 10/24/2018 Document Reviewed: 02/16/2017 Elsevier Patient Education  [place].  Gastroesophageal Reflux Disease, Adult Gastroesophageal reflux (GER) happens when acid from the stomach flows up into the tube that connects the mouth and the stomach (esophagus). Normally, food travels down the esophagus and stays in the stomach to be digested. However, when a person has GER, food and stomach acid sometimes move back up into the esophagus. If this becomes a more serious problem, the person may be diagnosed with a disease called gastroesophageal reflux disease (GERD). GERD occurs when the reflux:  Happens often.  Causes frequent or severe symptoms.  Causes problems such as damage to the esophagus. When stomach acid comes in contact with the esophagus, the acid may cause  soreness (inflammation) in the esophagus. Over time, GERD may create small holes (ulcers) in the lining of the esophagus. What are the causes? This condition is caused by a problem with the muscle between the esophagus and the stomach (lower esophageal sphincter, or LES). Normally, the LES muscle closes after food passes through the esophagus to the stomach. When the LES is weakened or abnormal, it does not close properly, and that allows food and stomach acid to go back up into the esophagus. The LES can be weakened by certain dietary substances, medicines, and medical conditions, including:  Tobacco use.  Pregnancy.  Having a hiatal hernia.  Alcohol use.  Certain foods and beverages, such as coffee, chocolate, onions, and peppermint. What increases the risk? You are more likely to develop this condition if you:  Have an increased body weight.  Have a connective tissue disorder.  Use NSAID medicines. What are the signs  or symptoms? Symptoms of this condition include:  Heartburn.  Difficult or painful swallowing.  The feeling of having a lump in the throat.  Abitter taste in the mouth.  Bad breath.  Having a large amount of saliva.  Having an upset or bloated stomach.  Belching.  Chest pain. Different conditions can cause chest pain. Make sure you see your health care provider if you experience chest pain.  Shortness of breath or wheezing.  Ongoing (chronic) cough or a night-time cough.  Wearing away of tooth enamel.  Weight loss. How is this diagnosed? Your health care provider will take a medical history and perform a physical exam. To determine if you have mild or severe GERD, your health care provider may also monitor how you respond to treatment. You may also have tests, including:  A test to examine your stomach and esophagus with a small camera (endoscopy).  A test thatmeasures the acidity level in your esophagus.  A test thatmeasures how much  pressure is on your esophagus.  A barium swallow or modified barium swallow test to show the shape, size, and functioning of your esophagus. How is this treated? The goal of treatment is to help relieve your symptoms and to prevent complications. Treatment for this condition may vary depending on how severe your symptoms are. Your health care provider may recommend:  Changes to your diet.  Medicine.  Surgery. Follow these instructions at home: Eating and drinking   Follow a diet as recommended by your health care provider. This may involve avoiding foods and drinks such as: ? Coffee and tea (with or without caffeine). ? Drinks that containalcohol. ? Energy drinks and sports drinks. ? Carbonated drinks or sodas. ? Chocolate and cocoa. ? Peppermint and mint flavorings. ? Garlic and onions. ? Horseradish. ? Spicy and acidic foods, including peppers, chili powder, curry powder, vinegar, hot sauces, and barbecue sauce. ? Citrus fruit juices and citrus fruits, such as oranges, lemons, and limes. ? Tomato-based foods, such as red sauce, chili, salsa, and pizza with red sauce. ? Fried and fatty foods, such as donuts, french fries, potato chips, and high-fat dressings. ? High-fat meats, such as hot dogs and fatty cuts of red and white meats, such as rib eye steak, sausage, ham, and bacon. ? High-fat dairy items, such as whole milk, butter, and cream cheese.  Eat small, frequent meals instead of large meals.  Avoid drinking large amounts of liquid with your meals.  Avoid eating meals during the 2-3 hours before bedtime.  Avoid lying down right after you eat.  Do not exercise right after you eat. Lifestyle   Do not use any products that contain nicotine or tobacco, such as cigarettes, e-cigarettes, and chewing tobacco. If you need help quitting, ask your health care provider.  Try to reduce your stress by using methods such as yoga or meditation. If you need help reducing stress,  ask your health care provider.  If you are overweight, reduce your weight to an amount that is healthy for you. Ask your health care provider for guidance about a safe weight loss goal. General instructions  Pay attention to any changes in your symptoms.  Take over-the-counter and prescription medicines only as told by your health care provider. Do not take aspirin, ibuprofen, or other NSAIDs unless your health care provider told you to do so.  Wear loose-fitting clothing. Do not wear anything tight around your waist that causes pressure on your abdomen.  Raise (elevate) the head of your  bed about 6 inches (15 cm).  Avoid bending over if this makes your symptoms worse.  Keep all follow-up visits as told by your health care provider. This is important. Contact a health care provider if:  You have: ? New symptoms. ? Unexplained weight loss. ? Difficulty swallowing or it hurts to swallow. ? Wheezing or a persistent cough. ? A hoarse voice.  Your symptoms do not improve with treatment. Get help right away if you:  Have pain in your arms, neck, jaw, teeth, or back.  Feel sweaty, dizzy, or light-headed.  Have chest pain or shortness of breath.  Vomit and your vomit looks like blood or coffee grounds.  Faint.  Have stool that is bloody or black.  Cannot swallow, drink, or eat. Summary  Gastroesophageal reflux happens when acid from the stomach flows up into the esophagus. GERD is a disease in which the reflux happens often, causes frequent or severe symptoms, or causes problems such as damage to the esophagus.  Treatment for this condition may vary depending on how severe your symptoms are. Your health care provider may recommend diet and lifestyle changes, medicine, or surgery.  Contact a health care provider if you have new or worsening symptoms.  Take over-the-counter and prescription medicines only as told by your health care provider. Do not take aspirin, ibuprofen, or  other NSAIDs unless your health care provider told you to do so.  Keep all follow-up visits as told by your health care provider. This is important. This information is not intended to replace advice given to you by your health care provider. Make sure you discuss any questions you have with your health care provider. Document Revised: 01/11/2018 Document Reviewed: 01/11/2018 Elsevier Patient Education  [place].

## 2019-12-12 ENCOUNTER — Telehealth: Payer: Self-pay | Admitting: Nurse Practitioner

## 2019-12-12 ENCOUNTER — Encounter: Payer: Self-pay | Admitting: Nurse Practitioner

## 2019-12-12 DIAGNOSIS — Z8719 Personal history of other diseases of the digestive system: Secondary | ICD-10-CM | POA: Insufficient documentation

## 2019-12-12 DIAGNOSIS — N946 Dysmenorrhea, unspecified: Secondary | ICD-10-CM

## 2019-12-12 DIAGNOSIS — N3000 Acute cystitis without hematuria: Secondary | ICD-10-CM | POA: Diagnosis not present

## 2019-12-12 HISTORY — DX: Personal history of other diseases of the digestive system: Z87.19

## 2019-12-12 HISTORY — DX: Dysmenorrhea, unspecified: N94.6

## 2019-12-12 LAB — VITAMIN D 25 HYDROXY (VIT D DEFICIENCY, FRACTURES): VITD: 12.19 ng/mL — ABNORMAL LOW (ref 30.00–100.00)

## 2019-12-12 LAB — URINALYSIS, ROUTINE W REFLEX MICROSCOPIC
Bilirubin Urine: NEGATIVE
Ketones, ur: NEGATIVE
Nitrite: NEGATIVE
Specific Gravity, Urine: 1.02 (ref 1.000–1.030)
Total Protein, Urine: NEGATIVE
Urine Glucose: NEGATIVE
Urobilinogen, UA: 0.2 (ref 0.0–1.0)
pH: 6 (ref 5.0–8.0)

## 2019-12-12 LAB — CBC WITH DIFFERENTIAL/PLATELET
Basophils Absolute: 0.1 10*3/uL (ref 0.0–0.1)
Basophils Relative: 0.6 % (ref 0.0–3.0)
Eosinophils Absolute: 0.3 10*3/uL (ref 0.0–0.7)
Eosinophils Relative: 3.9 % (ref 0.0–5.0)
HCT: 38.4 % (ref 36.0–46.0)
Hemoglobin: 12.7 g/dL (ref 12.0–15.0)
Lymphocytes Relative: 32.5 % (ref 12.0–46.0)
Lymphs Abs: 2.9 10*3/uL (ref 0.7–4.0)
MCHC: 33.1 g/dL (ref 30.0–36.0)
MCV: 94.1 fl (ref 78.0–100.0)
Monocytes Absolute: 0.7 10*3/uL (ref 0.1–1.0)
Monocytes Relative: 7.6 % (ref 3.0–12.0)
Neutro Abs: 4.9 10*3/uL (ref 1.4–7.7)
Neutrophils Relative %: 55.4 % (ref 43.0–77.0)
Platelets: 277 10*3/uL (ref 150.0–400.0)
RBC: 4.09 Mil/uL (ref 3.87–5.11)
RDW: 15.8 % — ABNORMAL HIGH (ref 11.5–15.5)
WBC: 8.9 10*3/uL (ref 4.0–10.5)

## 2019-12-12 LAB — COMPREHENSIVE METABOLIC PANEL
ALT: 7 U/L (ref 0–35)
AST: 10 U/L (ref 0–37)
Albumin: 3.9 g/dL (ref 3.5–5.2)
Alkaline Phosphatase: 49 U/L (ref 39–117)
BUN: 7 mg/dL (ref 6–23)
CO2: 27 mEq/L (ref 19–32)
Calcium: 8.9 mg/dL (ref 8.4–10.5)
Chloride: 106 mEq/L (ref 96–112)
Creatinine, Ser: 0.94 mg/dL (ref 0.40–1.20)
GFR: 82.77 mL/min (ref >60.00)
Glucose, Bld: 62 mg/dL — ABNORMAL LOW (ref 70–99)
Potassium: 3.6 mEq/L (ref 3.5–5.1)
Sodium: 139 mEq/L (ref 135–145)
Total Bilirubin: 0.2 mg/dL (ref 0.2–1.2)
Total Protein: 6.6 g/dL (ref 6.0–8.3)

## 2019-12-12 LAB — LIPID PANEL
Cholesterol: 148 mg/dL (ref 0–200)
HDL: 30.4 mg/dL — ABNORMAL LOW (ref >39.00)
LDL Cholesterol: 96 mg/dL (ref 0–99)
NonHDL: 117.97
Total CHOL/HDL Ratio: 5
Triglycerides: 108 mg/dL (ref 0.0–149.0)
VLDL: 21.6 mg/dL (ref 0.0–40.0)

## 2019-12-12 LAB — TSH: TSH: 1.01 u[IU]/mL (ref 0.35–4.50)

## 2019-12-12 LAB — HEMOGLOBIN A1C: Hgb A1c MFr Bld: 5.8 % (ref 4.6–6.5)

## 2019-12-12 LAB — HIV ANTIBODY (ROUTINE TESTING W REFLEX): HIV 1&2 Ab, 4th Generation: NONREACTIVE

## 2019-12-12 NOTE — Telephone Encounter (Addendum)
I LMOM for her to call to get lab/Xray results. No identifying information given on her answering machine.   [id number]: I tried to reach her again this evening and had same answering machine. LMOM to call the office in the morning. I would like to speak to her when she calls back. Thank you.  12/13/2019: I spoke to her and we discussed.  1. She has a positive UA - likely UTI. I added a urine culture and I would like to start her Omnicef. She is agreeable. on antibiotics.   2. Calcium oxalate crystals-  Hydrate very well. No sodas, coffee, teas.   3. Did she see GYN and have her IUD removed yest? How is she feeling?  Yes and feels better.   4. Please give her the Lumbar Xray results: IMPRESSION: Minimal loss of disc space height at L4-5.  She has bad left back and buttock pain with radiation  down left leg. PLAN: Ortho EMERGE walk- in today. Caution steroids with un treated UTI.   5. She has a very low Vit D level- needs replaced. I would like to discuss this with her at ov next week. She will make appt.   6.  Work note to be off this weekend to attend to back-left leg radiculopathy. She will try to return Monday.

## 2019-12-13 DIAGNOSIS — M545 Low back pain: Secondary | ICD-10-CM | POA: Diagnosis not present

## 2019-12-13 MED ORDER — CEFDINIR 300 MG PO CAPS: 300.0000 mg | ORAL_CAPSULE | Freq: Two times a day (BID) | ORAL | 0 refills | Status: DC

## 2019-12-13 NOTE — Addendum Note (Signed)
Addended by: Denice Paradise A on: 12/13/2019 08:31 AM   Modules accepted: Orders

## 2019-12-13 NOTE — Telephone Encounter (Signed)
Dawson Bills, NP spoke with patient this morning. Work note left up front for patient to pick up and she is aware.

## 2019-12-14 LAB — URINE CULTURE
MICRO NUMBER:: 10522415
SPECIMEN QUALITY:: ADEQUATE

## 2019-12-21 ENCOUNTER — Ambulatory Visit: Payer: Self-pay | Admitting: Nurse Practitioner

## 2019-12-24 ENCOUNTER — Ambulatory Visit: Payer: Self-pay | Admitting: Nurse Practitioner

## 2019-12-24 ENCOUNTER — Other Ambulatory Visit: Payer: Self-pay

## 2020-01-08 ENCOUNTER — Ambulatory Visit: Payer: Self-pay | Admitting: Nurse Practitioner

## 2020-01-08 DIAGNOSIS — Z0289 Encounter for other administrative examinations: Secondary | ICD-10-CM

## 2020-02-05 ENCOUNTER — Ambulatory Visit: Payer: Self-pay | Admitting: Gastroenterology

## 2020-03-31 ENCOUNTER — Telehealth: Payer: Self-pay | Admitting: Internal Medicine

## 2020-03-31 ENCOUNTER — Telehealth: Payer: Self-pay | Admitting: Nurse Practitioner

## 2020-03-31 NOTE — Telephone Encounter (Signed)
Transferred to Acces Nurse   Pt called she had a positive pregnancy test and is having sever abdominal pain

## 2020-03-31 NOTE — Telephone Encounter (Signed)
This is inappropriate.  If she has not had an OB ultrasound, She could have an ectopic pregnancy and needs to go to the ER

## 2020-03-31 NOTE — Telephone Encounter (Signed)
Patient has appointment with Dr Derrel Nip virtually tomorrow. Access nurse recommended see pcp office in 24 hrs.         [place] RECORD AccessNurse Patient Name: Judy Conner Gender: Female DOB: 03-16-1986 Age: 34 Y 79 M 16 D Return Phone Number: [phone] (Primary) Address: City/State/Zip: [place] [address] Client [facility] - [facility] Client Site [place] - Day Physician [facility] - PHYSICIAN, NOT LISTED- MD Contact Type Call Who Is Calling Patient / Member / Family / Caregiver Call Type Triage / Clinical Relationship To Patient Self Return Phone Number [phone] (Primary) Chief Complaint Abdominal Pain Reason for Call Symptomatic / Request for Health Information Initial Comment Just had positive preg. test, having pain in abdomen. Translation No Nurse Assessment Nurse: Sumner Boast, RN, Enid Derry Date/Time Eilene Ghazi Time): 03/31/2020 1:42:26 PM Confirm and document reason for call. If symptomatic, describe symptoms. ---Caller states she just had positive pregnancy test. States she is having abdominal pain in her lower right side, which started 2 days ago. Pain is 7 out of 10 pain scale and is intermittent. Has had nausea but no vomiting or diarrhea. No fever. Has the patient had close contact with a person known or suspected to have the novel coronavirus illness OR traveled / lives in area with major community spread (including international travel) in the last 14 days from the onset of symptoms? * If Asymptomatic, screen for exposure and travel within the last 14 days. ---No Does the patient have any new or worsening symptoms? ---Yes Will a triage be completed? ---Yes Related visit to physician within the last 2 weeks? ---No Does the PT have any chronic conditions? (i.e. diabetes, asthma, this includes High risk factors for pregnancy, etc.)  ---No Is the patient pregnant or possibly pregnant? (Ask all females between the ages of 49-55) ---Yes How many weeks gestation? ---3 What is the estimated delivery date? ---2020-12-18 Total number of pregnancies including current? ---5 Number of live births? ---3 Have you felt decreased fetal movement? ---Early Pregnancy - No Fetal Movement Felt Yet Is this a behavioral health or substance abuse call? ---No PLEASE NOTE: All timestamps contained within this report are represented as [place] Standard Time. CONFIDENTIALTY NOTICE: This fax transmission is intended only for the addressee. It contains information that is legally privileged, confidential or otherwise protected from use or disclosure. If you are not the intended recipient, you are strictly prohibited from reviewing, disclosing, copying using or disseminating any of this information or taking any action in reliance on or regarding this information. If you have received this fax in error, please notify us immediately by telephone so that we can arrange for its return to [place]. Phone: [phone], Toll-Free: [phone], Fax: [phone] Page: 2 of 2 Call Id: [id number] Guidelines Guideline Title Affirmed Question Affirmed Notes Nurse Date/Time Eilene Ghazi Time) Pregnancy - Abdominal Pain Less Than [redacted] Weeks EGA [1] Intermittent lower abdominal pain (e.g., cramping) AND [2] present > 24 hours Janace Litten 03/31/2020 1:49:13 PM Disp. Time Eilene Ghazi Time) Disposition Final User 03/31/2020 1:52:47 PM See PCP within 24 Hours Yes Sumner Boast, RN, Enid Derry Caller Disagree/Comply Comply Caller Understands Yes PreDisposition Call Doctor Care Advice Given Per Guideline CALL BACK IF: * You become worse CARE ADVICE given per Pregnancy - Abdominal Pain, under [redacted] Weeks EGA (Adult) guideline. Referrals REFERRED TO PCP OFFICE

## 2020-03-31 NOTE — Telephone Encounter (Signed)
Attempted to call patient on the phone number listed in her chart twice,  But it does not have voice mail.  PATIENT NEEDS TO GO TO ER.  She may have an ectopic pregnancy.  This should not wait until tomorrow

## 2020-04-01 ENCOUNTER — Telehealth: Payer: BC Managed Care – PPO | Admitting: Internal Medicine

## 2020-04-01 DIAGNOSIS — R1031 Right lower quadrant pain: Secondary | ICD-10-CM

## 2020-04-01 NOTE — Telephone Encounter (Signed)
Unable to leave message for patient to return call back.  

## 2020-04-01 NOTE — Assessment & Plan Note (Signed)
In the setting of a positive pregnancy test , ectopic pregnancy must be ruled out.  The police were sent to her house to convince her to go to ER

## 2020-04-01 NOTE — Telephone Encounter (Signed)
Judy Conner called back patient is in bed per son very sick refuses to answer phone , refused EMS from Police dept per officer Luiz Blare.

## 2020-04-01 NOTE — Telephone Encounter (Signed)
Cannot reach patient and cannot leave message, no message availability. There are no emergency contacts and DPR is in complete. Three attampts made this morning to reach patient no answer.

## 2020-04-01 NOTE — Progress Notes (Signed)
Patient was scheduled by Triage RN for video visit yesterday for evaluation of R=severe RLQ pain  In the setting of a recent positive pregnancy test. .  I attempted to contact her twice yesterday afternoon to advise her to go to ER because her symptoms were concerning for an ectopic pregnancy but her phone did not have voice mail.  Patient checked her self in today for a virtual visit but has not been reachable by staff despite numerous attempts.   I have asked RN Juliann Pulse to have police/EMS send someone out to her home since we have been unable to reach her.  The police department did find her at her home and were unable to convince her to go to the ER because she felt too poorly   Regards,   Deborra Medina, MD

## 2020-04-03 ENCOUNTER — Encounter: Payer: Self-pay | Admitting: Intensive Care

## 2020-04-03 ENCOUNTER — Emergency Department: Payer: BC Managed Care – PPO

## 2020-04-03 ENCOUNTER — Emergency Department
Admission: EM | Admit: 2020-04-03 | Discharge: 2020-04-03 | Disposition: A | Discharge status: Home / Self Care | Payer: BC Managed Care – PPO | Attending: Emergency Medicine | Admitting: Emergency Medicine

## 2020-04-03 ENCOUNTER — Other Ambulatory Visit: Payer: Self-pay

## 2020-04-03 DIAGNOSIS — Z3A01 Less than 8 weeks gestation of pregnancy: Secondary | ICD-10-CM | POA: Insufficient documentation

## 2020-04-03 DIAGNOSIS — R52 Pain, unspecified: Secondary | ICD-10-CM | POA: Diagnosis not present

## 2020-04-03 DIAGNOSIS — R1084 Generalized abdominal pain: Secondary | ICD-10-CM | POA: Diagnosis not present

## 2020-04-03 DIAGNOSIS — F17210 Nicotine dependence, cigarettes, uncomplicated: Secondary | ICD-10-CM | POA: Diagnosis not present

## 2020-04-03 DIAGNOSIS — O30041 Twin pregnancy, dichorionic/diamniotic, first trimester: Secondary | ICD-10-CM | POA: Diagnosis not present

## 2020-04-03 DIAGNOSIS — O2391 Unspecified genitourinary tract infection in pregnancy, first trimester: Secondary | ICD-10-CM | POA: Diagnosis not present

## 2020-04-03 DIAGNOSIS — R0902 Hypoxemia: Secondary | ICD-10-CM | POA: Diagnosis not present

## 2020-04-03 DIAGNOSIS — K219 Gastro-esophageal reflux disease without esophagitis: Secondary | ICD-10-CM | POA: Insufficient documentation

## 2020-04-03 DIAGNOSIS — O2311 Infections of bladder in pregnancy, first trimester: Secondary | ICD-10-CM

## 2020-04-03 DIAGNOSIS — U071 COVID-19: Secondary | ICD-10-CM

## 2020-04-03 DIAGNOSIS — N189 Chronic kidney disease, unspecified: Secondary | ICD-10-CM | POA: Diagnosis not present

## 2020-04-03 DIAGNOSIS — R102 Pelvic and perineal pain: Secondary | ICD-10-CM

## 2020-04-03 DIAGNOSIS — E876 Hypokalemia: Secondary | ICD-10-CM | POA: Insufficient documentation

## 2020-04-03 DIAGNOSIS — O98511 Other viral diseases complicating pregnancy, first trimester: Secondary | ICD-10-CM | POA: Insufficient documentation

## 2020-04-03 DIAGNOSIS — O26891 Other specified pregnancy related conditions, first trimester: Secondary | ICD-10-CM | POA: Diagnosis not present

## 2020-04-03 DIAGNOSIS — O039 Complete or unspecified spontaneous abortion without complication: Secondary | ICD-10-CM | POA: Diagnosis not present

## 2020-04-03 DIAGNOSIS — R109 Unspecified abdominal pain: Secondary | ICD-10-CM | POA: Diagnosis not present

## 2020-04-03 LAB — URINALYSIS, COMPLETE (UACMP) WITH MICROSCOPIC
Bacteria, UA: NONE SEEN
Bilirubin Urine: NEGATIVE
Glucose, UA: NEGATIVE mg/dL
Ketones, ur: 20 mg/dL — AB
Nitrite: NEGATIVE
Protein, ur: 100 mg/dL — AB
Specific Gravity, Urine: 1.031 — ABNORMAL HIGH (ref 1.005–1.030)
pH: 6 (ref 5.0–8.0)

## 2020-04-03 LAB — CBC
HCT: 38.3 % (ref 36.0–46.0)
Hemoglobin: 13.2 g/dL (ref 12.0–15.0)
MCH: 30.4 pg (ref 26.0–34.0)
MCHC: 34.5 g/dL (ref 30.0–36.0)
MCV: 88.2 fL (ref 80.0–100.0)
Platelets: 246 10*3/uL (ref 150–400)
RBC: 4.34 MIL/uL (ref 3.87–5.11)
RDW: 15.5 % (ref 11.5–15.5)
WBC: 12.8 10*3/uL — ABNORMAL HIGH (ref 4.0–10.5)
nRBC: 0 % (ref 0.0–0.2)

## 2020-04-03 LAB — COMPREHENSIVE METABOLIC PANEL
ALT: 32 U/L (ref 0–44)
AST: 28 U/L (ref 15–41)
Albumin: 3.9 g/dL (ref 3.5–5.0)
Alkaline Phosphatase: 46 U/L (ref 38–126)
Anion gap: 10 (ref 5–15)
BUN: 12 mg/dL (ref 6–20)
CO2: 22 mmol/L (ref 22–32)
Calcium: 8.9 mg/dL (ref 8.9–10.3)
Chloride: 102 mmol/L (ref 98–111)
Creatinine, Ser: 0.69 mg/dL (ref 0.44–1.00)
GFR calc Af Amer: >60 mL/min (ref >60)
GFR calc non Af Amer: >60 mL/min (ref >60)
Glucose, Bld: 108 mg/dL — ABNORMAL HIGH (ref 70–99)
Potassium: 3 mmol/L — ABNORMAL LOW (ref 3.5–5.1)
Sodium: 134 mmol/L — ABNORMAL LOW (ref 135–145)
Total Bilirubin: 0.6 mg/dL (ref 0.3–1.2)
Total Protein: 7.8 g/dL (ref 6.5–8.1)

## 2020-04-03 LAB — HCG, QUANTITATIVE, PREGNANCY: hCG, Beta Chain, Quant, S: 132126 m[IU]/mL — ABNORMAL HIGH (ref <5)

## 2020-04-03 LAB — LIPASE, BLOOD: Lipase: 33 U/L (ref 11–51)

## 2020-04-03 LAB — POCT PREGNANCY, URINE: Preg Test, Ur: POSITIVE — AB

## 2020-04-03 MED ORDER — LACTATED RINGERS IV BOLUS
1000.0000 mL | Freq: Once | INTRAVENOUS | Status: AC
  Administered 2020-04-03: 1000 mL via INTRAVENOUS

## 2020-04-03 MED ORDER — METOCLOPRAMIDE HCL 5 MG/ML IJ SOLN
10.0000 mg | Freq: Once | INTRAMUSCULAR | Status: AC
  Administered 2020-04-03: 10 mg via INTRAVENOUS
  Filled 2020-04-03: qty 2

## 2020-04-03 MED ORDER — CEPHALEXIN 500 MG PO CAPS: 500.0000 mg | ORAL_CAPSULE | Freq: Four times a day (QID) | ORAL | 0 refills | Status: AC

## 2020-04-03 MED ORDER — POTASSIUM CHLORIDE 20 MEQ PO PACK
40.0000 meq | PACK | Freq: Once | ORAL | Status: AC
  Administered 2020-04-03: 40 meq via ORAL
  Filled 2020-04-03: qty 2

## 2020-04-03 MED ORDER — CEPHALEXIN 500 MG PO CAPS
500.0000 mg | ORAL_CAPSULE | Freq: Once | ORAL | Status: AC
  Administered 2020-04-03: 500 mg via ORAL
  Filled 2020-04-03: qty 1

## 2020-04-03 NOTE — ED Provider Notes (Signed)
[facility] Emergency Department Provider Note ____________________________________________   First MD Initiated Contact with Patient 04/03/20 1403     (approximate)  I have reviewed the triage vital signs and the nursing notes.  HISTORY  Chief Complaint Abdominal Pain   HPI Judy Conner is a 34 y.o. femalewho presents to the ED for evaluation of abdominal pain.  Chart review indicates patient recently tested positive for COVID-19 on 9/12.  Patient reports that she is a G5, P3 at about [redacted] weeks gestation.  History of previous tubal ectopic pregnancy, otherwise 3 gestations going to term.  She reports her LMP was August 6th.  She reports noting a home positive pregnancy test 5 days ago.  Patient reports concern for dehydration due to nausea and nonbloody nonbilious emesis most mornings when she wakes up that improves throughout the day without medication administration.  She reports poor p.o. intake of solids, myalgias diffusely, generalized weakness and nonproductive cough.  She denies chest pain or shortness of breath at rest, though does report feeling "winded" with ambulation.  Patient denies any vaginal bleeding or passage of clots.  No new vaginal discharge.  Denies dysuria, but does report increased frequency to her urination.  She is now reporting 7/10 intensity right pelvic/RLQ pain that has been intermittent for the past couple days, lasting minutes before self resolving.  Reports the pain is "not that bad" right now, and she further reports sensation of nausea at this time.  She denies my offer to provide any analgesia, minimizing her pain.  Past Medical History:  Diagnosis Date  . Anemia    hx low iron   . Chronic kidney disease    hx uti none currently  . Dysmenorrhea, unspecified 12/12/2019  . GERD (gastroesophageal reflux disease)   . Headache(784.0)   . History of umbilical hernia 1/61/0960  . Sciatica     Patient Active Problem List     Diagnosis Date Noted  . Abdominal pain, RLQ 04/01/2020  . Acute cystitis without hematuria 12/12/2019  . Dysmenorrhea, unspecified 12/12/2019  . History of umbilical hernia 45/40/9811  . Low back pain 12/11/2019  . Gastroesophageal reflux disease 12/11/2019    Past Surgical History:  Procedure Laterality Date  . CESAREAN SECTION  [phone]  . FINGER FRACTURE SURGERY     left pinky    Prior to Admission medications   Medication Sig Start Date End Date Taking? Authorizing Provider  cephALEXin (KEFLEX) 500 MG capsule Take 1 capsule (500 mg total) by mouth 4 (four) times daily for 3 days. 04/03/20 04/06/20  Vladimir Crofts, MD  famotidine (PEPCID) 20 MG tablet Take 1 tablet (20 mg total) by mouth 2 (two) times daily. 10/31/16   Carrie Mew, MD    Allergies Fruit & vegetable daily [nutritional supplements] and Fentanyl  Family History  Problem Relation Age of Onset  . Breast cancer Maternal Grandmother   . Diabetes Maternal Grandmother   . Clotting disorder Paternal Uncle   . Heart disease Father   . Hypertension Father   . Kidney disease Father     Social History Social History   Tobacco Use  . Smoking status: Current Some Day Smoker    Packs/day: 0.25    Years: 4.00    Pack years: 1.00    Types: Cigarettes  . Smokeless tobacco: Never Used  Vaping Use  . Vaping Use: Never used  Substance Use Topics  . Alcohol use: No    Comment: none  . Drug use: No  Review of Systems  Constitutional: Positive for subjective fevers and chills. Eyes: No visual changes. ENT: No sore throat. Cardiovascular: Denies chest pain. Respiratory: Denies shortness of breath.  Positive for nonproductive cough. Gastrointestinal: Positive for abdominal pain, nausea and vomiting.  No diarrhea.  No constipation. Genitourinary: Negative for dysuria. Musculoskeletal: Negative for back pain. Skin: Negative for rash. Neurological: Negative for headaches, focal weakness or  numbness.  ____________________________________________   PHYSICAL EXAM:  VITAL SIGNS: Vitals:   04/03/20 1318 04/03/20 1721  BP: 120/65 129/72  Pulse: 60 66  Resp: 18 18  Temp: 98.6 F (37 C) 98 F (36.7 C)  SpO2: 96% 97%      Constitutional: Alert and oriented.  Uncomfortable-appearing.  Conversational in full sentences. Eyes: Conjunctivae are normal. PERRL. EOMI. Head: Atraumatic. Nose: No congestion/rhinnorhea. Mouth/Throat: Mucous membranes are dry.  Oropharynx non-erythematous. Neck: No stridor. No cervical spine tenderness to palpation. Cardiovascular: Normal rate, regular rhythm. Grossly normal heart sounds.  Good peripheral circulation. Respiratory: Normal respiratory effort.  No retractions. Lungs CTAB.  Good air movement throughout and no wheezing. Gastrointestinal: Soft , nondistended. No abdominal bruits. No CVA tenderness. Mild RLQ tenderness without peritoneal features.  Otherwise benign abdomen. Musculoskeletal: No lower extremity tenderness nor edema.  No joint effusions. No signs of acute trauma. Neurologic:  Normal speech and language. No gross focal neurologic deficits are appreciated. No gait instability noted. Skin:  Skin is warm, dry and intact. No rash noted. Psychiatric: Mood and affect are normal. Speech and behavior are normal.  ____________________________________________   LABS (all labs ordered are listed, but only abnormal results are displayed)  Labs Reviewed  COMPREHENSIVE METABOLIC PANEL - Abnormal; Notable for the following components:      Result Value   Sodium 134 (*)    Potassium 3.0 (*)    Glucose, Bld 108 (*)    All other components within normal limits  CBC - Abnormal; Notable for the following components:   WBC 12.8 (*)    All other components within normal limits  URINALYSIS, COMPLETE (UACMP) WITH MICROSCOPIC - Abnormal; Notable for the following components:   Color, Urine AMBER (*)    APPearance HAZY (*)    Specific  Gravity, Urine 1.031 (*)    Hgb urine dipstick SMALL (*)    Ketones, ur 20 (*)    Protein, ur 100 (*)    Leukocytes,Ua MODERATE (*)    All other components within normal limits  HCG, QUANTITATIVE, PREGNANCY - Abnormal; Notable for the following components:   hCG, Beta Chain, Quant, S 132,126 (*)    All other components within normal limits  POCT PREGNANCY, URINE - Abnormal; Notable for the following components:   Preg Test, Ur POSITIVE (*)    All other components within normal limits  URINE CULTURE  LIPASE, BLOOD   ____________________________________________  RADIOLOGY  ED MD interpretation:    Official radiology report(s): US OB Comp AddL Gest Less 14 Wks  Result Date: 04/03/2020 CLINICAL DATA:  Pelvic pain 1 day EXAM: TWIN OBSTETRICAL ULTRASOUND <14 WKS COMPARISON:  None. FINDINGS: Number of IUPs:  2 Chorionicity/Amnionicity:  Dichorionic-diamniotic (thick membrane) TWIN 1 Yolk sac:  Visualized. Embryo:  Visualized. Cardiac Activity: Visualized. Heart Rate: 169 bpm CRL:   7.4 mm   6 w 5 d                  [place] EDC: 11/22/2020 TWIN 2 Yolk sac:  Visualized. Embryo:  Visualized. Cardiac Activity: Visualized. Heart Rate: 145 bpm CRL:  6 mm   6 w 3 d                  [place] EDC: 11/22/2020 Subchorionic hemorrhage:  None visualized. Maternal uterus/adnexae: Gravid, anteverted maternal uterus. No concerning adnexal lesions. Likely corpus luteum in the right ovary measuring up to 1.9 cm. Trace anechoic free fluid is seen within the pelvis. IMPRESSION: Dichorionic/diamniotic twin gestation with visible cardiac activity and concordant gestational ages at 80 weeks, 5 days and 6 weeks, 3 days by crown-rump length estimation. No acute sonographic complication. Trace anechoic free fluid in the pelvis, possibly physiologic. Electronically Signed   By: Lovena Le M.D.   On: 04/03/2020 16:25   US OB LESS THAN 14 WEEKS WITH OB TRANSVAGINAL  Result Date: 04/03/2020 CLINICAL DATA:  Pelvic pain 1 day EXAM:  TWIN OBSTETRICAL ULTRASOUND <14 WKS COMPARISON:  None. FINDINGS: Number of IUPs:  2 Chorionicity/Amnionicity:  Dichorionic-diamniotic (thick membrane) TWIN 1 Yolk sac:  Visualized. Embryo:  Visualized. Cardiac Activity: Visualized. Heart Rate: 169 bpm CRL:   7.4 mm   6 w 5 d                  [place] EDC: 11/22/2020 TWIN 2 Yolk sac:  Visualized. Embryo:  Visualized. Cardiac Activity: Visualized. Heart Rate: 145 bpm CRL:   6 mm   6 w 3 d                  [place] EDC: 11/22/2020 Subchorionic hemorrhage:  None visualized. Maternal uterus/adnexae: Gravid, anteverted maternal uterus. No concerning adnexal lesions. Likely corpus luteum in the right ovary measuring up to 1.9 cm. Trace anechoic free fluid is seen within the pelvis. IMPRESSION: Dichorionic/diamniotic twin gestation with visible cardiac activity and concordant gestational ages at 31 weeks, 5 days and 6 weeks, 3 days by crown-rump length estimation. No acute sonographic complication. Trace anechoic free fluid in the pelvis, possibly physiologic. Electronically Signed   By: Lovena Le M.D.   On: 04/03/2020 16:25    ____________________________________________   PROCEDURES and INTERVENTIONS  Procedure(s) performed (including Critical Care):  Procedures  Medications  lactated ringers bolus 1,000 mL (0 mLs Intravenous Stopped 04/03/20 1708)  lactated ringers bolus 1,000 mL (0 mLs Intravenous Stopped 04/03/20 1708)  metoCLOPramide (REGLAN) injection 10 mg (10 mg Intravenous Given 04/03/20 1451)  potassium chloride (KLOR-CON) packet 40 mEq (40 mEq Oral Given 04/03/20 1718)  cephALEXin (KEFLEX) capsule 500 mg (500 mg Oral Given 04/03/20 1718)    ____________________________________________   MDM / ED COURSE  34 year old G5 at about [redacted] weeks gestation, also Covid positive, presents with abdominal pain most attributable to early UTI versus appropriate cramping in the setting of twin IUP, and amenable to outpatient management with OB/GYN follow-up and  antibiotics to treat UTI.  Normal vital signs on room air.  Reassuring exam without peritoneal abdomen or distress.  She has some minimal deep RLQ tenderness/right pelvic pain, but otherwise benign abdomen.  Blood work with mild hypokalemia, was repleted orally, otherwise with minimal leukocytosis to 12.  Urine shows some infectious features with up to 20 white cells, despite negative nitrites.  With her symptoms of increased frequency in the setting of pregnancy and this UA, I think is reasonable to treat for acute cystitis so patient was started on a short course of Keflex.  Transvaginal ultrasound demonstrates dichorionic twin gestations without evidence of acute pathology or ectopic pregnancy.  Patient symptoms are well controlled and she feels much better after 2 L IVF and  Reglan.  Provided patient additional information for OB/GYN follow-up and we discussed outpatient treatment of her early gestation and COVID-19 infection.  We discussed return precautions for the ED.  Patient is medically stable for discharge home.  Clinical Course as of Apr 03 1736  Thu Apr 03, 2020  1640 Educated patient on ultrasound results with twin gestation.  We discussed outpatient management of Covid and early gestation.  We discussed return precautions for the ED and following up with OB/GYN.   [DS]  1642 Patient reports feeling much better and is thankful for care   [DS]    Clinical Course User Index [DS] Vladimir Crofts, MD    ____________________________________________   FINAL CLINICAL IMPRESSION(S) / ED DIAGNOSES  Final diagnoses:  Dichorionic diamniotic twin pregnancy in first trimester  Less than [redacted] weeks gestation of pregnancy  Hypokalemia  COVID-19  Cystitis during pregnancy in first trimester, antepartum     ED Discharge Orders         Ordered    cephALEXin (KEFLEX) 500 MG capsule  4 times daily        04/03/20 1648           Kolsen Choe   Note:  This document was prepared using Dragon  voice recognition software and may include unintentional dictation errors.   Vladimir Crofts, MD 04/03/20 [phone]

## 2020-04-03 NOTE — ED Triage Notes (Signed)
Pt I via EMS from home with c/o abd pain and nausea. Pt also tsted + for COVID this weekend and found out she was pregnant this weekend.

## 2020-04-03 NOTE — Discharge Instructions (Addendum)
You were seen in the ED because of your abdominal pain in the setting of pregnancy and Covid.  You have evidence of a twin gestation.  Both appear healthy at this time.   Please follow-up with OB/GYN as soon as possible.  Have attached the phone number for one of our local OB/GYN's is on-call today and available to you.  You can use them or your previous OB/GYN, please call him to be seen in the clinic in the next week or so.  At home, use Tylenol for your fevers and discomfort.  This will help control your symptoms of COVID-19 this he feels well enough to stay hydrated and get rest. Use Tylenol for pain and fevers.  Up to 1000 mg per dose, up to 4 times per day.  Do not take more than 4000 mg of Tylenol/acetaminophen within 24 hours..  You are being discharged with a prescription for Keflex antibiotic to treat an early UTI on urine testing.  It is important to treat UTIs earlier and more aggressively in pregnancy so the baby does not become at risk.  Please take 4 pills/day over the next 3 days and finish all 12 pills.  If you develop any vaginal bleeding, worsening chest pain or abdominal pain, fevers not controlled with Tylenol, please return to the ED.

## 2020-04-03 NOTE — ED Triage Notes (Addendum)
  covid positive test 03/30/20. Reports being sick the week before getting tested. Positive pregnancy test Saturday 03/29/20. Last menstruall cycle around August 6th. Here today for sharp right sided abdominal pain

## 2020-04-05 LAB — URINE CULTURE

## 2020-04-08 ENCOUNTER — Telehealth: Payer: Self-pay | Admitting: Nurse Practitioner

## 2020-04-08 NOTE — Telephone Encounter (Signed)
Tried to call; phone rang and rang then call ended. Will try again another time.

## 2020-04-08 NOTE — Telephone Encounter (Signed)
She has tested positive for Covid. She was seen in the emergency department last Thursday for abdominal pain diagnosed with early twin pregnancy and urinary tract infection and Covid.    Jessica: Please call her for a symptom update and set up a video visit to follow.  Has she made appointment with OB/gynecologist yet.

## 2020-04-09 NOTE — Telephone Encounter (Signed)
Tried to call; phone rang and rang then call ended. Will try again another time.

## 2020-04-10 NOTE — Telephone Encounter (Signed)
Spoke with patient; states she was doing okay and was asleep then hung up on me.

## 2020-04-11 ENCOUNTER — Emergency Department: Payer: BC Managed Care – PPO

## 2020-04-11 ENCOUNTER — Other Ambulatory Visit: Payer: Self-pay

## 2020-04-11 ENCOUNTER — Emergency Department
Admission: EM | Admit: 2020-04-11 | Discharge: 2020-04-11 | Disposition: A | Discharge status: Home / Self Care | Payer: BC Managed Care – PPO | Attending: Emergency Medicine | Admitting: Emergency Medicine

## 2020-04-11 DIAGNOSIS — O26891 Other specified pregnancy related conditions, first trimester: Secondary | ICD-10-CM | POA: Diagnosis not present

## 2020-04-11 DIAGNOSIS — D72829 Elevated white blood cell count, unspecified: Secondary | ICD-10-CM | POA: Diagnosis not present

## 2020-04-11 DIAGNOSIS — O30041 Twin pregnancy, dichorionic/diamniotic, first trimester: Secondary | ICD-10-CM | POA: Diagnosis not present

## 2020-04-11 DIAGNOSIS — O209 Hemorrhage in early pregnancy, unspecified: Secondary | ICD-10-CM | POA: Diagnosis not present

## 2020-04-11 DIAGNOSIS — N939 Abnormal uterine and vaginal bleeding, unspecified: Secondary | ICD-10-CM | POA: Diagnosis not present

## 2020-04-11 DIAGNOSIS — Z3A14 14 weeks gestation of pregnancy: Secondary | ICD-10-CM | POA: Diagnosis not present

## 2020-04-11 DIAGNOSIS — N189 Chronic kidney disease, unspecified: Secondary | ICD-10-CM | POA: Insufficient documentation

## 2020-04-11 DIAGNOSIS — Z3A08 8 weeks gestation of pregnancy: Secondary | ICD-10-CM | POA: Insufficient documentation

## 2020-04-11 DIAGNOSIS — F17210 Nicotine dependence, cigarettes, uncomplicated: Secondary | ICD-10-CM | POA: Diagnosis not present

## 2020-04-11 LAB — COMPREHENSIVE METABOLIC PANEL
ALT: 51 U/L — ABNORMAL HIGH (ref 0–44)
AST: 28 U/L (ref 15–41)
Albumin: 3.3 g/dL — ABNORMAL LOW (ref 3.5–5.0)
Alkaline Phosphatase: 41 U/L (ref 38–126)
Anion gap: 9 (ref 5–15)
BUN: 5 mg/dL — ABNORMAL LOW (ref 6–20)
CO2: 23 mmol/L (ref 22–32)
Calcium: 8.6 mg/dL — ABNORMAL LOW (ref 8.9–10.3)
Chloride: 105 mmol/L (ref 98–111)
Creatinine, Ser: 0.55 mg/dL (ref 0.44–1.00)
GFR calc Af Amer: >60 mL/min (ref >60)
GFR calc non Af Amer: >60 mL/min (ref >60)
Glucose, Bld: 98 mg/dL (ref 70–99)
Potassium: 3 mmol/L — ABNORMAL LOW (ref 3.5–5.1)
Sodium: 137 mmol/L (ref 135–145)
Total Bilirubin: 0.4 mg/dL (ref 0.3–1.2)
Total Protein: 6.4 g/dL — ABNORMAL LOW (ref 6.5–8.1)

## 2020-04-11 LAB — CBC
HCT: 34.8 % — ABNORMAL LOW (ref 36.0–46.0)
Hemoglobin: 11.9 g/dL — ABNORMAL LOW (ref 12.0–15.0)
MCH: 30.5 pg (ref 26.0–34.0)
MCHC: 34.2 g/dL (ref 30.0–36.0)
MCV: 89.2 fL (ref 80.0–100.0)
Platelets: 260 10*3/uL (ref 150–400)
RBC: 3.9 MIL/uL (ref 3.87–5.11)
RDW: 16.4 % — ABNORMAL HIGH (ref 11.5–15.5)
WBC: 13 10*3/uL — ABNORMAL HIGH (ref 4.0–10.5)
nRBC: 0 % (ref 0.0–0.2)

## 2020-04-11 LAB — URINALYSIS, COMPLETE (UACMP) WITH MICROSCOPIC
Bilirubin Urine: NEGATIVE
Glucose, UA: NEGATIVE mg/dL
Ketones, ur: NEGATIVE mg/dL
Nitrite: NEGATIVE
Protein, ur: NEGATIVE mg/dL
Specific Gravity, Urine: 1.015 (ref 1.005–1.030)
pH: 8 (ref 5.0–8.0)

## 2020-04-11 LAB — LIPASE, BLOOD: Lipase: 30 U/L (ref 11–51)

## 2020-04-11 LAB — POCT PREGNANCY, URINE: Preg Test, Ur: POSITIVE — AB

## 2020-04-11 LAB — HCG, QUANTITATIVE, PREGNANCY: hCG, Beta Chain, Quant, S: 244216 m[IU]/mL — ABNORMAL HIGH (ref <5)

## 2020-04-11 MED ORDER — CEPHALEXIN 500 MG PO CAPS: 500.0000 mg | ORAL_CAPSULE | Freq: Three times a day (TID) | ORAL | 0 refills | Status: AC

## 2020-04-11 NOTE — ED Provider Notes (Signed)
Emergency Department Provider Note  ____________________________________________  Time seen: Approximately 3:59 PM  I have reviewed the triage vital signs and the nursing notes.   HISTORY  Chief Complaint Vaginal Bleeding   Historian Patient     HPI Cortasia Screws is a 34 y.o. female G4, P3, presents to the emergency department with vaginal bleeding.  Patient states that she is approximately [redacted] weeks pregnant and started having vaginal bleeding today.  Patient states that she is unable to quantify her vaginal bleeding stating "I do not know, I cannot articulate that math."  She states that she has having pelvic cramping and low back pain.  She denies complications with her other pregnancies.  She denies dysuria and increased urinary frequency.  She had some nausea today but no vomiting.   Past Medical History:  Diagnosis Date  . Anemia    hx low iron   . Chronic kidney disease    hx uti none currently  . Dysmenorrhea, unspecified 12/12/2019  . GERD (gastroesophageal reflux disease)   . Headache(784.0)   . History of umbilical hernia 0/09/7046  . Sciatica      Immunizations up to date:  Yes.     Past Medical History:  Diagnosis Date  . Anemia    hx low iron   . Chronic kidney disease    hx uti none currently  . Dysmenorrhea, unspecified 12/12/2019  . GERD (gastroesophageal reflux disease)   . Headache(784.0)   . History of umbilical hernia 8/89/1694  . Sciatica     Patient Active Problem List   Diagnosis Date Noted  . Abdominal pain, RLQ 04/01/2020  . Acute cystitis without hematuria 12/12/2019  . Dysmenorrhea, unspecified 12/12/2019  . History of umbilical hernia 50/38/8828  . Low back pain 12/11/2019  . Gastroesophageal reflux disease 12/11/2019    Past Surgical History:  Procedure Laterality Date  . CESAREAN SECTION  [phone]  . FINGER FRACTURE SURGERY     left pinky    Prior to Admission medications   Medication Sig Start Date End  Date Taking? Authorizing Provider  famotidine (PEPCID) 20 MG tablet Take 1 tablet (20 mg total) by mouth 2 (two) times daily. 10/31/16   Carrie Mew, MD    Allergies Fruit & vegetable daily [nutritional supplements] and Fentanyl  Family History  Problem Relation Age of Onset  . Breast cancer Maternal Grandmother   . Diabetes Maternal Grandmother   . Clotting disorder Paternal Uncle   . Heart disease Father   . Hypertension Father   . Kidney disease Father     Social History Social History   Tobacco Use  . Smoking status: Current Some Day Smoker    Packs/day: 0.25    Years: 4.00    Pack years: 1.00    Types: Cigarettes  . Smokeless tobacco: Never Used  Vaping Use  . Vaping Use: Never used  Substance Use Topics  . Alcohol use: No    Comment: none  . Drug use: No     Review of Systems  Constitutional: No fever/chills Eyes:  No discharge ENT: No upper respiratory complaints. Respiratory: no cough. No SOB/ use of accessory muscles to breath Gastrointestinal:   No nausea, no vomiting.  No diarrhea.  No constipation. Genitourinary: Patient has vaginal bleeding.  Musculoskeletal: Negative for musculoskeletal pain. Skin: Negative for rash, abrasions, lacerations, ecchymosis.   ____________________________________________   PHYSICAL EXAM:  VITAL SIGNS: ED Triage Vitals  Enc Vitals Group     BP 04/11/20 1443 112/63  Pulse Rate 04/11/20 1443 89     Resp 04/11/20 1443 16     Temp 04/11/20 1553 98 F (36.7 C)     Temp Source 04/11/20 1553 Oral     SpO2 04/11/20 1443 100 %     Weight 04/11/20 1243 215 lb (97.5 kg)     Height 04/11/20 1243 5\' 4"  (1.626 m)     Head Circumference --      Peak Flow --      Pain Score 04/11/20 1243 3     Pain Loc --      Pain Edu? --      Excl. in [place]? --      Constitutional: Alert and oriented. Well appearing and in no acute distress. Eyes: Conjunctivae are normal. PERRL. EOMI. Head: Atraumatic. ENT:      Nose: No  congestion/rhinnorhea.      Mouth/Throat: Mucous membranes are moist.  Neck: No stridor.  No cervical spine tenderness to palpation. Cardiovascular: Normal rate, regular rhythm. Normal S1 and S2.  Good peripheral circulation. Respiratory: Normal respiratory effort without tachypnea or retractions. Lungs CTAB. Good air entry to the bases with no decreased or absent breath sounds Gastrointestinal: Bowel sounds x 4 quadrants. Soft and nontender to palpation. No guarding or rigidity. No distention. Musculoskeletal: Full range of motion to all extremities. No obvious deformities noted Neurologic:  Normal for age. No gross focal neurologic deficits are appreciated.  Skin:  Skin is warm, dry and intact. No rash noted. Psychiatric: Mood and affect are normal for age. Speech and behavior are normal.   ____________________________________________   LABS (all labs ordered are listed, but only abnormal results are displayed)  Labs Reviewed  CBC - Abnormal; Notable for the following components:      Result Value   WBC 13.0 (*)    Hemoglobin 11.9 (*)    HCT 34.8 (*)    RDW 16.4 (*)    All other components within normal limits  HCG, QUANTITATIVE, PREGNANCY - Abnormal; Notable for the following components:   hCG, Beta Chain, Quant, S 244,216 (*)    All other components within normal limits  URINALYSIS, COMPLETE (UACMP) WITH MICROSCOPIC - Abnormal; Notable for the following components:   Color, Urine YELLOW (*)    APPearance HAZY (*)    Hgb urine dipstick LARGE (*)    Leukocytes,Ua TRACE (*)    Bacteria, UA RARE (*)    All other components within normal limits  COMPREHENSIVE METABOLIC PANEL - Abnormal; Notable for the following components:   Potassium 3.0 (*)    BUN 5 (*)    Calcium 8.6 (*)    Total Protein 6.4 (*)    Albumin 3.3 (*)    ALT 51 (*)    All other components within normal limits  POCT PREGNANCY, URINE - Abnormal; Notable for the following components:   Preg Test, Ur POSITIVE  (*)    All other components within normal limits  LIPASE, BLOOD   ____________________________________________  EKG   ____________________________________________  RADIOLOGY Unk Pinto, personally viewed and evaluated these images (plain radiographs) as part of my medical decision making, as well as reviewing the written report by the radiologist.    US OB Comp Less 14 Wks  Result Date: 04/11/2020 CLINICAL DATA:  Vaginal bleeding, quantitative beta HCG 244,216 EXAM: TWIN OBSTETRICAL ULTRASOUND <14 WKS COMPARISON:  04/03/2020 FINDINGS: Number of IUPs:  2 Chorionicity/Amnionicity:  Dichorionic-diamniotic (thick membrane) TWIN 1 Yolk sac:  Visualized. Embryo:  Visualized. Cardiac  Activity: Visualized. Heart Rate: 160 bpm CRL: 18.8 mm   8 w 3 d                  [place] EDC: 11/18/2020 TWIN 2 Yolk sac:  Visualized. Embryo:  Visualized. Cardiac Activity: Visualized. Heart Rate: 162 bpm CRL: 19.7 mm   8 w 4 d                  [place] EDC: 11/17/2020 Subchorionic hemorrhage:  None visualized. Maternal uterus/adnexae: Left ovary measures 2.5 x 3.3 x 2.6 cm and the right ovary measures 3.1 x 3.3 x 1.9 cm. Trace free fluid within the right adnexa. IMPRESSION: 1. Dichorionic diamniotic live twin gestation as above, estimated age 36 weeks and 3 days for fetus A and 8 weeks and 4 days for fetus B. No evidence of complication. Electronically Signed   By: Randa Ngo M.D.   On: 04/11/2020 17:59   US OB Comp AddL Gest Less 14 Wks  Result Date: 04/11/2020 CLINICAL DATA:  Vaginal bleeding, quantitative beta HCG 244,216 EXAM: TWIN OBSTETRICAL ULTRASOUND <14 WKS COMPARISON:  04/03/2020 FINDINGS: Number of IUPs:  2 Chorionicity/Amnionicity:  Dichorionic-diamniotic (thick membrane) TWIN 1 Yolk sac:  Visualized. Embryo:  Visualized. Cardiac Activity: Visualized. Heart Rate: 160 bpm CRL: 18.8 mm   8 w 3 d                  [place] EDC: 11/18/2020 TWIN 2 Yolk sac:  Visualized. Embryo:  Visualized. Cardiac Activity: Visualized.  Heart Rate: 162 bpm CRL: 19.7 mm   8 w 4 d                  [place] EDC: 11/17/2020 Subchorionic hemorrhage:  None visualized. Maternal uterus/adnexae: Left ovary measures 2.5 x 3.3 x 2.6 cm and the right ovary measures 3.1 x 3.3 x 1.9 cm. Trace free fluid within the right adnexa. IMPRESSION: 1. Dichorionic diamniotic live twin gestation as above, estimated age 12 weeks and 3 days for fetus A and 8 weeks and 4 days for fetus B. No evidence of complication. Electronically Signed   By: Randa Ngo M.D.   On: 04/11/2020 17:59    ____________________________________________    PROCEDURES  Procedure(s) performed:     Procedures     Medications - No data to display   ____________________________________________   INITIAL IMPRESSION / ASSESSMENT AND PLAN / ED COURSE  Pertinent labs & imaging results that were available during my care of the patient were reviewed by me and considered in my medical decision making (see chart for details).      Assessment and Plan:  Vaginal Bleeding in first trimester.  34 year old female presents to the emergency department with vaginal bleeding that started today.  Vital signs were reassuring at triage.  On physical exam, patient was resting comfortably.  Abdomen was soft and nontender without guarding.  CBC was reviewed.  Patient had mild leukocytosis with a reassuring H&H.  Lipase was within reference range.  Urinalysis revealed a large amount of blood and rare bacteria.  Beta hCG was elevated.  OB ultrasound revealed dichorionic diamniotic live twin gestation without evidence of subchorionic hematoma.  Patient is hemodynamically stable at this time.  Recommended following up with Dr. Marcelline Mates as soon as possible  Tylenol was recommended for discomfort.  All patient questions were answered.    ____________________________________________  FINAL CLINICAL IMPRESSION(S) / ED DIAGNOSES  Final diagnoses:  Vaginal bleeding in pregnancy, first  trimester  NEW MEDICATIONS STARTED DURING THIS VISIT:  ED Discharge Orders    None          This chart was dictated using voice recognition software/Dragon. Despite best efforts to proofread, errors can occur which can change the meaning. Any change was purely unintentional.     Lannie Fields, PA-C 04/11/20 Cicero Duck, MD 04/18/20 [phone]

## 2020-04-11 NOTE — ED Notes (Signed)
Pt states I'm pregnant and I'm bleeding. Asked pt how many pads/hr, pt states "I don't know, I can't articulate that math, I'm pregnant and I'm bleeding and I've been waiting too long already." Pt throwing dirty pad on bed and states "Here assess this."

## 2020-04-11 NOTE — ED Notes (Signed)
Patient declined discharge vitals.

## 2020-04-11 NOTE — Discharge Instructions (Addendum)
Please make follow-up appointment with your OB/GYN soon as possible. Take Keflex three times daily for the next week.

## 2020-04-11 NOTE — ED Triage Notes (Signed)
Pt comes via POV from home with c/o vaginal bleeding. Pt states she is under [redacted] weeks along and started bleeding this am. Pt states some cramping and going through a pad.  Pt states she has not had OBGYN appt yet due to recently getting out of quarantine.

## 2020-04-11 NOTE — ED Triage Notes (Signed)
First Rn note: pt pstates approx [redacted] weeks pregnant with twins, states started having vaginal bleeding. A&O X4. NAD noted at this time.

## 2020-04-11 NOTE — Telephone Encounter (Signed)
Patient is feeling better as far as COVID sx but she is currently at the hospital. She woke up this am bleeding and the bleeding has not stopped so she is at the hospital waiting to be seen.

## 2020-06-20 ENCOUNTER — Telehealth (INDEPENDENT_AMBULATORY_CARE_PROVIDER_SITE_OTHER): Payer: BC Managed Care – PPO | Admitting: Internal Medicine

## 2020-06-20 ENCOUNTER — Encounter: Payer: Self-pay | Admitting: Internal Medicine

## 2020-06-20 ENCOUNTER — Other Ambulatory Visit: Payer: Self-pay

## 2020-06-20 DIAGNOSIS — Z91018 Allergy to other foods: Secondary | ICD-10-CM

## 2020-06-20 DIAGNOSIS — B373 Candidiasis of vulva and vagina: Secondary | ICD-10-CM

## 2020-06-20 DIAGNOSIS — D72829 Elevated white blood cell count, unspecified: Secondary | ICD-10-CM

## 2020-06-20 DIAGNOSIS — K219 Gastro-esophageal reflux disease without esophagitis: Secondary | ICD-10-CM | POA: Diagnosis not present

## 2020-06-20 DIAGNOSIS — E538 Deficiency of other specified B group vitamins: Secondary | ICD-10-CM

## 2020-06-20 DIAGNOSIS — B3731 Acute candidiasis of vulva and vagina: Secondary | ICD-10-CM

## 2020-06-20 DIAGNOSIS — R112 Nausea with vomiting, unspecified: Secondary | ICD-10-CM

## 2020-06-20 DIAGNOSIS — E559 Vitamin D deficiency, unspecified: Secondary | ICD-10-CM

## 2020-06-20 DIAGNOSIS — E876 Hypokalemia: Secondary | ICD-10-CM | POA: Diagnosis not present

## 2020-06-20 DIAGNOSIS — B370 Candidal stomatitis: Secondary | ICD-10-CM | POA: Diagnosis not present

## 2020-06-20 MED ORDER — FLUCONAZOLE 150 MG PO TABS: 150.0000 mg | ORAL_TABLET | Freq: Once | ORAL | 0 refills | Status: DC

## 2020-06-20 MED ORDER — POTASSIUM CHLORIDE CRYS ER 20 MEQ PO TBCR: 20.0000 meq | EXTENDED_RELEASE_TABLET | Freq: Two times a day (BID) | ORAL | 1 refills | Status: DC

## 2020-06-20 MED ORDER — NYSTATIN 100000 UNIT/ML MT SUSP: 5.0000 mL | Freq: Four times a day (QID) | OROMUCOSAL | 1 refills | Status: DC

## 2020-06-20 MED ORDER — PROMETHAZINE HCL 12.5 MG PO TABS: 12.5000 mg | ORAL_TABLET | Freq: Two times a day (BID) | ORAL | 0 refills | Status: DC | PRN

## 2020-06-20 NOTE — Progress Notes (Signed)
Virtual Visit via Video Note  I connected with Judy Conner  on 06/20/20 at 11:30 AM EST by a video enabled telemedicine application and verified that I am speaking with the correct person using two identifiers.  Location patient: home, [place] Location provider:work or home office Persons participating in the virtual visit: patient, provider  I discussed the limitations of evaluation and management by telemedicine and the availability of in person appointments. The patient expressed understanding and agreed to proceed.   HPI: 1. Worsening GERD belching, n/v x 1 week and h/o recurrent n/v and low K tried zofran at work Friday and did not provide relief. She is no longer pregnant per [place] 04/11/20 trying pepcid otc has tried protonix and omeprazole in the past ww/o help  Thrush on tongue now but tongue is not sore but is white is white x 1 week. She has tried  Brushing tongue and gargling   She has been on Abx w/in the past few months  She reports food allergies I.e with eating bananas her mouth itches and lips swell wants to be referred to allergy for testing   Ok to get 2nd pfizer shot today   ROS: See pertinent positives and negatives per HPI.  Past Medical History:  Diagnosis Date  . Anemia    hx low iron   . Chronic kidney disease    hx uti none currently  . Dysmenorrhea, unspecified 12/12/2019  . GERD (gastroesophageal reflux disease)   . Headache(784.0)   . History of umbilical hernia 2/95/1884  . Sciatica     Past Surgical History:  Procedure Laterality Date  . CESAREAN SECTION  [phone]  . FINGER FRACTURE SURGERY     left pinky     Current Outpatient Medications:  .  famotidine (PEPCID) 20 MG tablet, Take 1 tablet (20 mg total) by mouth 2 (two) times daily., Disp: 60 tablet, Rfl: 0 .  fluconazole (DIFLUCAN) 150 MG tablet, Take 1 tablet (150 mg total) by mouth once for 1 dose. Repeat dose 2 next week if needed, Disp: 2 tablet, Rfl: 0 .  nystatin (MYCOSTATIN)  100000 UNIT/ML suspension, Take 5 mLs (500,000 Units total) by mouth 4 (four) times daily. Gargle and hold x 2-5 minutes and spit x 7-14 days, Disp: 473 mL, Rfl: 1 .  potassium chloride SA (KLOR-CON) 20 MEQ tablet, Take 1 tablet (20 mEq total) by mouth 2 (two) times daily., Disp: 60 tablet, Rfl: 1 .  promethazine (PHENERGAN) 12.5 MG tablet, Take 1-2 tablets (12.5-25 mg total) by mouth 2 (two) times daily as needed for nausea or vomiting., Disp: 60 tablet, Rfl: 0  EXAM:  VITALS per patient if applicable:  GENERAL: alert, oriented, appears well and in no acute distress  HEENT: atraumatic, conjunttiva clear, no obvious abnormalities on inspection of external nose and ears White change on tongue today   NECK: normal movements of the head and neck  LUNGS: on inspection no signs of respiratory distress, breathing rate appears normal, no obvious gross SOB, gasping or wheezing  CV: no obvious cyanosis  MS: moves all visible extremities without noticeable abnormality  PSYCH/NEURO: pleasant and cooperative, no obvious depression or anxiety, speech and thought processing grossly intact  ASSESSMENT AND PLAN:  Discussed the following assessment and plan:  Thrush - Plan: nystatin (MYCOSTATIN) 100000 UNIT/ML suspension, Ambulatory referral to Allergy, fluconazole (DIFLUCAN) 150 MG tablet  Food allergy - Plan: Ambulatory referral to Gastroenterology, Ambulatory referral to Allergy  Gastroesophageal reflux disease without esophagitis - Plan: Ambulatory referral to  Allergy  Hypokalemia - Plan: potassium chloride SA (KLOR-CON) 20 MEQ tablet, Ambulatory referral to Allergy, Basic Metabolic Panel (BMET), Magnesium  Intractable vomiting with nausea, unspecified vomiting type - Plan: promethazine (PHENERGAN) 12.5 MG tablet, Ambulatory referral to Allergy  Yeast vaginitis - Plan: fluconazole (DIFLUCAN) 150 MG tablet  Leukocytosis, unspecified type - Plan: CBC with Differential/Platelet  B12  deficiency - Plan: Vitamin B12  Vitamin D deficiency - Plan: VITAMIN D 25 Hydroxy (Vit-D Deficiency, Fractures)  Will get pfizer 1/2 will get 2/2 today  Will f/u ob/gyn Dr. [facility] c/o nausea on 1st day of menses will call for appt   -we discussed possible serious and likely etiologies, options for evaluation and workup, limitations of telemedicine visit vs in person visit, treatment, treatment risks and precautions.   I discussed the assessment and treatment plan with the patient. The patient was provided an opportunity to ask questions and all were answered. The patient agreed with the plan and demonstrated an understanding of the instructions.    Time spent 30 minutes Delorise Jackson, MD

## 2020-06-20 NOTE — Progress Notes (Signed)
Called patient to get her ready for the virtual visit.  Patient declines giving me any information on her symptoms or when they started, and declined to update the information in her chart.  Informed Patient that I would send her the link at 11:30.

## 2020-06-20 NOTE — Patient Instructions (Signed)
Oral Thrush, Adult  Oral thrush, also called oral candidiasis, is a fungal infection that develops in the mouth and throat and on the tongue. It causes white patches to form on the mouth and tongue. Thrush is most common in older adults, but it can occur at any age. Many cases of thrush are mild, but this infection can also be serious. Thrush can be a repeated (recurrent) problem for certain people who have a weak body defense system (immune system). The weakness can be caused by chronic illnesses, or by taking medicines that limit the body's ability to fight infection. If a person has difficulty fighting infection, the fungus that causes thrush can spread through the body. This can cause life-threatening blood or organ infections. What are the causes? This condition is caused by a fungus (yeast) called Candida albicans.  This fungus is normally present in small amounts in the mouth and on other mucous membranes. It usually causes no harm.  If conditions are present that allow the fungus to grow without control, it invades surrounding tissues and becomes an infection.  Other Candida species can also lead to thrush (rare). What increases the risk? This condition is more likely to develop in:  People with a weakened immune system.  Older adults.  People with HIV (human immunodeficiency virus).  People with diabetes.  People with dry mouth (xerostomia).  Pregnant women.  People with poor dental care, especially people who have false teeth.  People who use antibiotic medicines. What are the signs or symptoms? Symptoms of this condition can vary from mild and moderate to severe and persistent. Symptoms may include:  A burning feeling in the mouth and throat. This can occur at the start of a thrush infection.  White patches that stick to the mouth and tongue. The tissue around the patches may be red, raw, and painful. If rubbed (during tooth brushing, for example), the patches and the  tissue of the mouth may bleed easily.  A bad taste in the mouth or difficulty tasting foods.  A cottony feeling in the mouth.  Pain during eating and swallowing.  Poor appetite.  Cracking at the corners of the mouth. How is this diagnosed? This condition is diagnosed based on:  Physical exam. Your health care provider will look in your mouth.  Health history. Your health care provider will ask you questions about your health. How is this treated? This condition is treated with medicines called antifungals, which prevent the growth of fungi. These medicines are either applied directly to the affected area (topical) or swallowed (oral). The treatment will depend on the severity of the condition. Mild thrush Mild cases of thrush may clear up with the use of an antifungal mouth rinse or lozenges. Treatment usually lasts about 14 days. Moderate to severe thrush  More severe thrush infections that have spread to the esophagus are treated with an oral antifungal medicine. A topical antifungal medicine may also be used.  For some severe infections, treatment may need to continue for more than 14 days.  Oral antifungal medicines are rarely used during pregnancy because they may be harmful to the unborn child. If you are pregnant, talk with your health care provider about options for treatment. Persistent or recurrent thrush For cases of thrush that do not go away or keep coming back:  Treatment may be needed twice as long as the symptoms last.  Treatment will include both oral and topical antifungal medicines.  People with a weakened immune system can take   an antifungal medicine on a continuous basis to prevent thrush infections. It is important to treat conditions that make a person more likely to get thrush, such as diabetes or HIV. Follow these instructions at home: Medicines  Take over-the-counter and prescription medicines only as told by your health care provider.  Talk with  your health care provider about an over-the-counter medicine called gentian violet, which kills bacteria and fungi. Relieving soreness and discomfort To help reduce the discomfort of thrush:  Drink cold liquids such as water or iced tea.  Try flavored ice treats or frozen juices.  Eat foods that are easy to swallow, such as gelatin, ice cream, or custard.  Try drinking from a straw if the patches in your mouth are painful.  General instructions  Eat plain, unflavored yogurt as directed by your health care provider. Check the label to make sure the yogurt contains live cultures. This yogurt can help healthy bacteria to grow in the mouth and can stop the growth of the fungus that causes thrush.  If you wear dentures, remove the dentures before going to bed, brush them vigorously, and soak them in a cleaning solution as directed by your health care provider.  Rinse your mouth with a warm salt-water mixture several times a day. To make a salt-water mixture, completely dissolve 1/2-1 tsp of salt in 1 cup of warm water. Contact a health care provider if:  Your symptoms are getting worse or are not improving within 7 days of starting treatment.  You have symptoms of a spreading infection, such as white patches on the skin outside of the mouth. This information is not intended to replace advice given to you by your health care provider. Make sure you discuss any questions you have with your health care provider. Document Revised: 10/07/2017 Document Reviewed: 03/29/2016 Elsevier Patient Education  [place].  Hypokalemia Hypokalemia means that the amount of potassium in the blood is lower than normal. Potassium is a chemical (electrolyte) that helps regulate the amount of fluid in the body. It also stimulates muscle tightening (contraction) and helps nerves work properly. Normally, most of the body's potassium is inside cells, and only a very small amount is in the blood. Because the  amount in the blood is so small, minor changes to potassium levels in the blood can be life-threatening. What are the causes? This condition may be caused by:  Antibiotic medicine.  Diarrhea or vomiting. Taking too much of a medicine that helps you have a bowel movement (laxative) can cause diarrhea and lead to hypokalemia.  Chronic kidney disease (CKD).  Medicines that help the body get rid of excess fluid (diuretics).  Eating disorders, such as bulimia.  Low magnesium levels in the body.  Sweating a lot. What are the signs or symptoms? Symptoms of this condition include:  Weakness.  Constipation.  Fatigue.  Muscle cramps.  Mental confusion.  Skipped heartbeats or irregular heartbeat (palpitations).  Tingling or numbness. How is this diagnosed? This condition is diagnosed with a blood test. How is this treated? This condition may be treated by:  Taking potassium supplements by mouth.  Adjusting the medicines that you take.  Eating more foods that contain a lot of potassium. If your potassium level is very low, you may need to get potassium through an IV and be monitored in the hospital. Follow these instructions at home:   Take over-the-counter and prescription medicines only as told by your health care provider. This includes vitamins and supplements.  Eat a healthy diet. A healthy diet includes fresh fruits and vegetables, whole grains, healthy fats, and lean proteins.  If instructed, eat more foods that contain a lot of potassium. This includes: ? Nuts, such as peanuts and pistachios. ? Seeds, such as sunflower seeds and pumpkin seeds. ? Peas, lentils, and lima beans. ? Whole grain and bran cereals and breads. ? Fresh fruits and vegetables, such as apricots, avocado, bananas, cantaloupe, kiwi, oranges, tomatoes, asparagus, and potatoes. ? Orange juice. ? Tomato juice. ? Red meats. ? Yogurt.  Keep all follow-up visits as told by your health care  provider. This is important. Contact a health care provider if you:  Have weakness that gets worse.  Feel your heart pounding or racing.  Vomit.  Have diarrhea.  Have diabetes (diabetes mellitus) and you have trouble keeping your blood sugar (glucose) in your target range. Get help right away if you:  Have chest pain.  Have shortness of breath.  Have vomiting or diarrhea that lasts for more than 2 days.  Faint. Summary  Hypokalemia means that the amount of potassium in the blood is lower than normal.  This condition is diagnosed with a blood test.  Hypokalemia may be treated by taking potassium supplements, adjusting the medicines that you take, or eating more foods that are high in potassium.  If your potassium level is very low, you may need to get potassium through an IV and be monitored in the hospital. This information is not intended to replace advice given to you by your health care provider. Make sure you discuss any questions you have with your health care provider. Document Revised: 02/15/2018 Document Reviewed: 02/15/2018 Elsevier Patient Education  [place].

## 2020-06-24 ENCOUNTER — Other Ambulatory Visit: Payer: Self-pay

## 2020-06-24 ENCOUNTER — Other Ambulatory Visit (INDEPENDENT_AMBULATORY_CARE_PROVIDER_SITE_OTHER): Payer: BC Managed Care – PPO

## 2020-06-24 DIAGNOSIS — D72829 Elevated white blood cell count, unspecified: Secondary | ICD-10-CM | POA: Diagnosis not present

## 2020-06-24 DIAGNOSIS — E538 Deficiency of other specified B group vitamins: Secondary | ICD-10-CM

## 2020-06-24 DIAGNOSIS — E559 Vitamin D deficiency, unspecified: Secondary | ICD-10-CM

## 2020-06-24 DIAGNOSIS — E876 Hypokalemia: Secondary | ICD-10-CM | POA: Diagnosis not present

## 2020-06-24 LAB — CBC WITH DIFFERENTIAL/PLATELET
Basophils Absolute: 0 10*3/uL (ref 0.0–0.1)
Basophils Relative: 0.4 % (ref 0.0–3.0)
Eosinophils Absolute: 0.3 10*3/uL (ref 0.0–0.7)
Eosinophils Relative: 3.3 % (ref 0.0–5.0)
HCT: 35.4 % — ABNORMAL LOW (ref 36.0–46.0)
Hemoglobin: 11.6 g/dL — ABNORMAL LOW (ref 12.0–15.0)
Lymphocytes Relative: 40.7 % (ref 12.0–46.0)
Lymphs Abs: 3.3 10*3/uL (ref 0.7–4.0)
MCHC: 32.7 g/dL (ref 30.0–36.0)
MCV: 91.2 fl (ref 78.0–100.0)
Monocytes Absolute: 0.8 10*3/uL (ref 0.1–1.0)
Monocytes Relative: 9.9 % (ref 3.0–12.0)
Neutro Abs: 3.7 10*3/uL (ref 1.4–7.7)
Neutrophils Relative %: 45.7 % (ref 43.0–77.0)
Platelets: 305 10*3/uL (ref 150.0–400.0)
RBC: 3.88 Mil/uL (ref 3.87–5.11)
RDW: 16.5 % — ABNORMAL HIGH (ref 11.5–15.5)
WBC: 8.1 10*3/uL (ref 4.0–10.5)

## 2020-06-24 LAB — MAGNESIUM: Magnesium: 2 mg/dL (ref 1.5–2.5)

## 2020-06-24 LAB — BASIC METABOLIC PANEL
BUN: 9 mg/dL (ref 6–23)
CO2: 26 mEq/L (ref 19–32)
Calcium: 8.6 mg/dL (ref 8.4–10.5)
Chloride: 106 mEq/L (ref 96–112)
Creatinine, Ser: 0.85 mg/dL (ref 0.40–1.20)
GFR: 89.69 mL/min (ref >60.00)
Glucose, Bld: 82 mg/dL (ref 70–99)
Potassium: 4.1 mEq/L (ref 3.5–5.1)
Sodium: 138 mEq/L (ref 135–145)

## 2020-06-24 LAB — VITAMIN B12: Vitamin B-12: 117 pg/mL — ABNORMAL LOW (ref 211–911)

## 2020-06-24 LAB — VITAMIN D 25 HYDROXY (VIT D DEFICIENCY, FRACTURES): VITD: 9.32 ng/mL — ABNORMAL LOW (ref 30.00–100.00)

## 2020-06-25 ENCOUNTER — Other Ambulatory Visit: Payer: BC Managed Care – PPO

## 2020-06-26 ENCOUNTER — Other Ambulatory Visit: Payer: BC Managed Care – PPO

## 2020-06-27 ENCOUNTER — Other Ambulatory Visit: Payer: Self-pay

## 2020-06-27 ENCOUNTER — Encounter: Payer: Self-pay | Admitting: *Deleted

## 2020-06-27 DIAGNOSIS — N189 Chronic kidney disease, unspecified: Secondary | ICD-10-CM | POA: Diagnosis not present

## 2020-06-27 DIAGNOSIS — M545 Low back pain, unspecified: Secondary | ICD-10-CM | POA: Insufficient documentation

## 2020-06-27 DIAGNOSIS — F17210 Nicotine dependence, cigarettes, uncomplicated: Secondary | ICD-10-CM | POA: Insufficient documentation

## 2020-06-27 DIAGNOSIS — Y92410 Unspecified street and highway as the place of occurrence of the external cause: Secondary | ICD-10-CM | POA: Diagnosis not present

## 2020-06-27 DIAGNOSIS — M791 Myalgia, unspecified site: Secondary | ICD-10-CM | POA: Diagnosis not present
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
## 2020-06-28 ENCOUNTER — Emergency Department
Admission: EM | Admit: 2020-06-28 | Discharge: 2020-06-28 | Disposition: A | Discharge status: Home / Self Care | Payer: BC Managed Care – PPO | Attending: Emergency Medicine | Admitting: Emergency Medicine

## 2020-06-28 MED ORDER — HYDROCODONE-ACETAMINOPHEN 5-325 MG PO TABS
2.0000 | ORAL_TABLET | Freq: Once | ORAL | Status: AC
  Administered 2020-06-28: 2 via ORAL
  Filled 2020-06-28: qty 2

## 2020-06-28 MED ORDER — HYDROCODONE-ACETAMINOPHEN 5-325 MG PO TABS
1.0000 | ORAL_TABLET | ORAL | 0 refills | Status: DC | PRN
Start: 2020-06-28 — End: 2020-08-15

## 2020-06-28 NOTE — ED Notes (Signed)
Patient called for vital sign check with no asnwer

## 2020-06-28 NOTE — ED Notes (Signed)
Patient called for vital signs with no answer. 

## 2020-06-28 NOTE — ED Notes (Signed)
Patient verbalizes understanding of discharge instructions. Opportunity for questioning and answers were provided. Armband removed by staff, pt discharged from ED. Ambulated out to lobby  
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
## 2020-06-30 ENCOUNTER — Encounter: Payer: Self-pay | Admitting: Internal Medicine

## 2020-06-30 ENCOUNTER — Telehealth: Payer: Self-pay | Admitting: Internal Medicine

## 2020-06-30 ENCOUNTER — Other Ambulatory Visit: Payer: Self-pay | Admitting: Internal Medicine

## 2020-06-30 DIAGNOSIS — E876 Hypokalemia: Secondary | ICD-10-CM | POA: Insufficient documentation

## 2020-06-30 DIAGNOSIS — E559 Vitamin D deficiency, unspecified: Secondary | ICD-10-CM | POA: Insufficient documentation

## 2020-06-30 DIAGNOSIS — E538 Deficiency of other specified B group vitamins: Secondary | ICD-10-CM | POA: Insufficient documentation

## 2020-06-30 NOTE — Telephone Encounter (Signed)
Attempted to contact patient. Phone continued to ring and was disconnected. Could not leave message.

## 2020-06-30 NOTE — Telephone Encounter (Signed)
Has pt taken vitamin D before severe allergy to supplements on allergy list?

## 2020-07-02 DIAGNOSIS — M545 Low back pain, unspecified: Secondary | ICD-10-CM | POA: Diagnosis not present

## 2020-07-02 NOTE — Telephone Encounter (Signed)
Patient states she has never taken Vit D3. She will pick this up and try it. If having symptoms will stop and call back in.

## 2020-07-04 ENCOUNTER — Ambulatory Visit
Admission: RE | Admit: 2020-07-04 | Discharge: 2020-07-04 | Disposition: A | Discharge status: Home / Self Care | Payer: BC Managed Care – PPO | Attending: Hematology and Oncology | Admitting: Hematology and Oncology

## 2020-07-04 ENCOUNTER — Ambulatory Visit
Admission: RE | Admit: 2020-07-04 | Discharge: 2020-07-04 | Disposition: A | Discharge status: Home / Self Care | Payer: BC Managed Care – PPO | Source: Ambulatory Visit | Attending: Hematology and Oncology | Admitting: Hematology and Oncology

## 2020-07-04 ENCOUNTER — Other Ambulatory Visit: Payer: Self-pay | Admitting: Hematology and Oncology

## 2020-07-04 ENCOUNTER — Other Ambulatory Visit: Payer: Self-pay | Admitting: Emergency Medicine

## 2020-07-04 ENCOUNTER — Other Ambulatory Visit: Payer: Self-pay

## 2020-07-04 DIAGNOSIS — S233XXA Sprain of ligaments of thoracic spine, initial encounter: Secondary | ICD-10-CM | POA: Diagnosis not present

## 2020-07-04 DIAGNOSIS — S338XXA Sprain of other parts of lumbar spine and pelvis, initial encounter: Secondary | ICD-10-CM | POA: Diagnosis not present

## 2020-07-04 DIAGNOSIS — M546 Pain in thoracic spine: Secondary | ICD-10-CM | POA: Diagnosis not present

## 2020-07-04 DIAGNOSIS — M48061 Spinal stenosis, lumbar region without neurogenic claudication: Secondary | ICD-10-CM | POA: Diagnosis not present

## 2020-07-08 ENCOUNTER — Ambulatory Visit: Payer: BC Managed Care – PPO

## 2020-07-09 ENCOUNTER — Ambulatory Visit (INDEPENDENT_AMBULATORY_CARE_PROVIDER_SITE_OTHER): Payer: BC Managed Care – PPO

## 2020-07-09 ENCOUNTER — Other Ambulatory Visit: Payer: Self-pay

## 2020-07-09 ENCOUNTER — Telehealth: Payer: Self-pay | Admitting: Nurse Practitioner

## 2020-07-09 DIAGNOSIS — E538 Deficiency of other specified B group vitamins: Secondary | ICD-10-CM

## 2020-07-09 MED ORDER — CYANOCOBALAMIN 1000 MCG/ML IJ SOLN
1000.0000 ug | Freq: Once | INTRAMUSCULAR | Status: AC
  Administered 2020-07-09: 1000 ug via INTRAMUSCULAR

## 2020-07-09 NOTE — Progress Notes (Signed)
Patient presented for B 12 injection to left deltoid, patient voiced no concerns nor showed any signs of distress during injection. 

## 2020-07-09 NOTE — Telephone Encounter (Signed)
Noted  

## 2020-07-09 NOTE — Telephone Encounter (Signed)
Patient was advised at her nurse visit on 07/09/20 that Dawson Bills is no longer at our office and Dr. Aundra Dubin is unable to take her on as a new patient. Patient was advised that [facility] is taking Jerelene Redden patients up until Jul 19, 2020. Patient did not want the phone number for [facility] and stated she will call a office in [place].

## 2020-07-15 ENCOUNTER — Telehealth: Payer: Self-pay | Admitting: Nurse Practitioner

## 2020-07-15 NOTE — Telephone Encounter (Signed)
Patient will have three more b12 and then she will be transferring to new provider

## 2020-07-16 ENCOUNTER — Other Ambulatory Visit: Payer: Self-pay

## 2020-07-16 ENCOUNTER — Ambulatory Visit (INDEPENDENT_AMBULATORY_CARE_PROVIDER_SITE_OTHER): Payer: BC Managed Care – PPO

## 2020-07-16 DIAGNOSIS — E538 Deficiency of other specified B group vitamins: Secondary | ICD-10-CM

## 2020-07-16 MED ORDER — CYANOCOBALAMIN 1000 MCG/ML IJ SOLN
1000.0000 ug | Freq: Once | INTRAMUSCULAR | Status: AC
Start: 2020-07-16 — End: 2020-07-16
  Administered 2020-07-16: 1000 ug via INTRAMUSCULAR

## 2020-07-16 NOTE — Progress Notes (Signed)
Patient presented for B 12 injection to left deltoid, patient voiced no concerns nor showed any signs of distress during injection. 

## 2020-07-17 ENCOUNTER — Encounter: Payer: Self-pay | Admitting: Gastroenterology

## 2020-07-23 ENCOUNTER — Ambulatory Visit (INDEPENDENT_AMBULATORY_CARE_PROVIDER_SITE_OTHER): Payer: BC Managed Care – PPO

## 2020-07-23 ENCOUNTER — Other Ambulatory Visit: Payer: Self-pay

## 2020-07-23 DIAGNOSIS — E538 Deficiency of other specified B group vitamins: Secondary | ICD-10-CM

## 2020-07-23 MED ORDER — CYANOCOBALAMIN 1000 MCG/ML IJ SOLN
1000.0000 ug | Freq: Once | INTRAMUSCULAR | Status: AC
  Administered 2020-07-23: 1000 ug via INTRAMUSCULAR

## 2020-07-23 NOTE — Progress Notes (Signed)
Pt came in today for B-12 injection in right deltoid IM. Pt tolerated well with no signs or symptoms of distress.

## 2020-08-06 ENCOUNTER — Encounter: Payer: BC Managed Care – PPO | Admitting: Internal Medicine

## 2020-08-07 ENCOUNTER — Other Ambulatory Visit: Payer: Self-pay

## 2020-08-07 ENCOUNTER — Ambulatory Visit (INDEPENDENT_AMBULATORY_CARE_PROVIDER_SITE_OTHER): Payer: BC Managed Care – PPO | Admitting: Allergy

## 2020-08-07 ENCOUNTER — Encounter: Payer: Self-pay | Admitting: Allergy

## 2020-08-07 VITALS — BP 120/80 | HR 89 | Temp 98.7°F | Resp 18 | Ht 64.0 in | Wt 217.6 lb

## 2020-08-07 DIAGNOSIS — H1013 Acute atopic conjunctivitis, bilateral: Secondary | ICD-10-CM

## 2020-08-07 DIAGNOSIS — L300 Nummular dermatitis: Secondary | ICD-10-CM

## 2020-08-07 DIAGNOSIS — T7800XA Anaphylactic reaction due to unspecified food, initial encounter: Secondary | ICD-10-CM

## 2020-08-07 DIAGNOSIS — J3089 Other allergic rhinitis: Secondary | ICD-10-CM

## 2020-08-07 DIAGNOSIS — K2100 Gastro-esophageal reflux disease with esophagitis, without bleeding: Secondary | ICD-10-CM

## 2020-08-07 MED ORDER — EPINEPHRINE 0.3 MG/0.3ML IJ SOAJ: 0.3000 mg | Freq: Once | INTRAMUSCULAR | 1 refills | Status: AC

## 2020-08-07 NOTE — Patient Instructions (Addendum)
-   Environmental allergy testing is positive to grass pollens, weed pollens, tree pollens, mold, dust mite, cat, dog, mixed feathers, tobacco leaf - Allergen avoidance measures discussed/handouts provided. - Can continue Claritin 10 mg daily as needed - For itchy eyes can use over-the-counter Pataday 1 drop each eye daily as needed - For runny nose can use nasal antihistamine Astelin 2 sprays each nostril 1-2 times a day as needed - If medication management is not effective then consider allergen immunotherapy (allergy shots) which is a 3 to 5-year process to retrain your body to be tolerant to the above allergens  - Food allergy testing is positive to peanuts, cashew, almond, milk, rice, green pea/bean, orange, peach, cantaloupe and watermelon. - You have a component of pollen food allergy syndrome (see below)  - Continue your current food avoidance  - Have access to self-injectable epinephrine (Epipen or AuviQ) 0.3mg  at all times - Follow emergency action plan in case of allergic reaction  The oral allergy syndrome (OAS) or pollen-food allergy syndrome (PFAS) is a relatively common form of food allergy, particularly in adults. It typically occurs in people who have pollen allergies when the immune system "sees" proteins on the food that look like proteins on the pollen. This results in the allergy antibody (IgE) binding to the food instead of the pollen. Patients typically report itching and/or mild swelling of the mouth and throat immediately following ingestion of certain uncooked fruits (including nuts) or raw vegetables. Only a very small number of affected individuals experience systemic allergic reactions, such as anaphylaxis which occurs with true food allergies.       Eczema, nummular - Round patch on the side of your hand looks consistent with nummular eczema - Can use triamcinolone ointment to the area 1-2 times a day until improved - Keep area moisturized  Follow-up in 4 to 6  months or sooner if needed

## 2020-08-07 NOTE — Progress Notes (Signed)
New Patient Note  RE: Judy Conner MRN: [id number] DOB: 1985-12-22 Date of Office Visit: 08/07/2020  Referring provider: Orland Mustard * Primary care provider: Marval Regal, NP  Chief Complaint: Food allergy evaluation  History of present illness: Judy Conner is a 35 y.o. female presenting today for consultation for food allergy.  She feels she is allergic to a lot of different foods.   Last night she ate oxtails, plantains and rice and afterwards she developed itchy hives on her left shoulder.  She did not take anything for this as she knew she had this appoinmentt today.   She states she does not have any issues with any needs. She states she can't eat any fruits.   She states she can't eat apples but can eat apple sauce.  Oranges make her feel like her throat is closing.  Watermelon causes her gums to itch.  She recalls eating a sample of apricot at [employer] and felt like her throat was going to close.  Grapes, bananas, strawberry make her stomach feel like it is burning (she is not sure this is triggering her reflux or not).  String beans make her gums itch.   Milk makes her stomach feel like it is "curdling".   Lactaid milk does not cause any stomach symptoms but does cause diarrhea.  Red sauces aggravate her acid reflux as well as carbonated drinks.  All meds Nuts make her throat itch.    She has history of GERD. She does take pepcid as needed about 3-4 times a day.  Has also used tums.  Has used protonix in the past which she states was not effective at all.  She states the gummy Pepcid has been the most effective medication she has taken thus far for reflux control.  She has been to GI in the past and has had upper GI.    No history of asthma.  She does report dry, rough itchy patch on her hand that she just moisturizes.   She reports allergies to pollen.   Symptoms including sneezing, itchy eyes, runny nose, ear itch.  Will take claritin which does  help.      Review of systems: Review of Systems  Constitutional: Negative.   HENT: Negative.   Eyes: Negative.   Respiratory: Negative.   Cardiovascular: Negative.   Gastrointestinal: Positive for abdominal pain and heartburn.  Musculoskeletal: Negative.   Skin: Positive for itching and rash.  Neurological: Negative.     All other systems negative unless noted above in HPI  Past medical history: Past Medical History:  Diagnosis Date  . Anemia    hx low iron   . Chronic kidney disease    hx uti none currently  . Dysmenorrhea, unspecified 12/12/2019  . GERD (gastroesophageal reflux disease)   . Headache(784.0)   . History of umbilical hernia 8/67/6195  . Sciatica   . Urticaria     Past surgical history: Past Surgical History:  Procedure Laterality Date  . CESAREAN SECTION  [phone]  . FINGER FRACTURE SURGERY     left pinky    Family history:  Family History  Problem Relation Age of Onset  . Breast cancer Maternal Grandmother   . Diabetes Maternal Grandmother   . Clotting disorder Paternal Uncle   . Heart disease Father   . Hypertension Father   . Kidney disease Father     Social history: Lives in a home with out carpeting with gas heating and central cooling.  Cat  in the home.  There is no concern for roaches in the home.  There is concern for water damage or mildew in the home.  She is a [employer].  She does smoke half a pack or less per day for the past 11 years.  Medication List: Current Outpatient Medications  Medication Sig Dispense Refill  . EPINEPHrine (EPIPEN 2-PAK) 0.3 mg/0.3 mL IJ SOAJ injection Inject 0.3 mg into the muscle once for 1 dose. 2 each 1  . famotidine (PEPCID) 20 MG tablet Take 1 tablet (20 mg total) by mouth 2 (two) times daily. 60 tablet 0  . nystatin (MYCOSTATIN) 100000 UNIT/ML suspension Take 5 mLs (500,000 Units total) by mouth 4 (four) times daily. Gargle and hold x 2-5 minutes and spit x 7-14 days 473 mL 1  . potassium chloride  SA (KLOR-CON) 20 MEQ tablet Take 1 tablet (20 mEq total) by mouth 2 (two) times daily. 60 tablet 1  . promethazine (PHENERGAN) 12.5 MG tablet Take 1-2 tablets (12.5-25 mg total) by mouth 2 (two) times daily as needed for nausea or vomiting. 60 tablet 0  . HYDROcodone-acetaminophen (NORCO/VICODIN) 5-325 MG tablet Take 1 tablet by mouth every 4 (four) hours as needed. (Patient not taking: Reported on 08/07/2020) 12 tablet 0   No current facility-administered medications for this visit.    Known medication allergies: Allergies  Allergen Reactions  . Fruit & Vegetable Daily [Nutritional Supplements] Anaphylaxis    Fruits  . Fentanyl      Physical examination: Blood pressure 120/80, pulse 89, temperature 98.7 F (37.1 C), resp. rate 18, height 5\' 4"  (1.626 m), weight 217 lb 9.6 oz (98.7 kg), SpO2 98 %, unknown if currently breastfeeding.  General: Alert, interactive, in no acute distress. HEENT: PERRLA, TMs pearly gray, turbinates non-edematous without discharge, post-pharynx non erythematous. Neck: Supple without lymphadenopathy. Lungs: Clear to auscultation without wheezing, rhonchi or rales. {no increased work of breathing. CV: Normal S1, S2 without murmurs. Abdomen: Nondistended, nontender. Skin: Warm and dry, without lesions or rashes. Extremities:  No clubbing, cyanosis or edema. Neuro:   Grossly intact.  Diagnositics/Labs:  Allergy testing: Environmental allergy skin prick testing is positive to grass pollens, weed pollens, tree pollens, mold, dust mite, cat, dog, mixed feathers, tobacco leaf. Select food allergy skin prick testing is positive to peanuts, cashew, almond, milk, rice, green pea/bean, orange, peach, cantaloupe and watermelon. Allergy testing results were read and interpreted by provider, documented by clinical staff.   Assessment and plan: Adverse food reaction/anaphylaxis due to food Pollen food allergy syndrome Allergic rhinitis with  conjunctivitis GERD Nummular eczema   - Environmental allergy testing is positive to grass pollens, weed pollens, tree pollens, mold, dust mite, cat, dog, mixed feathers, tobacco leaf - Allergen avoidance measures discussed/handouts provided. - Can continue Claritin 10 mg daily as needed - For itchy eyes can use over-the-counter Pataday 1 drop each eye daily as needed - For runny nose can use nasal antihistamine Astelin 2 sprays each nostril 1-2 times a day as needed - If medication management is not effective then consider allergen immunotherapy (allergy shots) which is a 3 to 5-year process to retrain your body to be tolerant to the above allergens  - Food allergy testing is positive to peanuts, cashew, almond, milk, rice, green pea/bean, orange, peach, cantaloupe and watermelon. - You have a component of pollen food allergy syndrome (see below)  - Continue your current food avoidance  - Have access to self-injectable epinephrine (Epipen or AuviQ) 0.3mg  at all times -  Follow emergency action plan in case of allergic reaction  The oral allergy syndrome (OAS) or pollen-food allergy syndrome (PFAS) is a relatively common form of food allergy, particularly in adults. It typically occurs in people who have pollen allergies when the immune system "sees" proteins on the food that look like proteins on the pollen. This results in the allergy antibody (IgE) binding to the food instead of the pollen. Patients typically report itching and/or mild swelling of the mouth and throat immediately following ingestion of certain uncooked fruits (including nuts) or raw vegetables. Only a very small number of affected individuals experience systemic allergic reactions, such as anaphylaxis which occurs with true food allergies.      GERD - Continue Pepcid twice a day - Continue recommendations from your gastroenterologist - Continue dietary and lifestyle modification  Eczema, nummular - Round patch on the  side of your hand looks consistent with nummular eczema - Can use triamcinolone ointment to the area 1-2 times a day until improved - Keep area moisturized  Follow-up in 4 to 6 months or sooner if needed  I appreciate the opportunity to take part in Kiyonna's care. Please do not hesitate to contact me with questions.  Sincerely,   Prudy Feeler, MD Allergy/Immunology [facility]

## 2020-08-15 ENCOUNTER — Ambulatory Visit (INDEPENDENT_AMBULATORY_CARE_PROVIDER_SITE_OTHER): Payer: BC Managed Care – PPO | Admitting: Internal Medicine

## 2020-08-15 ENCOUNTER — Other Ambulatory Visit: Payer: Self-pay

## 2020-08-15 ENCOUNTER — Encounter: Payer: Self-pay | Admitting: Internal Medicine

## 2020-08-15 VITALS — BP 126/78 | HR 96 | Temp 98.3°F | Resp 16 | Ht 64.0 in | Wt 217.0 lb

## 2020-08-15 DIAGNOSIS — E876 Hypokalemia: Secondary | ICD-10-CM | POA: Diagnosis not present

## 2020-08-15 DIAGNOSIS — D510 Vitamin B12 deficiency anemia due to intrinsic factor deficiency: Secondary | ICD-10-CM | POA: Insufficient documentation

## 2020-08-15 DIAGNOSIS — D539 Nutritional anemia, unspecified: Secondary | ICD-10-CM | POA: Diagnosis not present

## 2020-08-15 DIAGNOSIS — B370 Candidal stomatitis: Secondary | ICD-10-CM | POA: Insufficient documentation

## 2020-08-15 DIAGNOSIS — E519 Thiamine deficiency, unspecified: Secondary | ICD-10-CM

## 2020-08-15 DIAGNOSIS — R7989 Other specified abnormal findings of blood chemistry: Secondary | ICD-10-CM

## 2020-08-15 DIAGNOSIS — E611 Iron deficiency: Secondary | ICD-10-CM

## 2020-08-15 LAB — IRON: Iron: 4 ug/dL — ABNORMAL LOW (ref 42–145)

## 2020-08-15 LAB — HEPATIC FUNCTION PANEL
ALT: 10 U/L (ref 0–35)
AST: 12 U/L (ref 0–37)
Albumin: 3.9 g/dL (ref 3.5–5.2)
Alkaline Phosphatase: 53 U/L (ref 39–117)
Bilirubin, Direct: 0 mg/dL (ref 0.0–0.3)
Total Bilirubin: 0.2 mg/dL (ref 0.2–1.2)
Total Protein: 7.1 g/dL (ref 6.0–8.3)

## 2020-08-15 LAB — CBC WITH DIFFERENTIAL/PLATELET
Basophils Absolute: 0 10*3/uL (ref 0.0–0.1)
Basophils Relative: 0.4 % (ref 0.0–3.0)
Eosinophils Absolute: 0.2 10*3/uL (ref 0.0–0.7)
Eosinophils Relative: 2.9 % (ref 0.0–5.0)
HCT: 36.4 % (ref 36.0–46.0)
Hemoglobin: 12 g/dL (ref 12.0–15.0)
Lymphocytes Relative: 41 % (ref 12.0–46.0)
Lymphs Abs: 3.4 10*3/uL (ref 0.7–4.0)
MCHC: 32.9 g/dL (ref 30.0–36.0)
MCV: 89 fl (ref 78.0–100.0)
Monocytes Absolute: 0.7 10*3/uL (ref 0.1–1.0)
Monocytes Relative: 9 % (ref 3.0–12.0)
Neutro Abs: 3.8 10*3/uL (ref 1.4–7.7)
Neutrophils Relative %: 46.7 % (ref 43.0–77.0)
Platelets: 304 10*3/uL (ref 150.0–400.0)
RBC: 4.08 Mil/uL (ref 3.87–5.11)
RDW: 16.5 % — ABNORMAL HIGH (ref 11.5–15.5)
WBC: 8.2 10*3/uL (ref 4.0–10.5)

## 2020-08-15 LAB — BASIC METABOLIC PANEL
BUN: 5 mg/dL — ABNORMAL LOW (ref 6–23)
CO2: 27 mEq/L (ref 19–32)
Calcium: 9.1 mg/dL (ref 8.4–10.5)
Chloride: 106 mEq/L (ref 96–112)
Creatinine, Ser: 0.86 mg/dL (ref 0.40–1.20)
GFR: 88.35 mL/min (ref >60.00)
Glucose, Bld: 79 mg/dL (ref 70–99)
Potassium: 3.1 mEq/L — ABNORMAL LOW (ref 3.5–5.1)
Sodium: 140 mEq/L (ref 135–145)

## 2020-08-15 LAB — FERRITIN: Ferritin: 4.3 ng/mL — ABNORMAL LOW (ref 10.0–291.0)

## 2020-08-15 LAB — PROTIME-INR
INR: 1.1 ratio — ABNORMAL HIGH (ref 0.8–1.0)
Prothrombin Time: 11.8 s (ref 9.6–13.1)

## 2020-08-15 LAB — VITAMIN B12: Vitamin B-12: 370 pg/mL (ref 211–911)

## 2020-08-15 LAB — FOLATE: Folate: 9.8 ng/mL (ref >5.9)

## 2020-08-15 MED ORDER — FERROUS SULFATE 220 (44 FE) MG/5ML PO ELIX: 220.0000 mg | ORAL_SOLUTION | Freq: Every day | ORAL | 1 refills | Status: DC

## 2020-08-15 MED ORDER — POTASSIUM CHLORIDE CRYS ER 20 MEQ PO TBCR: 20.0000 meq | EXTENDED_RELEASE_TABLET | Freq: Three times a day (TID) | ORAL | 0 refills | Status: DC

## 2020-08-15 MED ORDER — FLUCONAZOLE 100 MG PO TABS
100.0000 mg | ORAL_TABLET | Freq: Every day | ORAL | 0 refills | Status: AC
Start: 2020-08-15 — End: 2020-08-25

## 2020-08-15 NOTE — Patient Instructions (Signed)
[employer] medicine (25th ed., pp. [phone]). [place], [place]: Elsevier.">  Anemia  Anemia is a condition in which there is not enough red blood cells or hemoglobin in the blood. Hemoglobin is a substance in red blood cells that carries oxygen. When you do not have enough red blood cells or hemoglobin (are anemic), your body cannot get enough oxygen and your organs may not work properly. As a result, you may feel very tired or have other problems. What are the causes? Common causes of anemia include:  Excessive bleeding. Anemia can be caused by excessive bleeding inside or outside the body, including bleeding from the intestines or from heavy menstrual periods in females.  Poor nutrition.  Long-lasting (chronic) kidney, thyroid, and liver disease.  Bone marrow disorders, spleen problems, and blood disorders.  Cancer and treatments for cancer.  HIV (human immunodeficiency virus) and AIDS (acquired immunodeficiency syndrome).  Infections, medicines, and autoimmune disorders that destroy red blood cells. What are the signs or symptoms? Symptoms of this condition include:  Minor weakness.  Dizziness.  Headache, or difficulties concentrating and sleeping.  Heartbeats that feel irregular or faster than normal (palpitations).  Shortness of breath, especially with exercise.  Pale skin, lips, and nails, or cold hands and feet.  Indigestion and nausea. Symptoms may occur suddenly or develop slowly. If your anemia is mild, you may not have symptoms. How is this diagnosed? This condition is diagnosed based on blood tests, your medical history, and a physical exam. In some cases, a test may be needed in which cells are removed from the soft tissue inside of a bone and looked at under a microscope (bone marrow biopsy). Your health care provider may also check your stool (feces) for blood and may do additional testing to look for the cause of your bleeding. Other tests may  include:  Imaging tests, such as a CT scan or MRI.  A procedure to see inside your esophagus and stomach (endoscopy).  A procedure to see inside your colon and rectum (colonoscopy). How is this treated? Treatment for this condition depends on the cause. If you continue to lose a lot of blood, you may need to be treated at a hospital. Treatment may include:  Taking supplements of iron, vitamin Q68, or folic acid.  Taking a hormone medicine (erythropoietin) that can help to stimulate red blood cell growth.  Having a blood transfusion. This may be needed if you lose a lot of blood.  Making changes to your diet.  Having surgery to remove your spleen. Follow these instructions at home:  Take over-the-counter and prescription medicines only as told by your health care provider.  Take supplements only as told by your health care provider.  Follow any diet instructions that you were given by your health care provider.  Keep all follow-up visits as told by your health care provider. This is important. Contact a health care provider if:  You develop new bleeding anywhere in the body. Get help right away if:  You are very weak.  You are short of breath.  You have pain in your abdomen or chest.  You are dizzy or feel faint.  You have trouble concentrating.  You have bloody stools, black stools, or tarry stools.  You vomit repeatedly or you vomit up blood. These symptoms may represent a serious problem that is an emergency. Do not wait to see if the symptoms will go away. Get medical help right away. Call your local emergency services (911 in the [place] Do not  drive yourself to the hospital. Summary  Anemia is a condition in which you do not have enough red blood cells or enough of a substance in your red blood cells that carries oxygen (hemoglobin).  Symptoms may occur suddenly or develop slowly.  If your anemia is mild, you may not have symptoms.  This condition is  diagnosed with blood tests, a medical history, and a physical exam. Other tests may be needed.  Treatment for this condition depends on the cause of the anemia. This information is not intended to replace advice given to you by your health care provider. Make sure you discuss any questions you have with your health care provider. Document Revised: 06/12/2019 Document Reviewed: 06/12/2019 Elsevier Patient Education  2021 Elsevier Inc.  

## 2020-08-15 NOTE — Progress Notes (Signed)
Subjective:  Patient ID: Judy Conner, female    DOB: 06-29-1986  Age: 35 y.o. MRN: [id number]  CC: Anemia  This visit occurred during the SARS-CoV-2 public health emergency.  Safety protocols were in place, including screening questions prior to the visit, additional usage of staff PPE, and extensive cleaning of exam room while observing appropriate contact time as indicated for disinfecting solutions.   NEW TO ME  HPI Judy Conner presents for establishing.  She complains that she has oral thrush.  She recently took a course of antibiotics for bladder infection.  She is not gotten much symptom relief with nystatin suspension.  She tells me she has chronic GI symptoms including pain, nausea, and vomiting.  It sounds like she has been evaluated elsewhere and may have gastroparesis.  She has an upcoming GI appointment. She was recently evaluated for anemia and was found to have B12 deficiency. She has received one B12 injection.  History Judy Conner has a past medical history of Anemia, Chronic kidney disease, Dysmenorrhea, unspecified (12/12/2019), GERD (gastroesophageal reflux disease), Headache(784.0), History of umbilical hernia (123456), Sciatica, and Urticaria.   She has a past surgical history that includes Cesarean section (2006+2008+2010) and Finger fracture surgery.   Her family history includes Breast cancer in her maternal grandmother; Clotting disorder in her paternal uncle; Diabetes in her maternal grandmother; Heart disease in her father; Hypertension in her father; Kidney disease in her father.She reports that she has been smoking cigarettes. She has a 1.00 pack-year smoking history. She has never used smokeless tobacco. She reports that she does not drink alcohol and does not use drugs.  Outpatient Medications Prior to Visit  Medication Sig Dispense Refill  . famotidine (PEPCID) 20 MG tablet Take 1 tablet (20 mg total) by mouth 2 (two) times daily. 60 tablet 0  .  HYDROcodone-acetaminophen (NORCO/VICODIN) 5-325 MG tablet Take 1 tablet by mouth every 4 (four) hours as needed. 12 tablet 0  . nystatin (MYCOSTATIN) 100000 UNIT/ML suspension Take 5 mLs (500,000 Units total) by mouth 4 (four) times daily. Gargle and hold x 2-5 minutes and spit x 7-14 days 473 mL 1  . potassium chloride SA (KLOR-CON) 20 MEQ tablet Take 1 tablet (20 mEq total) by mouth 2 (two) times daily. 60 tablet 1  . promethazine (PHENERGAN) 12.5 MG tablet Take 1-2 tablets (12.5-25 mg total) by mouth 2 (two) times daily as needed for nausea or vomiting. 60 tablet 0   No facility-administered medications prior to visit.    ROS Review of Systems  Constitutional: Positive for fatigue. Negative for appetite change, chills, diaphoresis, fever and unexpected weight change.  HENT: Negative.  Negative for sore throat and trouble swallowing.        Red sore tongue  Eyes: Negative.   Respiratory: Negative for choking, chest tightness, shortness of breath and wheezing.   Cardiovascular: Negative for chest pain, palpitations and leg swelling.  Gastrointestinal: Positive for abdominal pain, nausea and vomiting. Negative for blood in stool, constipation, diarrhea and rectal pain.  Genitourinary: Negative.  Negative for difficulty urinating.  Musculoskeletal: Positive for arthralgias and back pain. Negative for myalgias.  Skin: Negative.  Negative for color change and rash.  Neurological: Negative.  Negative for dizziness, weakness and light-headedness.  Hematological: Negative for adenopathy. Does not bruise/bleed easily.  Psychiatric/Behavioral: Negative.     Objective:  BP 126/78   Pulse 96   Temp 98.3 F (36.8 C) (Oral)   Resp 16   Ht 5\' 4"  (1.626 m)   Wt  217 lb (98.4 kg)   LMP 08/14/2020   SpO2 98%   BMI 37.25 kg/m   Physical Exam Vitals reviewed.  Constitutional:      General: She is not in acute distress.    Appearance: She is obese. She is not toxic-appearing or diaphoretic.   HENT:     Nose: Nose normal.     Mouth/Throat:     Mouth: Mucous membranes are moist.  Eyes:     General: No scleral icterus.    Conjunctiva/sclera: Conjunctivae normal.  Cardiovascular:     Rate and Rhythm: Normal rate and regular rhythm.     Pulses: Normal pulses.     Heart sounds: No murmur heard.   Pulmonary:     Effort: Pulmonary effort is normal.     Breath sounds: No stridor. No wheezing, rhonchi or rales.  Abdominal:     General: Abdomen is protuberant. Bowel sounds are normal. There is no distension.     Palpations: Abdomen is soft. There is no hepatomegaly, splenomegaly or mass.     Tenderness: There is no abdominal tenderness.  Musculoskeletal:        General: Normal range of motion.     Cervical back: Neck supple.     Right lower leg: No edema.     Left lower leg: No edema.  Lymphadenopathy:     Cervical: No cervical adenopathy.  Skin:    General: Skin is warm and dry.  Neurological:     General: No focal deficit present.  Psychiatric:        Mood and Affect: Mood normal.        Behavior: Behavior normal.     Lab Results  Component Value Date   WBC 8.2 08/15/2020   HGB 12.0 08/15/2020   HCT 36.4 08/15/2020   PLT 304.0 08/15/2020   GLUCOSE 79 08/15/2020   CHOL 148 12/11/2019   TRIG 108.0 12/11/2019   HDL 30.40 (L) 12/11/2019   LDLCALC 96 12/11/2019   ALT 10 08/15/2020   AST 12 08/15/2020   NA 140 08/15/2020   K 3.1 (L) 08/15/2020   CL 106 08/15/2020   CREATININE 0.86 08/15/2020   BUN 5 (L) 08/15/2020   CO2 27 08/15/2020   TSH 1.01 12/11/2019   INR 1.1 (H) 08/15/2020   HGBA1C 5.8 12/11/2019    Assessment & Plan:   Judy Conner was seen today for anemia.  Diagnoses and all orders for this visit:  Elevated LFTs- Screening for viral hepatitis is negative. This is likely NASH. I recommended she get vaccinated against hepatitis A and B. -     Hepatic function panel; Future -     Hepatitis A antibody, total; Future -     Hepatitis B core  antibody, total; Future -     Hepatitis C antibody; Future -     Hepatitis B surface antigen; Future -     Hepatitis B surface antibody,quantitative; Future -     Protime-INR; Future -     Protime-INR -     Hepatitis B surface antibody,quantitative -     Hepatitis B surface antigen -     Hepatitis C antibody -     Hepatitis B core antibody, total -     Hepatitis A antibody, total -     Hepatic function panel  Deficiency anemia- Her H&H are normal but she has a significant iron deficiency anemia. I recommend that she receive a series of iron infusions. She is also deficient in thiamine. -  CBC with Differential/Platelet; Future -     Vitamin B1; Future -     Iron; Future -     Ferritin; Future -     Folate; Future -     Folate -     Ferritin -     Iron -     Vitamin B1 -     CBC with Differential/Platelet  Vitamin B12 deficiency anemia due to intrinsic factor deficiency- I recommended she continue parenteral B12 replacement therapy. -     CBC with Differential/Platelet; Future -     Vitamin B12; Future -     Folate; Future -     Folate -     Vitamin B12 -     CBC with Differential/Platelet  Hypokalemia -     Basic metabolic panel; Future -     Basic metabolic panel -     potassium chloride SA (KLOR-CON) 20 MEQ tablet; Take 1 tablet (20 mEq total) by mouth 3 (three) times daily.  Oral candidiasis -     fluconazole (DIFLUCAN) 100 MG tablet; Take 1 tablet (100 mg total) by mouth daily for 10 days.  Iron deficiency -     ferrous sulfate 220 (44 Fe) MG/5ML solution; Take 5 mLs (220 mg total) by mouth daily.  Thiamine deficiency -     thiamine (VITAMIN B-1) 50 MG tablet; Take 1 tablet (50 mg total) by mouth daily.   I have discontinued Judy Conner's promethazine, nystatin, and HYDROcodone-acetaminophen. I have also changed her potassium chloride SA. Additionally, I am having her start on fluconazole, ferrous sulfate, and thiamine. Lastly, I am having her maintain her  famotidine.  Meds ordered this encounter  Medications  . fluconazole (DIFLUCAN) 100 MG tablet    Sig: Take 1 tablet (100 mg total) by mouth daily for 10 days.    Dispense:  10 tablet    Refill:  0  . ferrous sulfate 220 (44 Fe) MG/5ML solution    Sig: Take 5 mLs (220 mg total) by mouth daily.    Dispense:  473 mL    Refill:  1  . potassium chloride SA (KLOR-CON) 20 MEQ tablet    Sig: Take 1 tablet (20 mEq total) by mouth 3 (three) times daily.    Dispense:  270 each    Refill:  0  . thiamine (VITAMIN B-1) 50 MG tablet    Sig: Take 1 tablet (50 mg total) by mouth daily.    Dispense:  90 tablet    Refill:  1     Follow-up: Return in about 3 months (around 11/13/2020).  Scarlette Calico, MD

## 2020-08-18 ENCOUNTER — Encounter: Payer: BC Managed Care – PPO | Admitting: Internal Medicine

## 2020-08-19 LAB — HEPATITIS C ANTIBODY
Hepatitis C Ab: NONREACTIVE
SIGNAL TO CUT-OFF: 0.02 (ref <1.00)

## 2020-08-19 LAB — HEPATITIS B CORE ANTIBODY, TOTAL: Hep B Core Total Ab: NONREACTIVE

## 2020-08-19 LAB — HEPATITIS B SURFACE ANTIGEN: Hepatitis B Surface Ag: NONREACTIVE

## 2020-08-19 LAB — HEPATITIS A ANTIBODY, TOTAL: Hepatitis A AB,Total: NONREACTIVE

## 2020-08-19 LAB — HEPATITIS B SURFACE ANTIBODY, QUANTITATIVE: Hep B S AB Quant (Post): <5 m[IU]/mL — ABNORMAL LOW (ref >=10)

## 2020-08-19 LAB — VITAMIN B1: Vitamin B1 (Thiamine): 6 nmol/L — ABNORMAL LOW (ref 8–30)

## 2020-08-20 DIAGNOSIS — E519 Thiamine deficiency, unspecified: Secondary | ICD-10-CM | POA: Insufficient documentation

## 2020-08-20 MED ORDER — VITAMIN B-1 50 MG PO TABS
50.0000 mg | ORAL_TABLET | Freq: Every day | ORAL | 1 refills | Status: DC
Start: 2020-08-20 — End: 2020-12-28

## 2020-08-21 ENCOUNTER — Ambulatory Visit: Payer: BC Managed Care – PPO

## 2020-08-26 ENCOUNTER — Encounter: Payer: Self-pay | Admitting: Gastroenterology

## 2020-08-26 ENCOUNTER — Ambulatory Visit (INDEPENDENT_AMBULATORY_CARE_PROVIDER_SITE_OTHER): Payer: BC Managed Care – PPO | Admitting: Gastroenterology

## 2020-08-26 VITALS — BP 96/68 | HR 96 | Ht 65.25 in | Wt 215.2 lb

## 2020-08-26 DIAGNOSIS — R112 Nausea with vomiting, unspecified: Secondary | ICD-10-CM

## 2020-08-26 DIAGNOSIS — K219 Gastro-esophageal reflux disease without esophagitis: Secondary | ICD-10-CM | POA: Diagnosis not present

## 2020-08-26 MED ORDER — PANTOPRAZOLE SODIUM 40 MG PO TBEC: 40.0000 mg | DELAYED_RELEASE_TABLET | Freq: Every day | ORAL | 11 refills | Status: DC

## 2020-08-26 NOTE — Progress Notes (Signed)
History of Present Illness: This is a 35 year old female referred by Scarlette Calico, MD for the evaluation of GERD and intermittent N/V.  The symptoms have been present for many years.  She frequently notes nausea and vomiting at the beginning of her menstrual period.  Sometimes reflux symptoms touch off nausea and vomiting.  Other times she feels certain foods cause nausea and vomiting.  Zofran was not effective and she was recently changed to promethazine which has been very effective.  EGD in 05/2012 for GERD, N/V was normal.  Denies weight loss, abdominal pain, constipation, diarrhea, change in stool caliber, melena, hematochezia, dysphagia, chest pain.    Allergies  Allergen Reactions  . Fruit & Vegetable Daily [Nutritional Supplements] Anaphylaxis    Fruits  . Fentanyl    Outpatient Medications Prior to Visit  Medication Sig Dispense Refill  . famotidine (PEPCID) 20 MG tablet Take 1 tablet (20 mg total) by mouth 2 (two) times daily. 60 tablet 0  . ferrous sulfate 220 (44 Fe) MG/5ML solution Take 5 mLs (220 mg total) by mouth daily. (Patient taking differently: Take 220 mg by mouth as needed.) 473 mL 1  . potassium chloride SA (KLOR-CON) 20 MEQ tablet Take 1 tablet (20 mEq total) by mouth 3 (three) times daily. 270 each 0  . promethazine (PHENERGAN) 25 MG tablet Take 25 mg by mouth as needed for nausea or vomiting.    . thiamine (VITAMIN B-1) 50 MG tablet Take 1 tablet (50 mg total) by mouth daily. (Patient not taking: Reported on 08/26/2020) 90 tablet 1   No facility-administered medications prior to visit.   Past Medical History:  Diagnosis Date  . Anemia    hx low iron   . Chronic kidney disease    hx uti none currently  . Dysmenorrhea, unspecified 12/12/2019  . GERD (gastroesophageal reflux disease)   . Headache(784.0)   . History of umbilical hernia 3/57/0177  . Low serum potassium   . Sciatica   . Urticaria    Past Surgical History:  Procedure Laterality Date  .  CESAREAN SECTION  [phone]   x 3  . FINGER FRACTURE SURGERY Left    pinky   Social History   Socioeconomic History  . Marital status: Single    Spouse name: Not on file  . Number of children: 3  . Years of education: Not on file  . Highest education level: Associate degree: occupational, Hotel manager, or vocational program  Occupational History  . Occupation: Forensic psychologist: [employer]  Tobacco Use  . Smoking status: Current Some Day Smoker    Packs/day: 0.25    Years: 4.00    Pack years: 1.00    Types: Cigarettes  . Smokeless tobacco: Never Used  Vaping Use  . Vaping Use: Never used  Substance and Sexual Activity  . Alcohol use: Yes    Comment: occasional  . Drug use: No  . Sexual activity: Yes  Other Topics Concern  . Not on file  Social History Narrative   Divorced- lives with 3 kids and single parent since 2019   Social Determinants of Health   Financial Resource Strain: Not on file  Food Insecurity: Not on file  Transportation Needs: Not on file  Physical Activity: Not on file  Stress: Not on file  Social Connections: Not on file   Family History  Problem Relation Age of Onset  . Breast cancer Maternal Grandmother   . Diabetes Maternal Grandmother   .  Clotting disorder Paternal Uncle   . Heart disease Father   . Hypertension Father   . Crohn's disease Father   . Hypertension Sister       Review of Systems: Pertinent positive and negative review of systems were noted in the above HPI section. All other review of systems were otherwise negative.    Physical Exam: General: Well developed, well nourished, no acute distress Head: Normocephalic and atraumatic Eyes: Sclerae anicteric, EOMI Ears: Normal auditory acuity Mouth: Not examined, mask on during Covid-19 pandemic Neck: Supple, no masses or thyromegaly Lungs: Clear throughout to auscultation Heart: Regular rate and rhythm; no murmurs, rubs or bruits Abdomen: Soft, non tender and non  distended. No masses, hepatosplenomegaly or hernias noted. Normal Bowel sounds Rectal: Not done Musculoskeletal: Symmetrical with no gross deformities  Skin: No lesions on visible extremities Pulses:  Normal pulses noted Extremities: No clubbing, cyanosis, edema or deformities noted Neurological: Alert oriented x 4, grossly nonfocal Cervical Nodes:  No significant cervical adenopathy Inguinal Nodes: No significant inguinal adenopathy Psychological:  Alert and cooperative. Normal mood and affect   Assessment and Recommendations:  1. GERD with intermittent N/V related to GERD, menstrual cycle, and possible food sensitivities.  Intensify antireflux therapy.  Closely follow antireflux measures.  Begin pantoprazole 40 mg daily. Change famotidine to 20 mg p.o. twice daily as needed.  Tums as needed.  Continue promethazine 12.5 to 25 mg p.o. 3 times daily as needed nausea, vomiting.  If symptoms are not adequately controlled will increase pantoprazole to twice daily.  Advised her to speak with her gynecologist regarding cramping with her menstrual cycles.  If symptoms controlled REV in 1 year.  If symptoms not controlled REV sooner and consider repeat EGD.  2.  Mild ALT elevation in September 2021.  LFTs normalized in January 2022.  Viral hepatitis markers were negative.  Repeat LFTs in 2 to 3 months per her PCP.  If LFTs remain consistently elevated for 6 months plan for further serologic evaluation and RUQ [place].  Follow-up with her PCP, Dr. Ronnald Ramp.  3.  Iron deficiency anemia.  Continue treatment with ferrous sulfate solution daily.  Follow-up with her PCP, Dr. Ronnald Ramp.  cc: Scarlette Calico, MD

## 2020-08-26 NOTE — Patient Instructions (Addendum)
Take your famotidine twice daily as needed.   We have sent the following medications to your pharmacy for you to pick up at your convenience: pantoprazole.   Patient advised to avoid spicy, acidic, citrus, chocolate, mints, fruit and fruit juices.  Limit the intake of caffeine, alcohol and Soda.  Don't exercise too soon after eating.  Don't lie down within 3-4 hours of eating.  Elevate the head of your bed.  Thank you for choosing me and [facility].  Pricilla Riffle. Dagoberto Ligas., MD., Marval Regal

## 2020-08-27 ENCOUNTER — Encounter: Payer: Self-pay | Admitting: Internal Medicine

## 2020-08-27 DIAGNOSIS — M5459 Other low back pain: Secondary | ICD-10-CM | POA: Diagnosis not present

## 2020-09-02 DIAGNOSIS — M5136 Other intervertebral disc degeneration, lumbar region: Secondary | ICD-10-CM | POA: Diagnosis not present

## 2020-09-15 ENCOUNTER — Telehealth: Payer: Self-pay | Admitting: Internal Medicine

## 2020-09-15 ENCOUNTER — Other Ambulatory Visit: Payer: Self-pay | Admitting: Internal Medicine

## 2020-09-15 DIAGNOSIS — B370 Candidal stomatitis: Secondary | ICD-10-CM

## 2020-09-15 DIAGNOSIS — B373 Candidiasis of vulva and vagina: Secondary | ICD-10-CM

## 2020-09-15 DIAGNOSIS — B3731 Acute candidiasis of vulva and vagina: Secondary | ICD-10-CM

## 2020-09-15 MED ORDER — FLUCONAZOLE 150 MG PO TABS: 150.0000 mg | ORAL_TABLET | Freq: Once | ORAL | 0 refills | Status: AC

## 2020-09-15 NOTE — Telephone Encounter (Signed)
1.Medication Requested: fluconazole (DIFLUCAN) 100 MG tablet    2. Pharmacy (Name, Street, [place]): [facility] (N), [place] - [address]  3. On Med List: no  4. Last Visit with PCP: 1.28.22  5. Next visit date with PCP: n/a    Agent: Please be advised that RX refills may take up to 3 business days. We ask that you follow-up with your pharmacy.

## 2020-09-18 ENCOUNTER — Ambulatory Visit: Payer: BC Managed Care – PPO | Admitting: Internal Medicine

## 2020-10-08 DIAGNOSIS — M545 Low back pain, unspecified: Secondary | ICD-10-CM | POA: Diagnosis not present

## 2020-10-13 DIAGNOSIS — M5459 Other low back pain: Secondary | ICD-10-CM | POA: Diagnosis not present

## 2020-10-25 ENCOUNTER — Encounter: Payer: Self-pay | Admitting: Intensive Care

## 2020-10-25 ENCOUNTER — Other Ambulatory Visit: Payer: Self-pay

## 2020-10-25 ENCOUNTER — Emergency Department
Admission: EM | Admit: 2020-10-25 | Discharge: 2020-10-25 | Disposition: A | Discharge status: Home / Self Care | Payer: BC Managed Care – PPO | Attending: Emergency Medicine | Admitting: Emergency Medicine

## 2020-10-25 DIAGNOSIS — M5440 Lumbago with sciatica, unspecified side: Secondary | ICD-10-CM

## 2020-10-25 DIAGNOSIS — F17210 Nicotine dependence, cigarettes, uncomplicated: Secondary | ICD-10-CM | POA: Insufficient documentation

## 2020-10-25 DIAGNOSIS — M5432 Sciatica, left side: Secondary | ICD-10-CM | POA: Diagnosis not present

## 2020-10-25 DIAGNOSIS — M545 Low back pain, unspecified: Secondary | ICD-10-CM | POA: Diagnosis not present

## 2020-10-25 DIAGNOSIS — N189 Chronic kidney disease, unspecified: Secondary | ICD-10-CM | POA: Diagnosis not present

## 2020-10-25 DIAGNOSIS — M5442 Lumbago with sciatica, left side: Secondary | ICD-10-CM | POA: Diagnosis not present

## 2020-10-25 MED ORDER — ORPHENADRINE CITRATE ER 100 MG PO TB12: 100.0000 mg | ORAL_TABLET | Freq: Two times a day (BID) | ORAL | 0 refills | Status: DC

## 2020-10-25 MED ORDER — OXYCODONE-ACETAMINOPHEN 7.5-325 MG PO TABS: 1.0000 | ORAL_TABLET | ORAL | 0 refills | Status: DC | PRN

## 2020-10-25 MED ORDER — LIDOCAINE 5 % EX PTCH
1.0000 | MEDICATED_PATCH | CUTANEOUS | Status: DC
  Administered 2020-10-25: 1 via TRANSDERMAL
  Filled 2020-10-25: qty 1

## 2020-10-25 MED ORDER — NAPROXEN 500 MG PO TABS: 500.0000 mg | ORAL_TABLET | Freq: Two times a day (BID) | ORAL | 0 refills | Status: DC

## 2020-10-25 NOTE — ED Notes (Signed)
See triage note. Lower back pain. Hard to walk around. Able to get from bed to wheelchair.

## 2020-10-25 NOTE — Discharge Instructions (Signed)
Follow-up with treating orthopedics to [facility] for definitive evaluation and treatment.

## 2020-10-25 NOTE — ED Provider Notes (Signed)
[facility] Emergency Department Provider Note   ____________________________________________   Event Date/Time   First MD Initiated Contact with Patient 10/25/20 1358     (approximate)  I have reviewed the triage vital signs and the nursing notes.   HISTORY  Chief Complaint Back Pain    HPI Judy Conner is a 35 y.o. female patient presents with 2 days of increasing low back pain with radicular component to the left lower extremity.  Patient has a history of chronic back pain which flares up intermittently for activities.  Patient was diagnosed with space narrowing between L4 and L5.  Patient denies bladder or bowel dysfunction.  Patient denies cauda equina complaints.  Patient is pending prior authorization for epidural injection through [facility]  Patient rates pain as a 9/10.  Patient described pain as "achy".         Past Medical History:  Diagnosis Date  . Anemia    hx low iron   . Chronic kidney disease    hx uti none currently  . Dysmenorrhea, unspecified 12/12/2019  . GERD (gastroesophageal reflux disease)   . Headache(784.0)   . History of umbilical hernia 9/62/9528  . Low serum potassium   . Sciatica   . Urticaria     Patient Active Problem List   Diagnosis Date Noted  . Thiamine deficiency 08/20/2020  . Elevated LFTs 08/15/2020  . Deficiency anemia 08/15/2020  . Vitamin B12 deficiency anemia due to intrinsic factor deficiency 08/15/2020  . Oral candidiasis 08/15/2020  . Iron deficiency 08/15/2020  . Hypokalemia 06/30/2020  . Vitamin D deficiency 06/30/2020  . B12 deficiency 06/30/2020  . Acute cystitis without hematuria 12/12/2019  . Dysmenorrhea, unspecified 12/12/2019  . History of umbilical hernia 41/32/4401  . Low back pain 12/11/2019  . Gastroesophageal reflux disease 12/11/2019    Past Surgical History:  Procedure Laterality Date  . CESAREAN SECTION  [phone]   x 3  . FINGER FRACTURE SURGERY Left     pinky    Prior to Admission medications   Medication Sig Start Date End Date Taking? Authorizing Provider  naproxen (NAPROSYN) 500 MG tablet Take 1 tablet (500 mg total) by mouth 2 (two) times daily with a meal. 10/25/20  Yes Sable Feil, PA-C  orphenadrine (NORFLEX) 100 MG tablet Take 1 tablet (100 mg total) by mouth 2 (two) times daily. 10/25/20  Yes Sable Feil, PA-C  oxyCODONE-acetaminophen (PERCOCET) 7.5-325 MG tablet Take 1 tablet by mouth every 4 (four) hours as needed for severe pain. 10/25/20  Yes Sable Feil, PA-C  famotidine (PEPCID) 20 MG tablet Take 1 tablet (20 mg total) by mouth 2 (two) times daily. 10/31/16   Carrie Mew, MD  ferrous sulfate 220 (44 Fe) MG/5ML solution Take 5 mLs (220 mg total) by mouth daily. Patient taking differently: Take 220 mg by mouth as needed. 08/15/20   Janith Lima, MD  pantoprazole (PROTONIX) 40 MG tablet Take 1 tablet (40 mg total) by mouth daily. 08/26/20   Ladene Artist, MD  potassium chloride SA (KLOR-CON) 20 MEQ tablet Take 1 tablet (20 mEq total) by mouth 3 (three) times daily. 08/15/20   Janith Lima, MD  promethazine (PHENERGAN) 25 MG tablet Take 25 mg by mouth as needed for nausea or vomiting.    [provider]  thiamine (VITAMIN B-1) 50 MG tablet Take 1 tablet (50 mg total) by mouth daily. Patient not taking: Reported on 08/26/2020 08/20/20   Janith Lima, MD  Allergies Fruit & vegetable daily [nutritional supplements] and Fentanyl  Family History  Problem Relation Age of Onset  . Breast cancer Maternal Grandmother   . Diabetes Maternal Grandmother   . Clotting disorder Paternal Uncle   . Heart disease Father   . Hypertension Father   . Crohn's disease Father   . Hypertension Sister     Social History Social History   Tobacco Use  . Smoking status: Current Some Day Smoker    Packs/day: 0.25    Years: 4.00    Pack years: 1.00    Types: Cigarettes  . Smokeless tobacco: Never Used  Vaping Use   . Vaping Use: Never used  Substance Use Topics  . Alcohol use: Yes    Comment: occasional  . Drug use: No    Review of Systems Constitutional: No fever/chills Eyes: No visual changes. ENT: No sore throat. Cardiovascular: Denies chest pain. Respiratory: Denies shortness of breath. Gastrointestinal: No abdominal pain.  No nausea, no vomiting.  No diarrhea.  No constipation. Genitourinary: Negative for dysuria. Musculoskeletal: Positive for back pain. Skin: Negative for rash. Neurological: Negative for headaches, focal weakness or numbness. Allergic/Immunilogical: Fentanyl ____________________________________________   PHYSICAL EXAM:  VITAL SIGNS: ED Triage Vitals  Enc Vitals Group     BP 10/25/20 1344 (!) 125/100     Pulse Rate 10/25/20 1344 (!) 109     Resp 10/25/20 1344 18     Temp 10/25/20 1344 98.4 F (36.9 C)     Temp Source 10/25/20 1344 Oral     SpO2 10/25/20 1344 96 %     Weight 10/25/20 1338 215 lb (97.5 kg)     Height 10/25/20 1338 5' 5.25" (1.657 m)     Head Circumference --      Peak Flow --      Pain Score 10/25/20 1338 9     Pain Loc --      Pain Edu? --      Excl. in [place]? --     Constitutional: Alert and oriented. Well appearing and in no acute distress. Neck: No stridor.  No cervical spine tenderness topalpation. Cardiovascular: Normal rate, regular rhythm. Grossly normal heart sounds.  Good peripheral circulation.  Elevated diastolic blood pressure Respiratory: Normal respiratory effort.  No retractions. Lungs CTAB. Gastrointestinal: Soft and nontender. No distention. No abdominal bruits. No CVA tenderness. Genitourinary: Deferred Musculoskeletal: No obvious lumbar spine deformity.  Patient is moderate guarding palpation L3-L5.  No lower extremity tenderness nor edema.  No joint effusions. Neurologic:  Normal speech and language. No gross focal neurologic deficits are appreciated. No gait instability. Skin:  Skin is warm, dry and intact. No rash  noted. Psychiatric: Mood and affect are normal. Speech and behavior are normal.  ____________________________________________   LABS (all labs ordered are listed, but only abnormal results are displayed)  Labs Reviewed - No data to display ____________________________________________  EKG   ____________________________________________  RADIOLOGY I, Sable Feil, personally viewed and evaluated these images (plain radiographs) as part of my medical decision making, as well as reviewing the written report by the radiologist.  ED MD interpretation:    Official radiology report(s): No results found.  ____________________________________________   PROCEDURES  Procedure(s) performed (including Critical Care):  Procedures   ____________________________________________   INITIAL IMPRESSION / ASSESSMENT AND PLAN / ED COURSE  As part of my medical decision making, I reviewed the following data within the [facility]         Patient presents with  low back pain which is increased past 2 days.  Patient has history of degenerative disc disease.  Patient given discharge care instructions and advised to follow-up with [facility] for definitive evaluation and treatment.  Advised on drug effects of the muscle relaxers and pain medication.      ____________________________________________   FINAL CLINICAL IMPRESSION(S) / ED DIAGNOSES  Final diagnoses:  Acute left-sided low back pain with sciatica, sciatica laterality unspecified     ED Discharge Orders         Ordered    orphenadrine (NORFLEX) 100 MG tablet  2 times daily        10/25/20 1415    naproxen (NAPROSYN) 500 MG tablet  2 times daily with meals        10/25/20 1415    oxyCODONE-acetaminophen (PERCOCET) 7.5-325 MG tablet  Every 4 hours PRN        10/25/20 1415          *Please note:  Judy Conner was evaluated in Emergency Department on 10/25/2020 for the symptoms described in the  history of present illness. She was evaluated in the context of the global COVID-19 pandemic, which necessitated consideration that the patient might be at risk for infection with the SARS-CoV-2 virus that causes COVID-19. Institutional protocols and algorithms that pertain to the evaluation of patients at risk for COVID-19 are in a state of rapid change based on information released by regulatory bodies including the CDC and federal and state organizations. These policies and algorithms were followed during the patient's care in the ED.  Some ED evaluations and interventions may be delayed as a result of limited staffing during and the pandemic.*   Note:  This document was prepared using Dragon voice recognition software and may include unintentional dictation errors.    Sable Feil, PA-C 10/25/20 1431    Lavonia Drafts, MD 10/25/20 [phone]
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
## 2020-10-28 LAB — HM PAP SMEAR

## 2020-11-13 ENCOUNTER — Ambulatory Visit: Payer: BC Managed Care – PPO | Admitting: Allergy

## 2020-11-13 ENCOUNTER — Telehealth: Payer: Self-pay

## 2020-11-13 DIAGNOSIS — J309 Allergic rhinitis, unspecified: Secondary | ICD-10-CM

## 2020-11-13 NOTE — Telephone Encounter (Signed)
Called and rescheduled patient's appointment. Patient expressed that she has a herniated slipped disc in her back and it's hard for her to move around. Patient rescheduled for late May.

## 2020-11-25 DIAGNOSIS — M5416 Radiculopathy, lumbar region: Secondary | ICD-10-CM | POA: Diagnosis not present

## 2020-12-05 ENCOUNTER — Ambulatory Visit: Payer: BC Managed Care – PPO | Admitting: Allergy

## 2020-12-05 DIAGNOSIS — J309 Allergic rhinitis, unspecified: Secondary | ICD-10-CM

## 2020-12-09 DIAGNOSIS — M545 Low back pain, unspecified: Secondary | ICD-10-CM | POA: Diagnosis not present

## 2020-12-26 DIAGNOSIS — M545 Low back pain, unspecified: Secondary | ICD-10-CM | POA: Diagnosis not present

## 2020-12-28 ENCOUNTER — Encounter: Payer: Self-pay | Admitting: Emergency Medicine

## 2020-12-28 ENCOUNTER — Emergency Department
Admission: EM | Admit: 2020-12-28 | Discharge: 2020-12-28 | Disposition: A | Discharge status: Home / Self Care | Payer: BC Managed Care – PPO | Attending: Emergency Medicine | Admitting: Emergency Medicine

## 2020-12-28 ENCOUNTER — Other Ambulatory Visit: Payer: Self-pay

## 2020-12-28 DIAGNOSIS — F17210 Nicotine dependence, cigarettes, uncomplicated: Secondary | ICD-10-CM | POA: Diagnosis not present

## 2020-12-28 DIAGNOSIS — L0231 Cutaneous abscess of buttock: Secondary | ICD-10-CM | POA: Diagnosis not present

## 2020-12-28 MED ORDER — SULFAMETHOXAZOLE-TRIMETHOPRIM 800-160 MG PO TABS
1.0000 | ORAL_TABLET | Freq: Once | ORAL | Status: AC
  Administered 2020-12-28: 1 via ORAL
  Filled 2020-12-28: qty 1

## 2020-12-28 MED ORDER — CEPHALEXIN 500 MG PO CAPS: 1000.0000 mg | ORAL_CAPSULE | Freq: Once | ORAL | Status: DC

## 2020-12-28 MED ORDER — SULFAMETHOXAZOLE-TRIMETHOPRIM 800-160 MG PO TABS: 1.0000 | ORAL_TABLET | Freq: Two times a day (BID) | ORAL | 0 refills | Status: AC

## 2020-12-28 MED ORDER — CEPHALEXIN 500 MG PO CAPS
1000.0000 mg | ORAL_CAPSULE | Freq: Once | ORAL | Status: AC
  Administered 2020-12-28: 1000 mg via ORAL
  Filled 2020-12-28: qty 2

## 2020-12-28 MED ORDER — CEPHALEXIN 500 MG PO CAPS: 500.0000 mg | ORAL_CAPSULE | Freq: Four times a day (QID) | ORAL | 0 refills | Status: AC

## 2020-12-28 MED ORDER — LIDOCAINE-EPINEPHRINE (PF) 2 %-1:200000 IJ SOLN
5.0000 mL | Freq: Once | INTRAMUSCULAR | Status: AC
  Administered 2020-12-28: 5 mL via INTRADERMAL
  Filled 2020-12-28: qty 20

## 2020-12-28 MED ORDER — MORPHINE SULFATE (PF) 2 MG/ML IV SOLN
2.0000 mg | Freq: Once | INTRAVENOUS | Status: AC
  Administered 2020-12-28: 2 mg via INTRAMUSCULAR
  Filled 2020-12-28: qty 1

## 2020-12-28 MED ORDER — FLUCONAZOLE 150 MG PO TABS: 150.0000 mg | ORAL_TABLET | Freq: Every day | ORAL | 0 refills | Status: DC

## 2020-12-28 MED ORDER — SULFAMETHOXAZOLE-TRIMETHOPRIM 800-160 MG PO TABS: 1.0000 | ORAL_TABLET | Freq: Once | ORAL | Status: DC

## 2020-12-28 NOTE — ED Notes (Signed)
See triage note  Presents with possible abscess area to right buttock area  States she notice the area couple of days ago  Area is now larger and having increased pain

## 2020-12-28 NOTE — Discharge Instructions (Addendum)
Please take antibiotics as prescribed. Take Tylenol up to 1000mg  4x daily and Ibuprofen 600mg  up to 3x daily as needed for pain. Return to the ER for any worsening, fevers or chills. Otherwise, follow up with primary care.

## 2020-12-28 NOTE — ED Provider Notes (Signed)
[facility] Emergency Department Provider Note  ____________________________________________   Event Date/Time   First MD Initiated Contact with Patient 12/28/20 [phone]     (approximate)  I have reviewed the triage vital signs and the nursing notes.   HISTORY  Chief Complaint Abscess  HPI Judy Conner is a 35 y.o. female who reports to the emergency department for evaluation of abscess to the right buttock.  She states that it has been increasing in size over the last week.  She attempted expressing material from it on her own a few days ago without any significant success.  She reports pain is increasing, worse with sitting down, attempting to use the bathroom.  She denies any fever, chills or other systemic symptoms.        Past Medical History:  Diagnosis Date   Anemia    hx low iron    Chronic kidney disease    hx uti none currently   Dysmenorrhea, unspecified 12/12/2019   GERD (gastroesophageal reflux disease)    Headache(784.0)    History of umbilical hernia 10/09/5571   Low serum potassium    Sciatica    Urticaria     Patient Active Problem List   Diagnosis Date Noted   Thiamine deficiency 08/20/2020   Elevated LFTs 08/15/2020   Deficiency anemia 08/15/2020   Vitamin B12 deficiency anemia due to intrinsic factor deficiency 08/15/2020   Oral candidiasis 08/15/2020   Iron deficiency 08/15/2020   Hypokalemia 06/30/2020   Vitamin D deficiency 06/30/2020   B12 deficiency 06/30/2020   Acute cystitis without hematuria 12/12/2019   Dysmenorrhea, unspecified 12/12/2019   History of umbilical hernia 22/08/5425   Low back pain 12/11/2019   Gastroesophageal reflux disease 12/11/2019    Past Surgical History:  Procedure Laterality Date   CESAREAN SECTION  [phone]   x 3   FINGER FRACTURE SURGERY Left    pinky    Prior to Admission medications   Medication Sig Start Date End Date Taking? Authorizing Provider  cephALEXin  (KEFLEX) 500 MG capsule Take 1 capsule (500 mg total) by mouth 4 (four) times daily for 10 days. 12/28/20 01/07/21 Yes Gautham Hewins, Farrel Gordon, PA  fluconazole (DIFLUCAN) 150 MG tablet Take 1 tablet (150 mg total) by mouth daily. Take 1 tablet now, and then repeat dose in 72 hours if symptoms persist. 12/28/20  Yes Jasmyn Picha, Farrel Gordon, PA  sulfamethoxazole-trimethoprim (BACTRIM DS) 800-160 MG tablet Take 1 tablet by mouth 2 (two) times daily for 10 days. 12/28/20 01/07/21 Yes Verlie Hellenbrand, Farrel Gordon, PA  famotidine (PEPCID) 20 MG tablet Take 1 tablet (20 mg total) by mouth 2 (two) times daily. 10/31/16   Carrie Mew, MD  ferrous sulfate 220 (44 Fe) MG/5ML solution Take 5 mLs (220 mg total) by mouth daily. Patient taking differently: Take 220 mg by mouth as needed. 08/15/20   Janith Lima, MD  pantoprazole (PROTONIX) 40 MG tablet Take 1 tablet (40 mg total) by mouth daily. 08/26/20   Ladene Artist, MD  potassium chloride SA (KLOR-CON) 20 MEQ tablet Take 1 tablet (20 mEq total) by mouth 3 (three) times daily. 08/15/20   Janith Lima, MD  promethazine (PHENERGAN) 25 MG tablet Take 25 mg by mouth as needed for nausea or vomiting.    [provider]    Allergies Fruit & vegetable daily [nutritional supplements] and Fentanyl  Family History  Problem Relation Age of Onset   Breast cancer Maternal Grandmother    Diabetes Maternal Grandmother  Clotting disorder Paternal Uncle    Heart disease Father    Hypertension Father    Crohn's disease Father    Hypertension Sister     Social History Social History   Tobacco Use   Smoking status: Some Days    Packs/day: 0.25    Years: 4.00    Pack years: 1.00    Types: Cigarettes   Smokeless tobacco: Never  Vaping Use   Vaping Use: Never used  Substance Use Topics   Alcohol use: Yes    Comment: occasional   Drug use: No    Review of Systems Constitutional: No fever/chills Eyes: No visual changes. ENT: No sore throat. Cardiovascular:  Denies chest pain. Respiratory: Denies shortness of breath. Gastrointestinal: No abdominal pain.  No nausea, no vomiting.  No diarrhea.  No constipation. Genitourinary: Negative for dysuria. Musculoskeletal: Negative for back pain. Skin:+ Abscess to right buttock Neurological: Negative for headaches, focal weakness or numbness.   ____________________________________________   PHYSICAL EXAM:  VITAL SIGNS: ED Triage Vitals  Enc Vitals Group     BP 12/28/20 0833 125/73     Pulse Rate 12/28/20 0833 97     Resp 12/28/20 0833 20     Temp 12/28/20 0833 97.8 F (36.6 C)     Temp Source 12/28/20 0833 Oral     SpO2 12/28/20 0833 97 %     Weight 12/28/20 0832 215 lb (97.5 kg)     Height 12/28/20 0832 5\' 5"  (1.651 m)     Head Circumference --      Peak Flow --      Pain Score 12/28/20 0832 9     Pain Loc --      Pain Edu? --      Excl. in [place]? --    Constitutional: Alert and oriented. Well appearing and in no acute distress. Eyes: Conjunctivae are normal. PERRL. EOMI. Head: Atraumatic. Nose: No congestion/rhinnorhea. Mouth/Throat: Mucous membranes are moist.  Oropharynx non-erythematous. Neck: No stridor.   Cardiovascular: Normal rate, regular rhythm. Grossly normal heart sounds.  Good peripheral circulation. Respiratory: Normal respiratory effort.  No retractions. Lungs CTAB. Gastrointestinal: Soft and nontender. No distention. No abdominal bruits. No CVA tenderness. Musculoskeletal: No lower extremity tenderness nor edema.  No joint effusions. Neurologic:  Normal speech and language. No gross focal neurologic deficits are appreciated. No gait instability. Skin: There is a 2 cm x 2 cm raised fluctuant area on the right buttock, approximately 3 cm lateral to the rectum.  There is surrounding induration and erythema, but does not extend to the rectum. Psychiatric: Mood and affect are normal. Speech and behavior are  normal.  ____________________________________________   PROCEDURES  Procedure(s) performed (including Critical Care):  Marland KitchenMarland KitchenIncision and Drainage  Date/Time: 12/28/2020 10:57 AM Performed by: Marlana Salvage, PA Authorized by: Marlana Salvage, PA   Consent:    Consent obtained:  Verbal   Consent given by:  Patient   Risks, benefits, and alternatives were discussed: yes     Risks discussed:  Bleeding, incomplete drainage, pain and infection   Alternatives discussed:  No treatment and delayed treatment Universal protocol:    Procedure explained and questions answered to patient or proxy's satisfaction: yes     Patient identity confirmed:  Verbally with patient Location:    Type:  Abscess   Location:  Lower extremity   Lower extremity location:  Buttock   Buttock location:  R buttock Pre-procedure details:    Skin preparation:  Povidone-iodine Sedation:  Sedation type:  None Anesthesia:    Anesthesia method:  Topical application Procedure type:    Complexity:  Simple Procedure details:    Incision types:  Stab incision   Wound management:  Probed and deloculated   Drainage:  Purulent and bloody   Drainage amount:  Moderate   Wound treatment:  Wound left open   Packing materials:  None Post-procedure details:    Procedure completion:  Tolerated well, no immediate complications   ____________________________________________   INITIAL IMPRESSION / ASSESSMENT AND PLAN / ED COURSE  As part of my medical decision making, I reviewed the following data within the [facility] notes reviewed and incorporated and Notes from prior ED visits        Patient is a 35 year old female who presents to the emergency department for evaluation of abscess to the right buttock.  See HPI for further details.  In triage patient has normal vital signs.  On physical exam there is a 2 cm x 2 cm raised area consistent with abscess.  Given that this has been  present for greater than 1 week without any spontaneous drainage, offered I&D to the patient which she accepted.  Risk and benefits were discussed prior to procedure, see procedure note.  Patient was also initiated on Keflex and Bactrim and per her request was given Rx for Diflucan given history of yeast infections with antibiotics.  Return precautions were discussed, patient is stable at this time for outpatient management.      ____________________________________________   FINAL CLINICAL IMPRESSION(S) / ED DIAGNOSES  Final diagnoses:  Abscess of buttock, right     ED Discharge Orders          Ordered    cephALEXin (KEFLEX) 500 MG capsule  4 times daily        12/28/20 0943    sulfamethoxazole-trimethoprim (BACTRIM DS) 800-160 MG tablet  2 times daily        12/28/20 0943    fluconazole (DIFLUCAN) 150 MG tablet  Daily        12/28/20 1100             Note:  This document was prepared using Dragon voice recognition software and may include unintentional dictation errors.    Marlana Salvage, PA 12/28/20 1104    Harvest Dark, MD 12/28/20 1311

## 2020-12-28 NOTE — ED Triage Notes (Signed)
Pt reports abscess to right buttocks for the past week. Pt reports attempted to drain it but thinks she made it worse.

## 2020-12-31 DIAGNOSIS — M5416 Radiculopathy, lumbar region: Secondary | ICD-10-CM | POA: Diagnosis not present

## 2021-01-06 DIAGNOSIS — M5416 Radiculopathy, lumbar region: Secondary | ICD-10-CM | POA: Diagnosis not present

## 2021-01-14 DIAGNOSIS — M5416 Radiculopathy, lumbar region: Secondary | ICD-10-CM | POA: Diagnosis not present

## 2021-01-22 DIAGNOSIS — M5416 Radiculopathy, lumbar region: Secondary | ICD-10-CM | POA: Diagnosis not present

## 2021-01-29 ENCOUNTER — Telehealth: Payer: Self-pay | Admitting: Internal Medicine

## 2021-01-29 NOTE — Telephone Encounter (Signed)
   Patient is requesting an appointment for a med refill and thrush. She is also wanting lab work for b12. PCP first available is 8/15

## 2021-01-30 ENCOUNTER — Other Ambulatory Visit: Payer: Self-pay

## 2021-01-30 ENCOUNTER — Encounter: Payer: Self-pay | Admitting: Internal Medicine

## 2021-01-30 ENCOUNTER — Ambulatory Visit: Payer: BC Managed Care – PPO | Admitting: Internal Medicine

## 2021-01-30 VITALS — BP 118/70 | HR 78 | Temp 98.0°F | Resp 16 | Ht 65.0 in | Wt 210.0 lb

## 2021-01-30 DIAGNOSIS — B370 Candidal stomatitis: Secondary | ICD-10-CM

## 2021-01-30 DIAGNOSIS — D510 Vitamin B12 deficiency anemia due to intrinsic factor deficiency: Secondary | ICD-10-CM

## 2021-01-30 DIAGNOSIS — D520 Dietary folate deficiency anemia: Secondary | ICD-10-CM | POA: Insufficient documentation

## 2021-01-30 DIAGNOSIS — K219 Gastro-esophageal reflux disease without esophagitis: Secondary | ICD-10-CM | POA: Diagnosis not present

## 2021-01-30 DIAGNOSIS — E876 Hypokalemia: Secondary | ICD-10-CM | POA: Diagnosis not present

## 2021-01-30 DIAGNOSIS — E519 Thiamine deficiency, unspecified: Secondary | ICD-10-CM

## 2021-01-30 LAB — CBC WITH DIFFERENTIAL/PLATELET
Basophils Absolute: 0 10*3/uL (ref 0.0–0.1)
Basophils Relative: 0.6 % (ref 0.0–3.0)
Eosinophils Absolute: 0.4 10*3/uL (ref 0.0–0.7)
Eosinophils Relative: 4.7 % (ref 0.0–5.0)
HCT: 35.6 % — ABNORMAL LOW (ref 36.0–46.0)
Hemoglobin: 11.7 g/dL — ABNORMAL LOW (ref 12.0–15.0)
Lymphocytes Relative: 41.3 % (ref 12.0–46.0)
Lymphs Abs: 3.3 10*3/uL (ref 0.7–4.0)
MCHC: 32.8 g/dL (ref 30.0–36.0)
MCV: 89.8 fl (ref 78.0–100.0)
Monocytes Absolute: 0.6 10*3/uL (ref 0.1–1.0)
Monocytes Relative: 7.3 % (ref 3.0–12.0)
Neutro Abs: 3.7 10*3/uL (ref 1.4–7.7)
Neutrophils Relative %: 46.1 % (ref 43.0–77.0)
Platelets: 318 10*3/uL (ref 150.0–400.0)
RBC: 3.96 Mil/uL (ref 3.87–5.11)
RDW: 17.5 % — ABNORMAL HIGH (ref 11.5–15.5)
WBC: 8.1 10*3/uL (ref 4.0–10.5)

## 2021-01-30 LAB — BASIC METABOLIC PANEL
BUN: 8 mg/dL (ref 6–23)
CO2: 26 mEq/L (ref 19–32)
Calcium: 8.7 mg/dL (ref 8.4–10.5)
Chloride: 107 mEq/L (ref 96–112)
Creatinine, Ser: 0.86 mg/dL (ref 0.40–1.20)
GFR: 88.06 mL/min (ref >60.00)
Glucose, Bld: 84 mg/dL (ref 70–99)
Potassium: 3.8 mEq/L (ref 3.5–5.1)
Sodium: 140 mEq/L (ref 135–145)

## 2021-01-30 LAB — FOLATE: Folate: 5.1 ng/mL — ABNORMAL LOW (ref >5.9)

## 2021-01-30 LAB — HCG, QUANTITATIVE, PREGNANCY: Quantitative HCG: 0.92 m[IU]/mL

## 2021-01-30 LAB — MAGNESIUM: Magnesium: 1.9 mg/dL (ref 1.5–2.5)

## 2021-01-30 LAB — HEMOGLOBIN A1C: Hgb A1c MFr Bld: 6 % (ref 4.6–6.5)

## 2021-01-30 MED ORDER — FOLIC ACID 1 MG PO TABS
1.0000 mg | ORAL_TABLET | Freq: Every day | ORAL | 1 refills | Status: DC
Start: 2021-01-30 — End: 2021-10-21

## 2021-01-30 MED ORDER — FLUCONAZOLE 150 MG PO TABS: 150.0000 mg | ORAL_TABLET | Freq: Every day | ORAL | 0 refills | Status: AC

## 2021-01-30 MED ORDER — POTASSIUM CHLORIDE CRYS ER 20 MEQ PO TBCR: 20.0000 meq | EXTENDED_RELEASE_TABLET | Freq: Three times a day (TID) | ORAL | 0 refills | Status: DC

## 2021-01-30 MED ORDER — PROMETHAZINE HCL 25 MG PO TABS: 25.0000 mg | ORAL_TABLET | Freq: Three times a day (TID) | ORAL | 1 refills | Status: DC | PRN

## 2021-01-30 MED ORDER — CYANOCOBALAMIN 1000 MCG/ML IJ SOLN
1000.0000 ug | Freq: Once | INTRAMUSCULAR | Status: AC
  Administered 2021-01-30: 1000 ug via INTRAMUSCULAR

## 2021-01-30 NOTE — Progress Notes (Signed)
Folate    Subjective:  Patient ID: Judy Conner, female    DOB: 11-13-1985  Age: 35 y.o. MRN: [id number]  CC: Anemia  This visit occurred during the SARS-CoV-2 public health emergency.  Safety protocols were in place, including screening questions prior to the visit, additional usage of staff PPE, and extensive cleaning of exam room while observing appropriate contact time as indicated for disinfecting solutions.    HPI Judy Conner presents for f/up -   She recently took an antibiotic for a skin infection and has been struggling with oral yeast since then.  She complains of a painful coating on her tongue.  She has tried nystatin suspension without much relief.  She has rare nausea from GERD but denies odynophagia or dysphagia.  Outpatient Medications Prior to Visit  Medication Sig Dispense Refill   famotidine (PEPCID) 20 MG tablet Take 1 tablet (20 mg total) by mouth 2 (two) times daily. 60 tablet 0   ferrous sulfate 220 (44 Fe) MG/5ML solution Take 5 mLs (220 mg total) by mouth daily. (Patient taking differently: Take 220 mg by mouth as needed.) 473 mL 1   pantoprazole (PROTONIX) 40 MG tablet Take 1 tablet (40 mg total) by mouth daily. 30 tablet 11   fluconazole (DIFLUCAN) 150 MG tablet Take 1 tablet (150 mg total) by mouth daily. Take 1 tablet now, and then repeat dose in 72 hours if symptoms persist. 2 tablet 0   potassium chloride SA (KLOR-CON) 20 MEQ tablet Take 1 tablet (20 mEq total) by mouth 3 (three) times daily. 270 each 0   promethazine (PHENERGAN) 25 MG tablet Take 25 mg by mouth as needed for nausea or vomiting.     No facility-administered medications prior to visit.    ROS Review of Systems  Constitutional:  Negative for chills, fatigue, fever and unexpected weight change.  HENT: Negative.  Negative for trouble swallowing.   Eyes: Negative.   Respiratory:  Negative for cough, chest tightness, shortness of breath and wheezing.   Cardiovascular:  Negative for  chest pain, palpitations and leg swelling.  Gastrointestinal:  Positive for nausea. Negative for abdominal pain.  Endocrine: Negative.   Genitourinary: Negative.  Negative for difficulty urinating.  Musculoskeletal:  Negative for arthralgias, back pain, myalgias and neck pain.  Skin: Negative.  Negative for color change and pallor.  Neurological:  Negative for dizziness, weakness, light-headedness, numbness and headaches.  Hematological:  Negative for adenopathy. Does not bruise/bleed easily.  Psychiatric/Behavioral: Negative.     Objective:  BP 118/70 (BP Location: Right Arm, Patient Position: Sitting, Cuff Size: Large)   Pulse 78   Temp 98 F (36.7 C) (Oral)   Resp 16   Ht 5\' 5"  (1.651 m)   Wt 210 lb (95.3 kg)   LMP 01/29/2021 (Exact Date)   SpO2 98%   BMI 34.95 kg/m   BP Readings from Last 3 Encounters:  01/30/21 118/70  12/28/20 125/73  10/25/20 (!) 125/100    Wt Readings from Last 3 Encounters:  01/30/21 210 lb (95.3 kg)  12/28/20 215 lb (97.5 kg)  10/25/20 215 lb (97.5 kg)    Physical Exam Vitals reviewed.  HENT:     Nose: Nose normal.     Mouth/Throat:     Lips: Pink.     Mouth: Mucous membranes are moist.     Tongue: Lesions present.     Pharynx: Oropharynx is clear.     Tonsils: No tonsillar exudate.     Comments: There is a thick  white coating on the top of her tongue Eyes:     General: No scleral icterus.    Conjunctiva/sclera: Conjunctivae normal.  Cardiovascular:     Rate and Rhythm: Normal rate and regular rhythm.     Heart sounds: No murmur heard. Pulmonary:     Effort: Pulmonary effort is normal.     Breath sounds: No stridor. No wheezing, rhonchi or rales.  Abdominal:     General: Abdomen is flat.     Palpations: There is no mass.     Tenderness: There is no abdominal tenderness. There is no guarding.     Hernia: No hernia is present.  Musculoskeletal:        General: Normal range of motion.     Cervical back: Neck supple.     Right  lower leg: No edema.     Left lower leg: No edema.  Lymphadenopathy:     Cervical: No cervical adenopathy.  Skin:    General: Skin is warm and dry.  Neurological:     General: No focal deficit present.     Mental Status: She is alert.  Psychiatric:        Mood and Affect: Mood normal.        Behavior: Behavior normal.    Lab Results  Component Value Date   WBC 8.1 01/30/2021   HGB 11.7 (L) 01/30/2021   HCT 35.6 (L) 01/30/2021   PLT 318.0 01/30/2021   GLUCOSE 84 01/30/2021   CHOL 148 12/11/2019   TRIG 108.0 12/11/2019   HDL 30.40 (L) 12/11/2019   LDLCALC 96 12/11/2019   ALT 10 08/15/2020   AST 12 08/15/2020   NA 140 01/30/2021   K 3.8 01/30/2021   CL 107 01/30/2021   CREATININE 0.86 01/30/2021   BUN 8 01/30/2021   CO2 26 01/30/2021   TSH 1.01 12/11/2019   INR 1.1 (H) 08/15/2020   HGBA1C 6.0 01/30/2021    No results found.  Assessment & Plan:   Judy Conner was seen today for anemia.  Diagnoses and all orders for this visit:  Vitamin B12 deficiency anemia due to intrinsic factor deficiency- Will continue parenteral B12 replacement therapy. -     cyanocobalamin ((VITAMIN B-12)) injection 1,000 mcg -     CBC with Differential/Platelet; Future -     Folate; Future -     hCG, quantitative, pregnancy; Future -     hCG, quantitative, pregnancy -     Folate -     CBC with Differential/Platelet  Hypokalemia-her potassium level is normal now. -     potassium chloride SA (KLOR-CON) 20 MEQ tablet; Take 1 tablet (20 mEq total) by mouth 3 (three) times daily. -     Basic metabolic panel; Future -     Magnesium; Future -     hCG, quantitative, pregnancy; Future -     hCG, quantitative, pregnancy -     Magnesium -     Basic metabolic panel  Gastroesophageal reflux disease, unspecified whether esophagitis present -     hCG, quantitative, pregnancy; Future -     promethazine (PHENERGAN) 25 MG tablet; Take 1 tablet (25 mg total) by mouth every 8 (eight) hours as needed for  nausea or vomiting. -     hCG, quantitative, pregnancy  Oral candidiasis- Labs are negative for secondary causes.  Will treat with fluconazole. -     Hemoglobin A1c; Future -     HIV Antibody (routine testing w rflx); Future -  hCG, quantitative, pregnancy; Future -     fluconazole (DIFLUCAN) 150 MG tablet; Take 1 tablet (150 mg total) by mouth daily for 7 days. -     hCG, quantitative, pregnancy -     HIV Antibody (routine testing w rflx) -     Hemoglobin A1c  Thiamine deficiency- She is still anemic.  I will monitor her B1 level. -     CBC with Differential/Platelet; Future -     Vitamin B1; Future -     hCG, quantitative, pregnancy; Future -     hCG, quantitative, pregnancy -     Vitamin B1 -     CBC with Differential/Platelet  Dietary folate deficiency anemia -     folic acid (FOLVITE) 1 MG tablet; Take 1 tablet (1 mg total) by mouth daily.  I have changed Judy Conner's promethazine. I am also having her start on folic acid. Additionally, I am having her maintain her famotidine, ferrous sulfate, pantoprazole, and potassium chloride SA. We administered cyanocobalamin.  Meds ordered this encounter  Medications   potassium chloride SA (KLOR-CON) 20 MEQ tablet    Sig: Take 1 tablet (20 mEq total) by mouth 3 (three) times daily.    Dispense:  270 tablet    Refill:  0   cyanocobalamin ((VITAMIN B-12)) injection 1,000 mcg   fluconazole (DIFLUCAN) 150 MG tablet    Sig: Take 1 tablet (150 mg total) by mouth daily for 7 days.    Dispense:  7 tablet    Refill:  0   promethazine (PHENERGAN) 25 MG tablet    Sig: Take 1 tablet (25 mg total) by mouth every 8 (eight) hours as needed for nausea or vomiting.    Dispense:  30 tablet    Refill:  1   folic acid (FOLVITE) 1 MG tablet    Sig: Take 1 tablet (1 mg total) by mouth daily.    Dispense:  90 tablet    Refill:  1     Follow-up: Return in about 3 months (around 05/02/2021).  Judy Calico, MD

## 2021-01-30 NOTE — Telephone Encounter (Signed)
Pt scheduled today 7/15 @ 11am

## 2021-01-30 NOTE — Patient Instructions (Signed)
Oral Thrush, Adult Oral thrush, also called oral candidiasis, is a fungal infection that develops in the mouth and throat and on the tongue. It causes white patches to form inthe mouth and on the tongue. Many cases of thrush are mild, but this infection can also be serious. Judy Conner can be a repeated (recurrent) problem for certain people who have a weak body defense system (immune system). The weakness can be caused by chronic illnesses, or by taking medicines that limit the body's ability to fight infection. If a person has difficulty fighting infection, the fungus that causes thrush can spread through the body.This can cause life-threatening blood or organ infections. What are the causes? This condition is caused by a fungus (yeast) called Candida albicans. This fungus is normally present in small amounts in the mouth and on other mucous membranes. It usually causes no harm. If conditions are present that allow the fungus to grow without control, it invades surrounding tissues and becomes an infection. Other Candida species can also lead to thrush, though this is rare. What increases the risk? The following factors may make you more likely to develop this condition: Having a weakened immune system. Being an older adult. Having diabetes, cancer, or HIV (human immunodeficiency virus). Having dry mouth (xerostomia). Being pregnant or breastfeeding. Having poor dental care, especially in those who have dentures. Using antibiotic or steroid medicines. What are the signs or symptoms? Symptoms of this condition can vary from mild and moderate to severe and persistent. Symptoms may include: A burning feeling in the mouth and throat. This can occur at the start of a thrush infection. White patches that stick to the mouth and tongue. The tissue around the patches may be red, raw, and painful. If rubbed (during tooth brushing, for example), the patches and the tissue of the mouth may bleed easily. A bad  taste in the mouth or difficulty tasting foods. A cottony feeling in the mouth. Pain during eating and swallowing. Poor appetite. Cracking at the corners of the mouth. How is this diagnosed? This condition is diagnosed based on: A physical exam. Your medical history. How is this treated? This condition is treated with medicines called antifungals, which prevent the growth of fungi. These medicines are either applied directly to the affected area (topical) or swallowed (oral). The treatment will depend on the severity of the condition. Mild cases of thrush may be treated with an antifungal mouth rinse or lozenges. Treatment usually lasts about 14 days. Moderate to severe cases of thrush can be treated with oral antifungal medicine, if they have spread to the esophagus. A topical antifungal medicine may also be used. For some severe infections, treatment may need to continue for more than 14 days. Oral antifungal medicines are rarely used during pregnancy because they may be harmful to the unborn child. If you are pregnant, talk with your health care provider about options for treatment. Persistent or recurrent thrush. For cases of thrush that do not go away or keep coming back: Treatment may be needed twice as long as the symptoms last. Treatment will include both oral and topical antifungal medicines. People with a weakened immune system can take an antifungal medicine on a continuous basis to prevent thrush infections. It is important to treat conditions that make a person more likely to getthrush, such as diabetes or HIV. Follow these instructions at home: Medicines Take or use over-the-counter and prescription medicines only as told by your health care provider. Talk with your health care provider about  an over-the-counter medicine called gentian violet, which kills bacteria and fungi. Relieving soreness and discomfort To help reduce the discomfort of thrush: Drink cold liquids such as  water or iced tea. Try flavored ice treats or frozen juices. Eat foods that are easy to swallow, such as gelatin, ice cream, or custard. Try drinking from a straw if the patches in your mouth are painful.  General instructions Eat plain, unflavored yogurt as directed by your health care provider. Check the label to make sure the yogurt contains live cultures. This yogurt can help healthy bacteria grow in the mouth and can stop the growth of the fungus that causes thrush. If you wear dentures, remove the dentures before going to bed, brush them vigorously, and soak them in a cleaning solution as directed by your health care provider. Rinse your mouth with a warm salt-water mixture several times a day. To make a salt-water mixture, dissolve -1 tsp (3-6 g) of salt in 1 cup (237 mL) of warm water. Contact a health care provider if: Your symptoms are getting worse or are not improving within 7 days of starting treatment. You have symptoms of a spreading infection, such as white patches on the skin outside of the mouth. You are breastfeeding your baby and you have redness and pain in the nipples. Summary Oral thrush, also called oral candidiasis, is a fungal infection that develops in the mouth and throat and on the tongue. It causes white patches to form in the mouth and on the tongue. You are more likely to get this condition if you have a weakened immune system or an underlying condition, such as HIV, cancer, or diabetes. This condition is treated with medicines called antifungals, which prevent the growth of fungi. Contact a health care provider if your symptoms do not improve, or get worse, within 7 days of starting treatment. This information is not intended to replace advice given to you by your health care provider. Make sure you discuss any questions you have with your healthcare provider. Document Revised: 05/11/2019 Document Reviewed: 05/11/2019 Elsevier Patient Education  Judy Conner.

## 2021-02-09 DIAGNOSIS — M5416 Radiculopathy, lumbar region: Secondary | ICD-10-CM | POA: Diagnosis not present

## 2021-02-10 ENCOUNTER — Other Ambulatory Visit: Payer: Self-pay | Admitting: Internal Medicine

## 2021-02-10 DIAGNOSIS — E519 Thiamine deficiency, unspecified: Secondary | ICD-10-CM | POA: Insufficient documentation

## 2021-02-10 DIAGNOSIS — D538 Other specified nutritional anemias: Secondary | ICD-10-CM | POA: Insufficient documentation

## 2021-02-10 LAB — VITAMIN B1: Vitamin B1 (Thiamine): 7 nmol/L — ABNORMAL LOW (ref 8–30)

## 2021-02-10 LAB — HIV ANTIBODY (ROUTINE TESTING W REFLEX): HIV 1&2 Ab, 4th Generation: NONREACTIVE

## 2021-02-10 MED ORDER — THIAMINE HCL 100 MG PO TABS: 100.0000 mg | ORAL_TABLET | ORAL | 1 refills | Status: DC

## 2021-02-11 ENCOUNTER — Encounter: Payer: Self-pay | Admitting: Internal Medicine

## 2021-02-19 ENCOUNTER — Telehealth: Payer: Self-pay | Admitting: Internal Medicine

## 2021-02-19 DIAGNOSIS — B370 Candidal stomatitis: Secondary | ICD-10-CM

## 2021-03-04 DIAGNOSIS — Z20822 Contact with and (suspected) exposure to covid-19: Secondary | ICD-10-CM | POA: Diagnosis not present

## 2021-03-26 ENCOUNTER — Other Ambulatory Visit: Payer: Self-pay | Admitting: Specialist

## 2021-03-26 DIAGNOSIS — G8929 Other chronic pain: Secondary | ICD-10-CM

## 2021-03-26 DIAGNOSIS — M545 Low back pain, unspecified: Secondary | ICD-10-CM

## 2021-03-31 ENCOUNTER — Other Ambulatory Visit: Payer: Self-pay

## 2021-03-31 ENCOUNTER — Ambulatory Visit
Admission: RE | Admit: 2021-03-31 | Discharge: 2021-03-31 | Disposition: A | Discharge status: Home / Self Care | Payer: BC Managed Care – PPO | Source: Ambulatory Visit | Attending: Specialist | Admitting: Specialist

## 2021-03-31 DIAGNOSIS — G8929 Other chronic pain: Secondary | ICD-10-CM

## 2021-03-31 DIAGNOSIS — M5136 Other intervertebral disc degeneration, lumbar region: Secondary | ICD-10-CM | POA: Diagnosis not present

## 2021-03-31 DIAGNOSIS — M47817 Spondylosis without myelopathy or radiculopathy, lumbosacral region: Secondary | ICD-10-CM | POA: Diagnosis not present

## 2021-03-31 DIAGNOSIS — M545 Low back pain, unspecified: Secondary | ICD-10-CM

## 2021-03-31 MED ORDER — IOPAMIDOL (ISOVUE-M 200) INJECTION 41%
1.0000 mL | Freq: Once | INTRAMUSCULAR | Status: AC
  Administered 2021-03-31: 1 mL via EPIDURAL

## 2021-03-31 MED ORDER — METHYLPREDNISOLONE ACETATE 40 MG/ML INJ SUSP (RADIOLOG
80.0000 mg | Freq: Once | INTRAMUSCULAR | Status: AC
  Administered 2021-03-31: 80 mg via EPIDURAL

## 2021-03-31 NOTE — Discharge Instructions (Signed)

## 2021-04-02 ENCOUNTER — Other Ambulatory Visit: Payer: BC Managed Care – PPO

## 2021-04-10 NOTE — Telephone Encounter (Signed)
    Patient calling to request medication for thrush and requesting B12   Please advise

## 2021-04-13 ENCOUNTER — Other Ambulatory Visit: Payer: Self-pay | Admitting: Internal Medicine

## 2021-04-13 DIAGNOSIS — E538 Deficiency of other specified B group vitamins: Secondary | ICD-10-CM

## 2021-04-13 NOTE — Telephone Encounter (Signed)
Called pt, LVM stating an OV is needed.

## 2021-09-04 ENCOUNTER — Ambulatory Visit: Payer: Self-pay

## 2021-09-04 DIAGNOSIS — N390 Urinary tract infection, site not specified: Secondary | ICD-10-CM | POA: Diagnosis not present

## 2021-09-04 DIAGNOSIS — Z7251 High risk heterosexual behavior: Secondary | ICD-10-CM | POA: Diagnosis not present

## 2021-09-25 DIAGNOSIS — M545 Low back pain, unspecified: Secondary | ICD-10-CM | POA: Diagnosis not present

## 2021-09-25 DIAGNOSIS — G8929 Other chronic pain: Secondary | ICD-10-CM | POA: Diagnosis not present

## 2021-09-25 DIAGNOSIS — N898 Other specified noninflammatory disorders of vagina: Secondary | ICD-10-CM | POA: Diagnosis not present

## 2021-10-21 ENCOUNTER — Ambulatory Visit (INDEPENDENT_AMBULATORY_CARE_PROVIDER_SITE_OTHER): Payer: No Typology Code available for payment source | Admitting: Internal Medicine

## 2021-10-21 ENCOUNTER — Encounter: Payer: Self-pay | Admitting: Internal Medicine

## 2021-10-21 VITALS — BP 126/84 | HR 98 | Temp 98.6°F | Ht 65.0 in | Wt 225.0 lb

## 2021-10-21 DIAGNOSIS — R7303 Prediabetes: Secondary | ICD-10-CM | POA: Diagnosis not present

## 2021-10-21 DIAGNOSIS — D510 Vitamin B12 deficiency anemia due to intrinsic factor deficiency: Secondary | ICD-10-CM | POA: Diagnosis not present

## 2021-10-21 DIAGNOSIS — E519 Thiamine deficiency, unspecified: Secondary | ICD-10-CM | POA: Diagnosis not present

## 2021-10-21 DIAGNOSIS — D520 Dietary folate deficiency anemia: Secondary | ICD-10-CM

## 2021-10-21 DIAGNOSIS — E611 Iron deficiency: Secondary | ICD-10-CM | POA: Diagnosis not present

## 2021-10-21 DIAGNOSIS — D538 Other specified nutritional anemias: Secondary | ICD-10-CM

## 2021-10-21 DIAGNOSIS — K219 Gastro-esophageal reflux disease without esophagitis: Secondary | ICD-10-CM

## 2021-10-21 LAB — BASIC METABOLIC PANEL
BUN: 7 mg/dL (ref 6–23)
CO2: 26 mEq/L (ref 19–32)
Calcium: 9.4 mg/dL (ref 8.4–10.5)
Chloride: 105 mEq/L (ref 96–112)
Creatinine, Ser: 0.92 mg/dL (ref 0.40–1.20)
GFR: 80.81 mL/min (ref >60.00)
Glucose, Bld: 85 mg/dL (ref 70–99)
Potassium: 3.5 mEq/L (ref 3.5–5.1)
Sodium: 139 mEq/L (ref 135–145)

## 2021-10-21 LAB — CBC WITH DIFFERENTIAL/PLATELET
Basophils Absolute: 0 10*3/uL (ref 0.0–0.1)
Basophils Relative: 0.3 % (ref 0.0–3.0)
Eosinophils Absolute: 0.2 10*3/uL (ref 0.0–0.7)
Eosinophils Relative: 3.1 % (ref 0.0–5.0)
HCT: 37.6 % (ref 36.0–46.0)
Hemoglobin: 12.2 g/dL (ref 12.0–15.0)
Lymphocytes Relative: 37.2 % (ref 12.0–46.0)
Lymphs Abs: 3 10*3/uL (ref 0.7–4.0)
MCHC: 32.4 g/dL (ref 30.0–36.0)
MCV: 89.9 fl (ref 78.0–100.0)
Monocytes Absolute: 0.9 10*3/uL (ref 0.1–1.0)
Monocytes Relative: 10.9 % (ref 3.0–12.0)
Neutro Abs: 4 10*3/uL (ref 1.4–7.7)
Neutrophils Relative %: 48.5 % (ref 43.0–77.0)
Platelets: 324 10*3/uL (ref 150.0–400.0)
RBC: 4.18 Mil/uL (ref 3.87–5.11)
RDW: 17.3 % — ABNORMAL HIGH (ref 11.5–15.5)
WBC: 8.2 10*3/uL (ref 4.0–10.5)

## 2021-10-21 LAB — IBC + FERRITIN
Ferritin: 5.2 ng/mL — ABNORMAL LOW (ref 10.0–291.0)
Iron: 37 ug/dL — ABNORMAL LOW (ref 42–145)
Saturation Ratios: 8.7 % — ABNORMAL LOW (ref 20.0–50.0)
TIBC: 424.2 ug/dL (ref 250.0–450.0)
Transferrin: 303 mg/dL (ref 212.0–360.0)

## 2021-10-21 LAB — HEMOGLOBIN A1C: Hgb A1c MFr Bld: 6 % (ref 4.6–6.5)

## 2021-10-21 MED ORDER — PANTOPRAZOLE SODIUM 40 MG PO TBEC: 40.0000 mg | DELAYED_RELEASE_TABLET | Freq: Every day | ORAL | 1 refills | Status: DC

## 2021-10-21 MED ORDER — ACCRUFER 30 MG PO CAPS: 1.0000 | ORAL_CAPSULE | Freq: Two times a day (BID) | ORAL | 1 refills | Status: DC

## 2021-10-21 MED ORDER — CYANOCOBALAMIN 1000 MCG/ML IJ SOLN
1000.0000 ug | Freq: Once | INTRAMUSCULAR | Status: AC
  Administered 2021-10-21: 1000 ug via INTRAMUSCULAR

## 2021-10-21 MED ORDER — FOLIC ACID 1 MG PO TABS: 1.0000 mg | ORAL_TABLET | Freq: Every day | ORAL | 1 refills | Status: DC

## 2021-10-21 MED ORDER — THIAMINE HCL 100 MG PO TABS: 100.0000 mg | ORAL_TABLET | ORAL | 1 refills | Status: DC

## 2021-10-21 NOTE — Progress Notes (Signed)
? ?Subjective:  ?Patient ID: Judy Conner, female    DOB: [birth year]  Age: 36 y.o. MRN: [id number] ? ?CC: Annual Exam and Anemia ? ? ?HPI ?Judy Conner presents for f/up - ? ?She complains of heartburn and says she needs a refill on the PPI.  She denies odynophagia, dysphagia, abdominal pain, bright red blood per rectum, or melena.  She admits she is not taking some of her vitamin supplements. ? ?Outpatient Medications Prior to Visit  ?Medication Sig Dispense Refill  ? famotidine (PEPCID) 20 MG tablet Take 1 tablet (20 mg total) by mouth 2 (two) times daily. 60 tablet 0  ? potassium chloride SA (KLOR-CON) 20 MEQ tablet Take 1 tablet (20 mEq total) by mouth 3 (three) times daily. 270 tablet 0  ? ferrous sulfate 220 (44 Fe) MG/5ML solution Take 5 mLs (220 mg total) by mouth daily. (Patient taking differently: Take 220 mg by mouth as needed.) 473 mL 1  ? folic acid (FOLVITE) 1 MG tablet Take 1 tablet (1 mg total) by mouth daily. 90 tablet 1  ? pantoprazole (PROTONIX) 40 MG tablet Take 1 tablet (40 mg total) by mouth daily. 30 tablet 11  ? promethazine (PHENERGAN) 25 MG tablet Take 1 tablet (25 mg total) by mouth every 8 (eight) hours as needed for nausea or vomiting. 30 tablet 1  ? thiamine 100 MG tablet Take 1 tablet (100 mg total) by mouth every other day. 45 tablet 1  ? ?No facility-administered medications prior to visit.  ? ? ?ROS ?Review of Systems  ?Constitutional:  Negative for chills, diaphoresis, fatigue and fever.  ?HENT: Negative.  Negative for trouble swallowing.   ?Eyes: Negative.   ?Respiratory: Negative.  Negative for cough, chest tightness, shortness of breath and wheezing.   ?Cardiovascular:  Negative for chest pain, palpitations and leg swelling.  ?Gastrointestinal:  Negative for abdominal pain, constipation, diarrhea, nausea and vomiting.  ?Endocrine: Negative.   ?Genitourinary: Negative.  Negative for difficulty urinating.  ?Musculoskeletal: Negative.  Negative for arthralgias.  ?Skin:  Negative.   ?Neurological:  Negative for dizziness, weakness, light-headedness and headaches.  ?Hematological:  Negative for adenopathy. Does not bruise/bleed easily.  ?Psychiatric/Behavioral: Negative.    ? ?Objective:  ?BP 126/84 (BP Location: Left Arm, Patient Position: Sitting, Cuff Size: Large)   Pulse 98   Temp 98.6 ?F (37 ?C) (Oral)   Ht '5\' 5"'$  (1.651 m)   Wt 225 lb (102.1 kg)   SpO2 99%   BMI 37.44 kg/m?  ? ?BP Readings from Last 3 Encounters:  ?10/21/21 126/84  ?03/31/21 125/69  ?01/30/21 118/70  ? ? ?Wt Readings from Last 3 Encounters:  ?10/21/21 225 lb (102.1 kg)  ?01/30/21 210 lb (95.3 kg)  ?12/28/20 215 lb (97.5 kg)  ? ? ?Physical Exam ?Vitals reviewed.  ?Constitutional:   ?   Appearance: Normal appearance.  ?HENT:  ?   Nose: Nose normal.  ?   Mouth/Throat:  ?   Mouth: Mucous membranes are moist.  ?Eyes:  ?   General: No scleral icterus. ?   Conjunctiva/sclera: Conjunctivae normal.  ?Cardiovascular:  ?   Rate and Rhythm: Normal rate and regular rhythm.  ?   Heart sounds: No murmur heard. ?Pulmonary:  ?   Effort: Pulmonary effort is normal.  ?   Breath sounds: No stridor. No wheezing, rhonchi or rales.  ?Abdominal:  ?   General: Abdomen is protuberant.  ?   Palpations: There is no mass.  ?   Tenderness: There is no abdominal tenderness.  There is no guarding.  ?   Hernia: No hernia is present.  ?Musculoskeletal:     ?   General: Normal range of motion.  ?   Cervical back: Neck supple.  ?   Right lower leg: No edema.  ?   Left lower leg: No edema.  ?Lymphadenopathy:  ?   Cervical: No cervical adenopathy.  ?Skin: ?   General: Skin is warm and dry.  ?Neurological:  ?   General: No focal deficit present.  ?   Mental Status: She is alert.  ? ? ?Lab Results  ?Component Value Date  ? WBC 8.2 10/21/2021  ? HGB 12.2 10/21/2021  ? HCT 37.6 10/21/2021  ? PLT 324.0 10/21/2021  ? GLUCOSE 85 10/21/2021  ? CHOL 148 12/11/2019  ? TRIG 108.0 12/11/2019  ? HDL 30.40 (L) 12/11/2019  ? [place] 96 12/11/2019  ? ALT 10  08/15/2020  ? AST 12 08/15/2020  ? NA 139 10/21/2021  ? K 3.5 10/21/2021  ? CL 105 10/21/2021  ? CREATININE 0.92 10/21/2021  ? BUN 7 10/21/2021  ? CO2 26 10/21/2021  ? TSH 1.01 12/11/2019  ? INR 1.1 (H) 08/15/2020  ? HGBA1C 6.0 10/21/2021  ? ? ?DG INJECT DIAG/THERA/INC NEEDLE/CATH/PLC EPI/LUMB/SAC W/IMG ? ?Result Date: 03/31/2021 ?CLINICAL DATA:  Lumbosacral spondylosis without myelopathy. Low back and left greater than leg pain. Transitional anatomy with complete sacralization of L5. Degenerative disc disease at L4-L5 (labeled L5-S1 on the reviewed outside MRI). Mild improvement after first epidural injection at outside facility in June. FLUOROSCOPY TIME:  Radiation Exposure Index (as provided by the fluoroscopic device): 3.0 mGy Fluoroscopy Time:  19 seconds Number of Acquired Images:  0 PROCEDURE: The procedure, risks, benefits, and alternatives were explained to the patient. Questions regarding the procedure were encouraged and answered. The patient understands and consents to the procedure. LUMBAR EPIDURAL INJECTION: An interlaminar approach was performed on the left at L4-L5. The overlying skin was cleansed and anesthetized. A 3.5 inch 20 gauge epidural needle was advanced using loss-of-resistance technique. DIAGNOSTIC EPIDURAL INJECTION: Injection of Isovue-M 200 shows a good epidural pattern with spread above and below the level of needle placement, primarily on the left. No vascular opacification is seen. THERAPEUTIC EPIDURAL INJECTION: 80 mg of Depo-Medrol mixed with 3 mL of 1% lidocaine were instilled. The procedure was well-tolerated, and the patient was discharged thirty minutes following the injection in good condition. COMPLICATIONS: None immediate. IMPRESSION: Technically successful interlaminar epidural injection on the left at L4-L5. Electronically Signed   By: Titus Dubin M.D.   On: 03/31/2021 08:33  ? ? ?Assessment & Plan:  ? ?[name] was seen today for annual exam and anemia. ? ?Diagnoses  and all orders for this visit: ? ?Vitamin B12 deficiency anemia due to intrinsic factor deficiency ?-     CBC with Differential/Platelet; Future ?-     cyanocobalamin ((VITAMIN B-12)) injection 1,000 mcg ?-     CBC with Differential/Platelet ? ?Iron deficiency ?-     CBC with Differential/Platelet; Future ?-     IBC + Ferritin; Future ?-     IBC + Ferritin ?-     CBC with Differential/Platelet ?-     Ferric Maltol (ACCRUFER) 30 MG CAPS; Take 1 capsule by mouth in the morning and at bedtime. ? ?Dietary folate deficiency anemia ?-     CBC with Differential/Platelet; Future ?-     folic acid (FOLVITE) 1 MG tablet; Take 1 tablet (1 mg total) by mouth daily. ?-  CBC with Differential/Platelet ? ?Thiamine deficiency ?-     CBC with Differential/Platelet; Future ?-     CBC with Differential/Platelet ? ?Anemia due to acquired thiamine deficiency ?-     thiamine 100 MG tablet; Take 1 tablet (100 mg total) by mouth every other day. ? ?Prediabetes ?-     Basic metabolic panel; Future ?-     Hemoglobin A1c; Future ?-     Hemoglobin A1c ?-     Basic metabolic panel ? ?Gastroesophageal reflux disease, unspecified whether esophagitis present ?-     pantoprazole (PROTONIX) 40 MG tablet; Take 1 tablet (40 mg total) by mouth daily. ? ? ?I have discontinued Bethzaida Kathman's ferrous sulfate and promethazine. I am also having her start on ACCRUFeR. Additionally, I am having her maintain her famotidine, potassium chloride SA, folic acid, thiamine, and pantoprazole. We administered cyanocobalamin. ? ?Meds ordered this encounter  ?Medications  ? folic acid (FOLVITE) 1 MG tablet  ?  Sig: Take 1 tablet (1 mg total) by mouth daily.  ?  Dispense:  90 tablet  ?  Refill:  1  ? thiamine 100 MG tablet  ?  Sig: Take 1 tablet (100 mg total) by mouth every other day.  ?  Dispense:  45 tablet  ?  Refill:  1  ? cyanocobalamin ((VITAMIN B-12)) injection 1,000 mcg  ? Ferric Maltol (ACCRUFER) 30 MG CAPS  ?  Sig: Take 1 capsule by mouth in the  morning and at bedtime.  ?  Dispense:  180 capsule  ?  Refill:  1  ? pantoprazole (PROTONIX) 40 MG tablet  ?  Sig: Take 1 tablet (40 mg total) by mouth daily.  ?  Dispense:  90 tablet  ?  Refill:  1  ? ? ? ?Follow-up:

## 2021-10-21 NOTE — Patient Instructions (Signed)

## 2021-10-30 ENCOUNTER — Telehealth: Payer: Self-pay

## 2021-10-30 NOTE — Telephone Encounter (Signed)
[facility] sent an additional info form to be completed needing directions for use and to clarify the Dx. I have also attached pt's recent 10/21/21 iron lab draw result. ? ?I have completed form and faxed back.  ?

## 2021-10-30 NOTE — Telephone Encounter (Signed)
Key: [id number] ?

## 2021-11-02 NOTE — Telephone Encounter (Signed)
approved through 05/01/2022 ?

## 2022-04-05 ENCOUNTER — Telehealth: Payer: Self-pay

## 2022-04-05 NOTE — Telephone Encounter (Signed)
Key: [id number]

## 2022-04-05 NOTE — Telephone Encounter (Signed)
[facility] requested additional information for PA.   10/21/21 OV note has been printed and faxed.

## 2022-06-21 ENCOUNTER — Emergency Department
Admission: EM | Admit: 2022-06-21 | Discharge: 2022-06-21 | Discharge status: Against Medical Advice | Payer: BC Managed Care – PPO

## 2022-06-22 ENCOUNTER — Telehealth: Payer: Self-pay

## 2022-06-22 NOTE — Telephone Encounter (Signed)
Called pt to conduct transition of care call.  Pt stated she left AMA due to long wait time. PT mentioned that she has a tooth abscess and needed an Abx.  I have scheduled her for Thursday 12/7 @ 9.20am with PCP for her concern.

## 2022-06-22 NOTE — Telephone Encounter (Signed)
Patient has been contacted and scheduled for ED F/U.

## 2022-06-24 ENCOUNTER — Ambulatory Visit: Payer: BC Managed Care – PPO | Admitting: Internal Medicine

## 2022-06-24 ENCOUNTER — Encounter: Payer: Self-pay | Admitting: Internal Medicine

## 2022-06-24 VITALS — BP 134/84 | HR 90 | Temp 97.7°F | Resp 16 | Ht 65.0 in | Wt 224.0 lb

## 2022-06-24 DIAGNOSIS — E519 Thiamine deficiency, unspecified: Secondary | ICD-10-CM | POA: Diagnosis not present

## 2022-06-24 DIAGNOSIS — K0889 Other specified disorders of teeth and supporting structures: Secondary | ICD-10-CM

## 2022-06-24 DIAGNOSIS — D510 Vitamin B12 deficiency anemia due to intrinsic factor deficiency: Secondary | ICD-10-CM

## 2022-06-24 DIAGNOSIS — D538 Other specified nutritional anemias: Secondary | ICD-10-CM

## 2022-06-24 DIAGNOSIS — E611 Iron deficiency: Secondary | ICD-10-CM

## 2022-06-24 DIAGNOSIS — D520 Dietary folate deficiency anemia: Secondary | ICD-10-CM

## 2022-06-24 DIAGNOSIS — K047 Periapical abscess without sinus: Secondary | ICD-10-CM

## 2022-06-24 DIAGNOSIS — Z23 Encounter for immunization: Secondary | ICD-10-CM

## 2022-06-24 LAB — CBC WITH DIFFERENTIAL/PLATELET
Basophils Absolute: 0 10*3/uL (ref 0.0–0.1)
Basophils Relative: 0.4 % (ref 0.0–3.0)
Eosinophils Absolute: 0.3 10*3/uL (ref 0.0–0.7)
Eosinophils Relative: 3 % (ref 0.0–5.0)
HCT: 39.2 % (ref 36.0–46.0)
Hemoglobin: 12.7 g/dL (ref 12.0–15.0)
Lymphocytes Relative: 37.1 % (ref 12.0–46.0)
Lymphs Abs: 4.1 10*3/uL — ABNORMAL HIGH (ref 0.7–4.0)
MCHC: 32.5 g/dL (ref 30.0–36.0)
MCV: 92 fl (ref 78.0–100.0)
Monocytes Absolute: 0.7 10*3/uL (ref 0.1–1.0)
Monocytes Relative: 6.6 % (ref 3.0–12.0)
Neutro Abs: 5.8 10*3/uL (ref 1.4–7.7)
Neutrophils Relative %: 52.9 % (ref 43.0–77.0)
Platelets: 387 10*3/uL (ref 150.0–400.0)
RBC: 4.27 Mil/uL (ref 3.87–5.11)
RDW: 17.4 % — ABNORMAL HIGH (ref 11.5–15.5)
WBC: 11 10*3/uL — ABNORMAL HIGH (ref 4.0–10.5)

## 2022-06-24 MED ORDER — AMOXICILLIN-POT CLAVULANATE 875-125 MG PO TABS: 1.0000 | ORAL_TABLET | Freq: Two times a day (BID) | ORAL | 0 refills | Status: AC

## 2022-06-24 MED ORDER — HYDROCODONE-ACETAMINOPHEN 10-325 MG PO TABS: 1.0000 | ORAL_TABLET | Freq: Three times a day (TID) | ORAL | 0 refills | Status: DC | PRN

## 2022-06-24 MED ORDER — FOLIC ACID 1 MG PO TABS: 1.0000 mg | ORAL_TABLET | Freq: Every day | ORAL | 1 refills | Status: DC

## 2022-06-24 MED ORDER — THIAMINE HCL 100 MG PO TABS: 100.0000 mg | ORAL_TABLET | ORAL | 1 refills | Status: DC

## 2022-06-24 MED ORDER — FLUCONAZOLE 150 MG PO TABS: 150.0000 mg | ORAL_TABLET | Freq: Once | ORAL | 3 refills | Status: AC

## 2022-06-24 MED ORDER — CYANOCOBALAMIN 1000 MCG/ML IJ SOLN
1000.0000 ug | Freq: Once | INTRAMUSCULAR | Status: AC
  Administered 2022-06-24: 1000 ug via INTRAMUSCULAR

## 2022-06-24 NOTE — Patient Instructions (Signed)
Dental Pain ?Dental pain is often a sign that something is wrong with your teeth or gums. It is also something that can occur following dental treatment. If you have dental pain, it is important to contact your dental care provider, especially if the cause of the pain has not been determined. Dental pain may be of varying intensity and can be caused by many things, including: ?Tooth decay (cavities or caries). Cavities are caused by bacteria that produce acids that irritate the nerve of your tooth, making it sensitive to air and hot or cold temperatures. This eventually causes discomfort or pain. ?Abscess or infection. Once the bacteria reach the inner part of the tooth (pulp), a bacterial infection (dental abscess) can occur. Pus typically collects at the end of the root of a tooth. ?Injury. ?A crack in the tooth. ?Gum recession exposing the root, and possibly the nerves, of a tooth. ?Gum (periodontal)disease. ?Abnormal grinding or clenching. ?Poor or improper home care. ?An unknown reason (idiopathic). ?Your pain may be mild or severe. It may occur when you are: ?Chewing. ?Exposed to hot or cold temperatures. ?Eating or drinking sugary foods or beverages, such as soda or candy. ?Your pain may be constant, or it may come and go without cause. ?Follow these instructions at home: ?The following actions may help to lessen any discomfort that you are feeling before or after getting dental care. ?Medicines ?Take over-the-counter and prescription medicines only as told by your dental care provider. ?If you were prescribed an antibiotic medicine, take it as told by your dental care provider. Do not stop taking the antibiotic even if you start to feel better. ?Eating and drinking ?Avoid foods or drinks that cause you pain, such as: ?Very hot or very cold foods or drinks. ?Sweet or sugary foods or drinks. ?Managing pain and swelling ? ?Ice can sometimes be used to reduce pain and swelling, especially if the pain is  following dental treatment. ?If directed, put ice on the painful area of your face. To do this: ?Put ice in a plastic bag. ?Place a towel between your skin and the bag. ?Leave the ice on for 20 minutes, 2-3 times a day. ?Remove the ice if your skin turns bright red. This is very important. If you cannot feel pain, heat, or cold, you have a greater risk of damage to the area. ?Brushing your teeth ?To keep your mouth and gums healthy, brush your teeth twice a day using a fluoride toothpaste. ?Use a toothpaste made for sensitive teeth as directed by your dental care provider, especially if the root is exposed. ?Always brush your teeth with a soft-bristled toothbrush. This will help prevent irritation to your gums. ?General instructions ?Floss at least once a day. ?Do not apply heat to the outside of the face. ?Gargle with a mixture of salt and water 3-4 times a day or as needed. To make salt water, completely dissolve ?-1 tsp (3-6 g) of salt in 1 cup (237 mL) of warm water. ?Keep all follow-up visits. This is important. ?Contact a dental care provider if: ?You have any unexplained dental pain. ?Your pain is not controlled with medicines. ?Your symptoms get worse. ?You have new symptoms. ?Get help right away if: ?You are unable to open your mouth. ?You are having trouble breathing or swallowing. ?You have a fever. ?You notice that your face, neck, or jaw is swollen. ?These symptoms may represent a serious problem that is an emergency. Do not wait to see if the symptoms will go   away. Get medical help right away. Call your local emergency services (911 in the [place] Do not drive yourself to the hospital. ?Summary ?Dental pain may be caused by many things, including tooth decay and infection. ?Your pain may be mild or severe. ?Take over-the-counter and prescription medicines only as told by your dental care provider. ?Watch your dental pain for any changes. Let your dental care provider know if your symptoms get  worse. ?This information is not intended to replace advice given to you by your health care provider. Make sure you discuss any questions you have with your health care provider. ?Document Revised: 04/09/2020 Document Reviewed: 04/09/2020 ?Elsevier Patient Education ? 2023 [employer] ? ?

## 2022-06-24 NOTE — Progress Notes (Signed)
Subjective:  Patient ID: Judy Conner, female    DOB: 1986-03-03  Age: 36 y.o. MRN: [id number]  CC: Anemia   HPI Judy Conner presents for f/up -  She complains of a 4-day history of left upper tooth ache and swelling around the gumline.  She denies trouble swallowing, fever, chills, or facial swelling.  Outpatient Medications Prior to Visit  Medication Sig Dispense Refill   famotidine (PEPCID) 20 MG tablet Take 1 tablet (20 mg total) by mouth 2 (two) times daily. 60 tablet 0   Ferric Maltol (ACCRUFER) 30 MG CAPS Take 1 capsule by mouth in the morning and at bedtime. 180 capsule 1   pantoprazole (PROTONIX) 40 MG tablet Take 1 tablet (40 mg total) by mouth daily. 90 tablet 1   potassium chloride SA (KLOR-CON) 20 MEQ tablet Take 1 tablet (20 mEq total) by mouth 3 (three) times daily. 891 tablet 0   folic acid (FOLVITE) 1 MG tablet Take 1 tablet (1 mg total) by mouth daily. 90 tablet 1   thiamine 100 MG tablet Take 1 tablet (100 mg total) by mouth every other day. 45 tablet 1   No facility-administered medications prior to visit.    ROS Review of Systems  Constitutional:  Negative for chills, diaphoresis, fatigue and fever.  HENT: Negative.  Negative for facial swelling, sore throat and trouble swallowing.   Eyes: Negative.   Respiratory:  Negative for cough, chest tightness, shortness of breath and wheezing.   Cardiovascular:  Negative for chest pain, palpitations and leg swelling.  Gastrointestinal:  Negative for abdominal pain, diarrhea and nausea.  Endocrine: Negative.   Genitourinary: Negative.  Negative for difficulty urinating.  Musculoskeletal: Negative.   Skin: Negative.   Neurological: Negative.  Negative for dizziness and weakness.  Hematological: Negative.   Psychiatric/Behavioral: Negative.      Objective:  BP 134/84 (BP Location: Right Arm, Patient Position: Sitting, Cuff Size: Large)   Pulse 90   Temp 97.7 F (36.5 C) (Oral)   Resp 16   Ht '5\' 5"'$   (1.651 m)   Wt 224 lb (101.6 kg)   LMP 06/22/2022   SpO2 98%   BMI 37.28 kg/m   BP Readings from Last 3 Encounters:  06/24/22 134/84  10/21/21 126/84  03/31/21 125/69    Wt Readings from Last 3 Encounters:  06/24/22 224 lb (101.6 kg)  10/21/21 225 lb (102.1 kg)  01/30/21 210 lb (95.3 kg)    Physical Exam Vitals reviewed.  Constitutional:      General: She is not in acute distress.    Appearance: She is not ill-appearing, toxic-appearing or diaphoretic.  HENT:     Head:     Comments: Teeth numbers 14 and 15 show significant decay with mild swelling of the gums.  There is no swelling within the mouth and no exudate.    Nose: Nose normal.     Mouth/Throat:     Mouth: Mucous membranes are moist.  Eyes:     General: No scleral icterus.    Conjunctiva/sclera: Conjunctivae normal.  Cardiovascular:     Rate and Rhythm: Normal rate and regular rhythm.     Heart sounds: No murmur heard. Pulmonary:     Effort: Pulmonary effort is normal.     Breath sounds: No stridor. No wheezing, rhonchi or rales.  Abdominal:     General: Abdomen is flat.     Palpations: There is no mass.     Tenderness: There is no abdominal tenderness. There is no  guarding.     Hernia: No hernia is present.  Musculoskeletal:        General: Normal range of motion.     Cervical back: Neck supple.     Right lower leg: No edema.     Left lower leg: No edema.  Lymphadenopathy:     Cervical: No cervical adenopathy.  Skin:    General: Skin is warm and dry.  Neurological:     General: No focal deficit present.     Mental Status: She is alert.  Psychiatric:        Mood and Affect: Mood normal.     Lab Results  Component Value Date   WBC 11.0 (H) 06/24/2022   HGB 12.7 06/24/2022   HCT 39.2 06/24/2022   PLT 387.0 06/24/2022   GLUCOSE 85 10/21/2021   CHOL 148 12/11/2019   TRIG 108.0 12/11/2019   HDL 30.40 (L) 12/11/2019   LDLCALC 96 12/11/2019   ALT 10 08/15/2020   AST 12 08/15/2020   NA 139  10/21/2021   K 3.5 10/21/2021   CL 105 10/21/2021   CREATININE 0.92 10/21/2021   BUN 7 10/21/2021   CO2 26 10/21/2021   TSH 1.01 12/11/2019   INR 1.1 (H) 08/15/2020   HGBA1C 6.0 10/21/2021    No results found.  Assessment & Plan:   Judy Conner was seen today for anemia.  Diagnoses and all orders for this visit:  Iron deficiency- Her H/H are normal now. -     CBC with Differential/Platelet; Future -     CBC with Differential/Platelet  Thiamine deficiency -     CBC with Differential/Platelet; Future -     CBC with Differential/Platelet  Dietary folate deficiency anemia -     CBC with Differential/Platelet; Future -     folic acid (FOLVITE) 1 MG tablet; Take 1 tablet (1 mg total) by mouth daily. -     CBC with Differential/Platelet  Anemia due to acquired thiamine deficiency -     CBC with Differential/Platelet; Future -     thiamine (VITAMIN B1) 100 MG tablet; Take 1 tablet (100 mg total) by mouth every other day. -     CBC with Differential/Platelet  Vitamin B12 deficiency anemia due to intrinsic factor deficiency -     CBC with Differential/Platelet; Future -     cyanocobalamin (VITAMIN B12) injection 1,000 mcg -     CBC with Differential/Platelet  Dental abscess- Will treat with Augmentin and will control the pain with hydrocodone. -     amoxicillin-clavulanate (AUGMENTIN) 875-125 MG tablet; Take 1 tablet by mouth 2 (two) times daily for 7 days. -     HYDROcodone-acetaminophen (NORCO) 10-325 MG tablet; Take 1 tablet by mouth every 8 (eight) hours as needed. -     fluconazole (DIFLUCAN) 150 MG tablet; Take 1 tablet (150 mg total) by mouth once for 1 dose.  Toothache -     HYDROcodone-acetaminophen (NORCO) 10-325 MG tablet; Take 1 tablet by mouth every 8 (eight) hours as needed.  Other orders -     Flu Vaccine QUAD 6+ mos PF IM (Fluarix Quad PF)   I have changed Judy Conner's thiamine. I am also having her start on amoxicillin-clavulanate,  HYDROcodone-acetaminophen, and fluconazole. Additionally, I am having her maintain her famotidine, potassium chloride SA, ACCRUFeR, pantoprazole, and folic acid. We administered cyanocobalamin.  Meds ordered this encounter  Medications   amoxicillin-clavulanate (AUGMENTIN) 875-125 MG tablet    Sig: Take 1 tablet by mouth 2 (two)  times daily for 7 days.    Dispense:  14 tablet    Refill:  0   HYDROcodone-acetaminophen (NORCO) 10-325 MG tablet    Sig: Take 1 tablet by mouth every 8 (eight) hours as needed.    Dispense:  20 tablet    Refill:  0   thiamine (VITAMIN B1) 100 MG tablet    Sig: Take 1 tablet (100 mg total) by mouth every other day.    Dispense:  45 tablet    Refill:  1   folic acid (FOLVITE) 1 MG tablet    Sig: Take 1 tablet (1 mg total) by mouth daily.    Dispense:  90 tablet    Refill:  1   fluconazole (DIFLUCAN) 150 MG tablet    Sig: Take 1 tablet (150 mg total) by mouth once for 1 dose.    Dispense:  1 tablet    Refill:  3   cyanocobalamin (VITAMIN B12) injection 1,000 mcg     Follow-up: Return in about 3 months (around 09/23/2022).  Scarlette Calico, MD

## 2022-09-21 ENCOUNTER — Encounter: Payer: Self-pay | Admitting: Physician Assistant

## 2022-09-21 ENCOUNTER — Ambulatory Visit: Payer: BC Managed Care – PPO | Admitting: Family

## 2022-09-21 ENCOUNTER — Telehealth (INDEPENDENT_AMBULATORY_CARE_PROVIDER_SITE_OTHER): Payer: BC Managed Care – PPO | Admitting: Physician Assistant

## 2022-09-21 ENCOUNTER — Other Ambulatory Visit: Payer: Self-pay

## 2022-09-21 DIAGNOSIS — N390 Urinary tract infection, site not specified: Secondary | ICD-10-CM | POA: Insufficient documentation

## 2022-09-21 DIAGNOSIS — R112 Nausea with vomiting, unspecified: Secondary | ICD-10-CM | POA: Insufficient documentation

## 2022-09-21 DIAGNOSIS — R109 Unspecified abdominal pain: Secondary | ICD-10-CM

## 2022-09-21 DIAGNOSIS — R1032 Left lower quadrant pain: Secondary | ICD-10-CM

## 2022-09-21 DIAGNOSIS — R110 Nausea: Secondary | ICD-10-CM | POA: Diagnosis not present

## 2022-09-21 DIAGNOSIS — D72829 Elevated white blood cell count, unspecified: Secondary | ICD-10-CM | POA: Insufficient documentation

## 2022-09-21 NOTE — Patient Instructions (Signed)
Please proceed to the nearest emergency department.  You may need imaging, which would not be available at an urgent care, therefore I do feel emergency department is appropriate at this time.

## 2022-09-21 NOTE — Progress Notes (Signed)
   Virtual Visit via Video Note  I connected with  Judy Conner  on 09/21/22 at  1:30 PM EST by a video enabled telemedicine application and verified that I am speaking with the correct person using two identifiers.  Location: Patient: home Provider: Therapist, music at [place] present: Patient and myself   I discussed the limitations of evaluation and management by telemedicine and the availability of in person appointments. The patient expressed understanding and agreed to proceed.   History of Present Illness:  37 year old female presenting for virtual visit today planing of dull, achy pain on left lower flank. 9/10 pain.  Symptoms started about 5 days ago.  Just felt nauseated yesterday, continuing into today. Hasn't taken anything for symptoms today.No blood in urine. No dysuria. Slight dizzy, no headache.  Just finished menstrual cycle last week.   She was seen at urgent care yesterday and given Flagyl and Macrobid to treat both BV and acute cystitis.  Patient states that she did not have the flank pain yesterday at the urgent care visit.   Observations/Objective:   Gen: Awake, alert, laying on her stomach secondary to pain Resp: Breathing is even and non-labored Psych: calm/pleasant demeanor Neuro: Alert and Oriented x 3, + facial symmetry, speech is clear. MSK: She points to her left lower abdomen and her left flank as areas of tenderness today.   Assessment and Plan:  1. Acute left flank pain 2. Left lower quadrant abdominal pain 3. Nausea Discussed with patient limitations of virtual visit in regard to her symptoms today.  Differential diagnosis includes, but not limited to: complicated UTI, renal stone, pyelonephritis, diverticulitis, PID.  With her new onset of 9 out of 10 pain that she is describing today, I do feel it necessary for her to be evaluated in the emergency department today.  She may need labs and imaging, will defer to the  provider who will end up seeing the patient in person.  Patient is agreeable and will head to [facility] emergency department at this time.  Work note provided for her today.    Follow Up Instructions:    I discussed the assessment and treatment plan with the patient. The patient was provided an opportunity to ask questions and all were answered. The patient agreed with the plan and demonstrated an understanding of the instructions.   The patient was advised to call back or seek an in-person evaluation if the symptoms worsen or if the condition fails to improve as anticipated.  Rital Cavey M Cher Franzoni, PA-C

## 2022-09-21 NOTE — ED Triage Notes (Signed)
Pt has left flank pain for 5 days.  Intermittent pain.  Pt reports nausea and vomiting. Pt was seen at urgent care yesterday and dx with uti.  Pt did not get rx filled.  Pt alert.

## 2022-09-22 ENCOUNTER — Emergency Department
Admission: EM | Admit: 2022-09-22 | Discharge: 2022-09-22 | Disposition: A | Discharge status: Home / Self Care | Payer: BC Managed Care – PPO | Attending: Emergency Medicine | Admitting: Emergency Medicine

## 2022-09-22 ENCOUNTER — Encounter: Payer: Self-pay | Admitting: *Deleted

## 2022-09-22 ENCOUNTER — Emergency Department: Payer: BC Managed Care – PPO

## 2022-09-22 DIAGNOSIS — R112 Nausea with vomiting, unspecified: Secondary | ICD-10-CM

## 2022-09-22 DIAGNOSIS — N390 Urinary tract infection, site not specified: Secondary | ICD-10-CM

## 2022-09-22 LAB — CBC
HCT: 38.7 % (ref 36.0–46.0)
Hemoglobin: 12.4 g/dL (ref 12.0–15.0)
MCH: 29 pg (ref 26.0–34.0)
MCHC: 32 g/dL (ref 30.0–36.0)
MCV: 90.4 fL (ref 80.0–100.0)
Platelets: 362 10*3/uL (ref 150–400)
RBC: 4.28 MIL/uL (ref 3.87–5.11)
RDW: 16.3 % — ABNORMAL HIGH (ref 11.5–15.5)
WBC: 14.4 10*3/uL — ABNORMAL HIGH (ref 4.0–10.5)
nRBC: 0 % (ref 0.0–0.2)

## 2022-09-22 LAB — URINALYSIS, ROUTINE W REFLEX MICROSCOPIC
Bacteria, UA: NONE SEEN
Bilirubin Urine: NEGATIVE
Glucose, UA: NEGATIVE mg/dL
Ketones, ur: NEGATIVE mg/dL
Nitrite: NEGATIVE
Protein, ur: 100 mg/dL — AB
Specific Gravity, Urine: 1.016 (ref 1.005–1.030)
WBC, UA: >50 WBC/hpf (ref 0–5)
pH: 6 (ref 5.0–8.0)

## 2022-09-22 LAB — BASIC METABOLIC PANEL
Anion gap: 11 (ref 5–15)
BUN: 9 mg/dL (ref 6–20)
CO2: 23 mmol/L (ref 22–32)
Calcium: 8.6 mg/dL — ABNORMAL LOW (ref 8.9–10.3)
Chloride: 104 mmol/L (ref 98–111)
Creatinine, Ser: 0.99 mg/dL (ref 0.44–1.00)
GFR, Estimated: >60 mL/min (ref >60)
Glucose, Bld: 129 mg/dL — ABNORMAL HIGH (ref 70–99)
Potassium: 3.4 mmol/L — ABNORMAL LOW (ref 3.5–5.1)
Sodium: 138 mmol/L (ref 135–145)

## 2022-09-22 LAB — POC URINE PREG, ED: Preg Test, Ur: NEGATIVE

## 2022-09-22 MED ORDER — METOCLOPRAMIDE HCL 5 MG/ML IJ SOLN
10.0000 mg | Freq: Once | INTRAMUSCULAR | Status: AC
  Administered 2022-09-22: 10 mg via INTRAVENOUS
  Filled 2022-09-22: qty 2

## 2022-09-22 MED ORDER — SODIUM CHLORIDE 0.9 % IV BOLUS (SEPSIS)
1000.0000 mL | Freq: Once | INTRAVENOUS | Status: AC
  Administered 2022-09-22: 1000 mL via INTRAVENOUS

## 2022-09-22 MED ORDER — ONDANSETRON HCL 4 MG/2ML IJ SOLN
4.0000 mg | Freq: Once | INTRAMUSCULAR | Status: AC
  Administered 2022-09-22: 4 mg via INTRAVENOUS
  Filled 2022-09-22: qty 2

## 2022-09-22 MED ORDER — KETOROLAC TROMETHAMINE 30 MG/ML IJ SOLN
30.0000 mg | Freq: Once | INTRAMUSCULAR | Status: AC
  Administered 2022-09-22: 30 mg via INTRAVENOUS
  Filled 2022-09-22: qty 1

## 2022-09-22 MED ORDER — ACETAMINOPHEN 500 MG PO TABS
1000.0000 mg | ORAL_TABLET | Freq: Once | ORAL | Status: AC
  Administered 2022-09-22: 1000 mg via ORAL
  Filled 2022-09-22: qty 2

## 2022-09-22 MED ORDER — ONDANSETRON 4 MG PO TBDP: 4.0000 mg | ORAL_TABLET | Freq: Four times a day (QID) | ORAL | 0 refills | Status: DC | PRN

## 2022-09-22 MED ORDER — SODIUM CHLORIDE 0.9 % IV SOLN
2.0000 g | Freq: Once | INTRAVENOUS | Status: AC
  Administered 2022-09-22: 2 g via INTRAVENOUS
  Filled 2022-09-22: qty 20

## 2022-09-22 MED ORDER — CEPHALEXIN 500 MG PO CAPS: 500.0000 mg | ORAL_CAPSULE | Freq: Two times a day (BID) | ORAL | 0 refills | Status: DC

## 2022-09-22 MED ORDER — IOHEXOL 350 MG/ML SOLN
100.0000 mL | Freq: Once | INTRAVENOUS | Status: AC | PRN
  Administered 2022-09-22: 100 mL via INTRAVENOUS

## 2022-09-22 NOTE — ED Notes (Signed)
Patient discharged at this time. Ambulated to lobby with independent and steady gait. Breathing unlabored speaking in full sentences. Verbalized understanding of all discharge, follow up, and medication teaching. Discharged homed with all belongings.

## 2022-09-22 NOTE — ED Provider Notes (Signed)
[facility] Provider Note    Event Date/Time   First MD Initiated Contact with Patient 09/22/22 0102     (approximate)   History   Flank Pain   HPI  Judy Conner is a 37 y.o. female with history of iron deficiency anemia, GERD who presents to the emergency department with left lower back pain and vomiting.  Recently diagnosed with UTI but has not started her antibiotics.  No abdominal pain, dysuria, diarrhea, fever.  No history of kidney stone.   History provided by patient.    Past Medical History:  Diagnosis Date   Anemia    hx low iron    Chronic kidney disease    hx uti none currently   Dysmenorrhea, unspecified 12/12/2019   GERD (gastroesophageal reflux disease)    Headache(784.0)    History of umbilical hernia 123456   Low serum potassium    Sciatica    Urticaria     Past Surgical History:  Procedure Laterality Date   CESAREAN SECTION  [phone]   x 3   FINGER FRACTURE SURGERY Left    pinky    MEDICATIONS:  Prior to Admission medications   Medication Sig Start Date End Date Taking? Authorizing Provider  famotidine (PEPCID) 20 MG tablet Take 1 tablet (20 mg total) by mouth 2 (two) times daily. 10/31/16   Carrie Mew, MD  Ferric Maltol (ACCRUFER) 30 MG CAPS Take 1 capsule by mouth in the morning and at bedtime. 10/21/21   Janith Lima, MD  folic acid (FOLVITE) 1 MG tablet Take 1 tablet (1 mg total) by mouth daily. 06/24/22   Janith Lima, MD  HYDROcodone-acetaminophen (NORCO) 10-325 MG tablet Take 1 tablet by mouth every 8 (eight) hours as needed. 06/24/22   Janith Lima, MD  pantoprazole (PROTONIX) 40 MG tablet Take 1 tablet (40 mg total) by mouth daily. 10/21/21   Janith Lima, MD  potassium chloride SA (KLOR-CON) 20 MEQ tablet Take 1 tablet (20 mEq total) by mouth 3 (three) times daily. 01/30/21   Janith Lima, MD  thiamine (VITAMIN B1) 100 MG tablet Take 1 tablet (100 mg total) by mouth every other day.  06/24/22   Janith Lima, MD    Physical Exam   Triage Vital Signs: ED Triage Vitals  Enc Vitals Group     BP 09/21/22 2355 114/76     Pulse Rate 09/21/22 2355 67     Resp 09/21/22 2355 18     Temp 09/21/22 2355 (!) 97.5 F (36.4 C)     Temp Source 09/21/22 2355 Oral     SpO2 09/21/22 2355 99 %     Weight 09/21/22 2355 221 lb (100.2 kg)     Height 09/21/22 2355 '5\' 5"'$  (1.651 m)     Head Circumference --      Peak Flow --      Pain Score 09/21/22 2359 9     Pain Loc --      Pain Edu? --      Excl. in [place]? --     Most recent vital signs: Vitals:   09/21/22 2355  BP: 114/76  Pulse: 67  Resp: 18  Temp: (!) 97.5 F (36.4 C)  SpO2: 99%    CONSTITUTIONAL: Alert, responds appropriately to questions.  Actively vomiting HEAD: Normocephalic, atraumatic EYES: Conjunctivae clear, pupils appear equal, sclera nonicteric ENT: normal nose; moist mucous membranes NECK: Supple, normal ROM CARD: RRR; S1 and S2 appreciated RESP: Normal  chest excursion without splinting or tachypnea; breath sounds clear and equal bilaterally; no wheezes, no rhonchi, no rales, no hypoxia or respiratory distress, speaking full sentences ABD/GI: Non-distended; soft, non-tender, no rebound, no guarding, no peritoneal signs BACK: The back appears normal, no CVA tenderness EXT: Normal ROM in all joints; no deformity noted, no edema SKIN: Normal color for age and race; warm; no rash on exposed skin NEURO: Moves all extremities equally, normal speech PSYCH: The patient's mood and manner are appropriate.   ED Results / Procedures / Treatments   LABS: (all labs ordered are listed, but only abnormal results are displayed) Labs Reviewed  URINALYSIS, ROUTINE W REFLEX MICROSCOPIC - Abnormal; Notable for the following components:      Result Value   Color, Urine YELLOW (*)    APPearance CLOUDY (*)    Hgb urine dipstick MODERATE (*)    Protein, ur 100 (*)    Leukocytes,Ua LARGE (*)    Non Squamous Epithelial  PRESENT (*)    All other components within normal limits  CBC - Abnormal; Notable for the following components:   WBC 14.4 (*)    RDW 16.3 (*)    All other components within normal limits  BASIC METABOLIC PANEL - Abnormal; Notable for the following components:   Potassium 3.4 (*)    Glucose, Bld 129 (*)    Calcium 8.6 (*)    All other components within normal limits  URINE CULTURE  POC URINE PREG, ED     EKG:  RADIOLOGY: My personal review and interpretation of imaging: CT scan shows cystitis.  No pyelonephritis or kidney stone.  I have personally reviewed all radiology reports.   CT ABDOMEN PELVIS W CONTRAST  Result Date: 09/22/2022 CLINICAL DATA:  Left flank pain x5 days EXAM: CT ABDOMEN AND PELVIS WITH CONTRAST TECHNIQUE: Multidetector CT imaging of the abdomen and pelvis was performed using the standard protocol following bolus administration of intravenous contrast. RADIATION DOSE REDUCTION: This exam was performed according to the departmental dose-optimization program which includes automated exposure control, adjustment of the mA and/or kV according to patient size and/or use of iterative reconstruction technique. CONTRAST:  124m OMNIPAQUE IOHEXOL 350 MG/ML SOLN COMPARISON:  10/24/2016 FINDINGS: Lower chest: Lung bases are clear. Hepatobiliary: 2.3 cm hemangioma in the posterior right hepatic dome (series 2/image 11). Focal fat/altered perfusion along the falciform ligament. Gallbladder is unremarkable. No intrahepatic or extrahepatic duct dilatation. Pancreas: Within normal limits. Spleen: Within normal limits. Adrenals/Urinary Tract: Adrenal glands are within normal limits. Kidneys are normal limits.  No hydronephrosis. Thick-walled bladder, although underdistended. Stomach/Bowel: Stomach is within normal limits. No evidence of bowel obstruction. Normal appendix (series 2/image 86). No colonic wall thickening or inflammatory changes. Vascular/Lymphatic: No evidence of abdominal  aortic aneurysm. No suspicious abdominopelvic lymphadenopathy. Reproductive: Uterus is within normal limits. Bilateral ovaries are within normal limits. Other: No abdominopelvic ascites. Musculoskeletal: Visualized osseous structures are within normal limits. IMPRESSION: Thick-walled bladder, although underdistended. Correlate for cystitis. Otherwise, no CT findings to account for the patient's left flank pain. Electronically Signed   By: SJulian HyM.D.   On: 09/22/2022 01:44     PROCEDURES:  Critical Care performed: No    Procedures    IMPRESSION / MDM / ASSESSMENT AND PLAN / ED COURSE  I reviewed the triage vital signs and the nursing notes.    Patient here with left-sided flank pain, vomiting.  Recent diagnosis of UTI but has not started antibiotics.    DIFFERENTIAL DIAGNOSIS (includes but not  limited to):   UTI, ascending infection, pyelonephritis, kidney stone, doubt appendicitis   Patient's presentation is most consistent with acute presentation with potential threat to life or bodily function.   PLAN: Will obtain CBC, CMP, lipase, urinalysis, urine culture and CT of the abdomen pelvis.  Will give IV fluids, Toradol, Zofran.   MEDICATIONS GIVEN IN ED: Medications  sodium chloride 0.9 % bolus 1,000 mL (1,000 mLs Intravenous New Bag/Given 09/22/22 0120)  ketorolac (TORADOL) 30 MG/ML injection 30 mg (30 mg Intravenous Given 09/22/22 0121)  ondansetron (ZOFRAN) injection 4 mg (4 mg Intravenous Given 09/22/22 0120)  cefTRIAXone (ROCEPHIN) 2 g in sodium chloride 0.9 % 100 mL IVPB (2 g Intravenous New Bag/Given 09/22/22 0122)  iohexol (OMNIPAQUE) 350 MG/ML injection 100 mL (100 mLs Intravenous Contrast Given 09/22/22 0129)  ondansetron (ZOFRAN) injection 4 mg (4 mg Intravenous Given 09/22/22 0150)     ED COURSE: Patient's labs show leukocytosis of 14,000.  Urine does appear grossly infected.  Culture pending.  Will give Rocephin.  Pregnancy test negative.    Patient states  she is feeling much better, tolerating p.o.  She is comfortable with plan for discharge home.  CT scan reviewed and interpreted by myself and radiologist and shows no pyelonephritis, kidney stone but scan consistent with cystitis.   At this time, I do not feel there is any life-threatening condition present. I reviewed all nursing notes, vitals, pertinent previous records.  All lab and urine results, EKGs, imaging ordered have been independently reviewed and interpreted by myself.  I reviewed all available radiology reports from any imaging ordered this visit.  Based on my assessment, I feel the patient is safe to be discharged home without further emergent workup and can continue workup as an outpatient as needed. Discussed all findings, treatment plan as well as usual and customary return precautions.  They verbalize understanding and are comfortable with this plan.  Outpatient follow-up has been provided as needed.  All questions have been answered.   CONSULTS:  none   OUTSIDE RECORDS REVIEWED: Reviewed urgent care note on 09/20/2022 where patient was prescribed Macrobid and Flagyl for UTI and BV.  At that time she was also complaining of nasal congestion, muscle aches, headaches, cough.  She was negative for COVID and flu at that time.  Urine dipstick at that time showed nitrites, leukocytes, blood.       FINAL CLINICAL IMPRESSION(S) / ED DIAGNOSES   Final diagnoses:  Nausea and vomiting in adult  Acute UTI     Rx / DC Orders   ED Discharge Orders          Ordered    ondansetron (ZOFRAN-ODT) 4 MG disintegrating tablet  Every 6 hours PRN        09/22/22 0153    cephALEXin (KEFLEX) 500 MG capsule  2 times daily        09/22/22 0153             Note:  This document was prepared using Dragon voice recognition software and may include unintentional dictation errors.   Tyrick Dunagan, Delice Bison, DO 09/22/22 [phone]

## 2022-09-22 NOTE — ED Notes (Signed)
Patient transported to CT 

## 2022-09-22 NOTE — ED Notes (Signed)
Pt had arm bent, preventing fluids and abx from running. RN straigtened pt arm and educated regarding importance of keeping straight for IVF and abx infusion.

## 2022-09-22 NOTE — Discharge Instructions (Signed)
You may alternate Tylenol 1000 mg every 6 hours as needed for pain, fever and Ibuprofen 800 mg every 6-8 hours as needed for pain, fever.  Please take Ibuprofen with food.  Do not take more than 4000 mg of Tylenol (acetaminophen) in a 24 hour period.  Your workup today was consistent with a lower urinary tract infection.  CT scan showed no other abnormality.  No kidney stone or kidney infection.

## 2022-09-23 ENCOUNTER — Telehealth: Payer: Self-pay

## 2022-09-23 NOTE — Transitions of Care (Post Inpatient/ED Visit) (Signed)
   09/23/2022  Name: Judy Conner MRN: [id number] DOB: 12-29-1985  Today's TOC FU Call Status: Today's TOC FU Call Status:: Unsuccessul Call (1st Attempt) Unsuccessful Call (1st Attempt) Date: 09/23/22  Attempted to reach the patient regarding the most recent Inpatient/ED visit.  Follow Up Plan: Additional outreach attempts will be made to reach the patient to complete the Transitions of Care (Post Inpatient/ED visit) call.   Signature Juanda Crumble, [place] Direct Dial [phone]

## 2022-09-24 LAB — URINE CULTURE: Culture: >=100000 — AB

## 2022-09-24 NOTE — Transitions of Care (Post Inpatient/ED Visit) (Signed)
   09/24/2022  Name: Judy Conner MRN: [id number] DOB: 20-Jan-1986  Today's TOC FU Call Status: Today's TOC FU Call Status:: Unsuccessful Call (2nd Attempt) Unsuccessful Call (1st Attempt) Date: 09/23/22 Unsuccessful Call (2nd Attempt) Date: 09/24/22  Attempted to reach the patient regarding the most recent Inpatient/ED visit.  Follow Up Plan: No further outreach attempts will be made at this time. We have been unable to contact the patient.  Signature Juanda Crumble, [place] Direct Dial [phone]

## 2023-04-12 ENCOUNTER — Emergency Department
Admission: EM | Admit: 2023-04-12 | Discharge: 2023-04-12 | Disposition: A | Discharge status: Home / Self Care | Payer: Medicaid Other | Attending: Emergency Medicine | Admitting: Emergency Medicine

## 2023-04-12 ENCOUNTER — Emergency Department: Payer: Medicaid Other

## 2023-04-12 DIAGNOSIS — O4690 Antepartum hemorrhage, unspecified, unspecified trimester: Secondary | ICD-10-CM

## 2023-04-12 DIAGNOSIS — Z3A01 Less than 8 weeks gestation of pregnancy: Secondary | ICD-10-CM | POA: Insufficient documentation

## 2023-04-12 DIAGNOSIS — O209 Hemorrhage in early pregnancy, unspecified: Secondary | ICD-10-CM | POA: Diagnosis present

## 2023-04-12 DIAGNOSIS — O208 Other hemorrhage in early pregnancy: Secondary | ICD-10-CM | POA: Insufficient documentation

## 2023-04-12 LAB — URINALYSIS, ROUTINE W REFLEX MICROSCOPIC
Bilirubin Urine: NEGATIVE
Glucose, UA: NEGATIVE mg/dL
Hgb urine dipstick: NEGATIVE
Ketones, ur: NEGATIVE mg/dL
Leukocytes,Ua: NEGATIVE
Nitrite: NEGATIVE
Protein, ur: NEGATIVE mg/dL
Specific Gravity, Urine: 1.017 (ref 1.005–1.030)
pH: 8 (ref 5.0–8.0)

## 2023-04-12 LAB — BASIC METABOLIC PANEL
Anion gap: 7 (ref 5–15)
BUN: 7 mg/dL (ref 6–20)
CO2: 23 mmol/L (ref 22–32)
Calcium: 8.9 mg/dL (ref 8.9–10.3)
Chloride: 105 mmol/L (ref 98–111)
Creatinine, Ser: 0.7 mg/dL (ref 0.44–1.00)
GFR, Estimated: >60 mL/min (ref >60)
Glucose, Bld: 84 mg/dL (ref 70–99)
Potassium: 3.5 mmol/L (ref 3.5–5.1)
Sodium: 135 mmol/L (ref 135–145)

## 2023-04-12 LAB — CBC WITH DIFFERENTIAL/PLATELET
Abs Immature Granulocytes: 0.02 10*3/uL (ref 0.00–0.07)
Basophils Absolute: 0.1 10*3/uL (ref 0.0–0.1)
Basophils Relative: 1 %
Eosinophils Absolute: 0.2 10*3/uL (ref 0.0–0.5)
Eosinophils Relative: 2 %
HCT: 39.3 % (ref 36.0–46.0)
Hemoglobin: 12.9 g/dL (ref 12.0–15.0)
Immature Granulocytes: 0 %
Lymphocytes Relative: 36 %
Lymphs Abs: 3.9 10*3/uL (ref 0.7–4.0)
MCH: 31.2 pg (ref 26.0–34.0)
MCHC: 32.8 g/dL (ref 30.0–36.0)
MCV: 94.9 fL (ref 80.0–100.0)
Monocytes Absolute: 1 10*3/uL (ref 0.1–1.0)
Monocytes Relative: 9 %
Neutro Abs: 5.8 10*3/uL (ref 1.7–7.7)
Neutrophils Relative %: 52 %
Platelets: 306 10*3/uL (ref 150–400)
RBC: 4.14 MIL/uL (ref 3.87–5.11)
RDW: 15.6 % — ABNORMAL HIGH (ref 11.5–15.5)
WBC: 10.9 10*3/uL — ABNORMAL HIGH (ref 4.0–10.5)
nRBC: 0 % (ref 0.0–0.2)

## 2023-04-12 LAB — WET PREP, GENITAL
Clue Cells Wet Prep HPF POC: NONE SEEN
Sperm: NONE SEEN
Trich, Wet Prep: NONE SEEN
WBC, Wet Prep HPF POC: <10 (ref <10)
Yeast Wet Prep HPF POC: NONE SEEN

## 2023-04-12 LAB — POC URINE PREG, ED: Preg Test, Ur: POSITIVE — AB

## 2023-04-12 LAB — HCG, QUANTITATIVE, PREGNANCY: hCG, Beta Chain, Quant, S: 58288 m[IU]/mL — ABNORMAL HIGH (ref <5)

## 2023-04-12 LAB — ABO/RH: ABO/RH(D): O POS

## 2023-04-12 NOTE — Discharge Instructions (Signed)
Please follow-up with your OB/GYN.  We will notify you with the results of your swab.  Please return for any new, worsening, or change in symptoms or other concerns.  It was a pleasure caring for you today.

## 2023-04-12 NOTE — ED Notes (Signed)
Pt returns to ED lobby to have IV removed; 22GA SL to left a/c removed; cannula intact, dressing applied

## 2023-04-12 NOTE — ED Notes (Signed)
See triage notes. Patient stated she took a fall and has some vaginal bleeding and lower back pain/cramping. Patient stated "vaginal weirdness feeling." Patient is approximately [redacted] weeks pregnant.

## 2023-04-12 NOTE — ED Triage Notes (Signed)
Pt states that last night she was getting out of bed and fell. Has abrasions to L shin, R forearm. Pt states that since she fell she's been having slight vaginal bleeding. Pt states she thinks she is [redacted] weeks pregnant, unseen by OB at this point. LMP 8/9. O9G2.

## 2023-04-12 NOTE — ED Provider Notes (Signed)
[facility] Provider Note    Event Date/Time   First MD Initiated Contact with Patient 04/12/23 1239     (approximate)   History   Vaginal Bleeding and Fall   HPI  Judy Conner is a 37 y.o. female G6, P3 currently estimated to be approximately [redacted] weeks pregnant who presents today for evaluation of vaginal bleeding.  Patient reports that she tripped over something on the ground last night while she was getting up to use the bathroom and fell, striking her right arm and her left shin.  She notes that this morning she had a small amount of vaginal bleeding without clots.  She denies any pain in her abdomen.  She reports that she has a history of 1 ectopic pregnancy.  She has not had an ultrasound for this pregnancy yet.  No dizziness.  No nausea or vomiting.  Patient Active Problem List   Diagnosis Date Noted   Dental abscess 06/24/2022   Prediabetes 10/21/2021   Anemia due to acquired thiamine deficiency 02/10/2021   Dietary folate deficiency anemia 01/30/2021   Thiamine deficiency 08/20/2020   Vitamin B12 deficiency anemia due to intrinsic factor deficiency 08/15/2020   Oral candidiasis 08/15/2020   Iron deficiency 08/15/2020   Vitamin D deficiency 06/30/2020   Dysmenorrhea, unspecified 12/12/2019   History of umbilical hernia 12/12/2019   Gastroesophageal reflux disease 12/11/2019          Physical Exam   Triage Vital Signs: ED Triage Vitals  Encounter Vitals Group     BP 04/12/23 1207 116/83     Systolic BP Percentile --      Diastolic BP Percentile --      Pulse Rate 04/12/23 1207 96     Resp 04/12/23 1207 16     Temp 04/12/23 1207 98.2 F (36.8 C)     Temp Source 04/12/23 1207 Oral     SpO2 04/12/23 1207 100 %     Weight --      Height --      Head Circumference --      Peak Flow --      Pain Score 04/12/23 1205 9     Pain Loc --      Pain Education --      Exclude from Growth Chart --     Most recent vital signs: Vitals:    04/12/23 1207  BP: 116/83  Pulse: 96  Resp: 16  Temp: 98.2 F (36.8 C)  SpO2: 100%    Physical Exam Vitals and nursing note reviewed.  Constitutional:      General: Awake and alert. No acute distress.    Appearance: Normal appearance. The patient is normal weight.  HENT:     Head: Normocephalic and atraumatic.     Mouth: Mucous membranes are moist.  Eyes:     General: PERRL. Normal EOMs        Right eye: No discharge.        Left eye: No discharge.     Conjunctiva/sclera: Conjunctivae normal.  Cardiovascular:     Rate and Rhythm: Normal rate and regular rhythm.     Pulses: Normal pulses.  Pulmonary:     Effort: Pulmonary effort is normal. No respiratory distress.     Breath sounds: Normal breath sounds.  Abdominal:     Abdomen is soft. There is no abdominal tenderness. No rebound or guarding. No distention. Musculoskeletal:        General: No swelling. Normal range of  motion.     Cervical back: Normal range of motion and neck supple.  Skin:    General: Skin is warm and dry.     Capillary Refill: Capillary refill takes less than 2 seconds.     Findings: No rash.  Neurological:     Mental Status: The patient is awake and alert.      ED Results / Procedures / Treatments   Labs (all labs ordered are listed, but only abnormal results are displayed) Labs Reviewed  CBC WITH DIFFERENTIAL/PLATELET - Abnormal; Notable for the following components:      Result Value   WBC 10.9 (*)    RDW 15.6 (*)    All other components within normal limits  HCG, QUANTITATIVE, PREGNANCY - Abnormal; Notable for the following components:   hCG, Beta Chain, Quant, S 58,288 (*)    All other components within normal limits  URINALYSIS, ROUTINE W REFLEX MICROSCOPIC - Abnormal; Notable for the following components:   Color, Urine YELLOW (*)    APPearance CLEAR (*)    All other components within normal limits  POC URINE PREG, ED - Abnormal; Notable for the following components:   Preg  Test, Ur Positive (*)    All other components within normal limits  WET PREP, GENITAL  BASIC METABOLIC PANEL  ABO/RH     EKG     RADIOLOGY I independently reviewed and interpreted imaging and agree with radiologists findings.     PROCEDURES:  Critical Care performed:   Procedures   MEDICATIONS ORDERED IN ED: Medications - No data to display   IMPRESSION / MDM / ASSESSMENT AND PLAN / ED COURSE  I reviewed the triage vital signs and the nursing notes.   Differential diagnosis includes, but is not limited to, subchorionic hemorrhage, implantation bleeding, inevitable abortion, spontaneous abortion.  Patient is awake and alert, hemodynamically stable and afebrile.  She is nontoxic in appearance.  She has no reproducible abdominal tenderness.  Labs obtained are reassuring, Rh is positive, no need for RhoGAM.  hCG is expectedly elevated to 58,000.  Ultrasound obtained reveals a viable intrauterine pregnancy measuring 6 weeks and 2 days.  She has small subchorionic hemorrhages as well.  This is likely the source of her bleeding.  This was discussed with the patient.  Recommended pelvic rest.  Ultrasound also revealed an asymmetrically enlarged right ovary with a corpus luteum.  She has no adnexal tenderness or tenderness in her right lower quadrant.  I do not suspect torsion.  At the time of discharge, patient had requested testing for BV, reporting that she has had this in the past.  She declines STD testing, reports that she does not have concern for STD and does not wish to be tested.  She understands the risk of delayed diagnosis, still does not want this to be tested.  She also was requesting to be called with the results of her wet prep if positive. Wet prep is negative.  Discussed return precautions and outpatient follow up. Patient understands and agrees with plan. She was discharged in stable condition.  Patient's presentation is most consistent with acute complicated  illness / injury requiring diagnostic workup.   Clinical Course as of 04/12/23 1813  Tue Apr 12, 2023  1625 ABO/RH(D): O POS Performed at [facility], [address] [place], [place] [address]  No need for rhogam [JP]  1710 Upon reevaluation, patient reports that she has concern for BV and would like to be tested for this.  She describes the sensation is slightly itchy.  She does not want to be tested for gonorrhea or chlamydia or any other STD [JP]    Clinical Course User Index [JP] Brietta Manso, Herb Grays, PA-C     FINAL CLINICAL IMPRESSION(S) / ED DIAGNOSES   Final diagnoses:  Vaginal bleeding in pregnancy  Subchorionic hemorrhage in first trimester     Rx / DC Orders   ED Discharge Orders     None        Note:  This document was prepared using Dragon voice recognition software and may include unintentional dictation errors.   Jackelyn Hoehn, PA-C 04/12/23 1814    Merwyn Katos, MD 04/13/23 [phone]

## 2023-04-25 ENCOUNTER — Encounter: Payer: Self-pay | Admitting: Family Medicine

## 2023-04-25 ENCOUNTER — Ambulatory Visit: Payer: Medicaid Other | Admitting: Family Medicine

## 2023-04-25 DIAGNOSIS — B379 Candidiasis, unspecified: Secondary | ICD-10-CM

## 2023-04-25 DIAGNOSIS — Z113 Encounter for screening for infections with a predominantly sexual mode of transmission: Secondary | ICD-10-CM

## 2023-04-25 LAB — WET PREP FOR TRICH, YEAST, CLUE
Trichomonas Exam: NEGATIVE
Yeast Exam: NEGATIVE

## 2023-04-25 MED ORDER — CLOTRIMAZOLE 1 % VA CREA
1.0000 | TOPICAL_CREAM | Freq: Every day | VAGINAL | Status: AC
Start: 2023-04-25 — End: 2023-05-02

## 2023-04-25 NOTE — Progress Notes (Signed)
[facility] Department  STI clinic/screening visit [address] [place] [address] [phone]  Subjective:  Judy Conner is a 37 y.o. female being seen today for an STI screening visit. The patient reports they do have symptoms.  Patient reports that they do not desire a pregnancy in the next year.   They reported they are not interested in discussing contraception today.    Patient's last menstrual period was 02/25/2023 (exact date).  Patient has the following medical conditions:   Patient Active Problem List   Diagnosis Date Noted   Dental abscess 06/24/2022   Prediabetes 10/21/2021   Anemia due to acquired thiamine deficiency 02/10/2021   Dietary folate deficiency anemia 01/30/2021   Thiamine deficiency 08/20/2020   Vitamin B12 deficiency anemia due to intrinsic factor deficiency 08/15/2020   Oral candidiasis 08/15/2020   Iron deficiency 08/15/2020   Vitamin D deficiency 06/30/2020   Dysmenorrhea, unspecified 12/12/2019   History of umbilical hernia 12/12/2019   Gastroesophageal reflux disease 12/11/2019    Chief Complaint  Patient presents with   SEXUALLY TRANSMITTED DISEASE    Screening    HPI  Patient reports to clinic as a walk in asking for wet prep. States she was seen in ER on 9/24 and the wet prep was negative but she endorses itching, discharge and odor. Denies new partner. Reports she is pregnant  Does the patient using douching products? No  Last HIV test per patient/review of record was No results found for: "HMHIVSCREEN"  Lab Results  Component Value Date   HIV NON-REACTIVE 01/30/2021     Last HEPC test per patient/review of record was No results found for: "HMHEPCSCREEN" No components found for: "HEPC"   Last HEPB test per patient/review of record was No components found for: "HMHEPBSCREEN" No components found for: "HEPC"   Patient reports last pap was No results found for: "DIAGPAP" No results found for:  "SPECADGYN"  Screening for MPX risk: Does the patient have an unexplained rash? No Is the patient MSM? No Does the patient endorse multiple sex partners or anonymous sex partners? No Did the patient have close or sexual contact with a person diagnosed with MPX? No Has the patient traveled outside the [place] where MPX is endemic? No Is there a high clinical suspicion for MPX-- evidenced by one of the following No  -Unlikely to be chickenpox  -Lymphadenopathy  -Rash that present in same phase of evolution on any given body part See flowsheet for further details and programmatic requirements.   Immunization history:  Immunization History  Administered Date(s) Administered   Influenza,inj,Quad PF,6+ Mos 06/24/2022   Influenza-Unspecified 05/19/2020   PFIZER(Purple Top)SARS-COV-2 Vaccination 05/19/2020, 06/20/2020   PPD Test 02/10/2015   Tdap 05/19/2021     The following portions of the patient's history were reviewed and updated as appropriate: allergies, current medications, past medical history, past social history, past surgical history and problem list.  Objective:  There were no vitals filed for this visit.  Physical Exam Vitals and nursing note reviewed.  Constitutional:      Appearance: Normal appearance.  HENT:     Head: Normocephalic and atraumatic.     Mouth/Throat:     Mouth: Mucous membranes are moist.     Pharynx: Oropharynx is clear. No oropharyngeal exudate or posterior oropharyngeal erythema.  Pulmonary:     Effort: Pulmonary effort is normal.  Abdominal:     General: Abdomen is flat.     Palpations: There is no mass.  Tenderness: There is no abdominal tenderness. There is no rebound.  Genitourinary:    General: Normal vulva.     Exam position: Lithotomy position.     Pubic Area: No rash or pubic lice.      Labia:        Right: No rash or lesion.        Left: No rash or lesion.      Vagina: Vaginal discharge present. No erythema, bleeding or lesions.      Cervix: No cervical motion tenderness, discharge, friability, lesion or erythema.     Uterus: Normal.      Adnexa: Right adnexa normal and left adnexa normal.     Rectum: Normal.     Comments: pH = 4 Lymphadenopathy:     Head:     Right side of head: No preauricular or posterior auricular adenopathy.     Left side of head: No preauricular or posterior auricular adenopathy.     Cervical: No cervical adenopathy.     Upper Body:     Right upper body: No supraclavicular, axillary or epitrochlear adenopathy.     Left upper body: No supraclavicular, axillary or epitrochlear adenopathy.     Lower Body: No right inguinal adenopathy. No left inguinal adenopathy.  Skin:    General: Skin is warm and dry.     Findings: No rash.  Neurological:     Mental Status: She is alert and oriented to person, place, and time.      Assessment and Plan:  Judy Conner is a 37 y.o. female presenting to the [facility] Department for STI screening  1. Screening for venereal disease  - WET PREP FOR TRICH, YEAST, CLUE - Chlamydia/Gonorrhea [facility]  2. Yeast detected -pt reports severe itching and feels distressed over this -wet prep did not show yeast- however it showed "multiple bacteria"- pt states "I really really need something for this" -offered clotrimazole to see if this can decrease itching -also discussed that both Zyrtec or benadryl are safe in pregnancy and could offer relief  -strongly encouraged patient to make an appointment for  a new OB visit   - clotrimazole (GYNE-LOTRIMIN) 1 % vaginal cream; Place 1 Applicatorful vaginally at bedtime for 7 days.   Patient accepted all screenings including vaginal CT/GC and wet prep. Patient meets criteria for HepB screening? No. Ordered? not applicable Patient meets criteria for HepC screening? No. Ordered? not applicable  Treat wet prep per standing order Discussed time line for [facility] results and that patient will be called  with positive results and encouraged patient to call if she had not heard in 2 weeks.  Counseled to return or seek care for continued or worsening symptoms Recommended repeat testing in 3 months with positive results. Recommended condom use with all sex   Return if symptoms worsen or fail to improve.  Future Appointments  Date Time Provider Department Center  04/26/2023  3:15 PM Allie Bossier, MD [facility] None   Total time spent 30 minutes  Lenice Llamas, [place]

## 2023-04-25 NOTE — Progress Notes (Signed)
Pt is here for STD screening.  Wet mount results reviewed.  The patient was dispensed Clotrimazole 1% vaginal cream today. I provided counseling today regarding the medication. We discussed the medication, the side effects and when to call clinic. Patient given the opportunity to ask questions. Questions answered.  Condoms declined.  Windle Guard, RN

## 2023-04-26 ENCOUNTER — Other Ambulatory Visit (HOSPITAL_COMMUNITY)
Admission: RE | Admit: 2023-04-26 | Discharge: 2023-04-26 | Disposition: A | Discharge status: Home / Self Care | Payer: Medicaid Other | Source: Ambulatory Visit | Attending: Obstetrics & Gynecology | Admitting: Obstetrics & Gynecology

## 2023-04-26 ENCOUNTER — Ambulatory Visit (INDEPENDENT_AMBULATORY_CARE_PROVIDER_SITE_OTHER): Payer: Medicaid Other | Admitting: Obstetrics & Gynecology

## 2023-04-26 ENCOUNTER — Encounter: Payer: Self-pay | Admitting: Obstetrics & Gynecology

## 2023-04-26 VITALS — BP 114/68 | HR 81 | Ht 65.0 in | Wt 216.0 lb

## 2023-04-26 DIAGNOSIS — N898 Other specified noninflammatory disorders of vagina: Secondary | ICD-10-CM | POA: Insufficient documentation

## 2023-04-26 DIAGNOSIS — O09529 Supervision of elderly multigravida, unspecified trimester: Secondary | ICD-10-CM

## 2023-04-26 DIAGNOSIS — E669 Obesity, unspecified: Secondary | ICD-10-CM

## 2023-04-26 DIAGNOSIS — Z124 Encounter for screening for malignant neoplasm of cervix: Secondary | ICD-10-CM | POA: Insufficient documentation

## 2023-04-26 DIAGNOSIS — Z98891 History of uterine scar from previous surgery: Secondary | ICD-10-CM

## 2023-04-26 DIAGNOSIS — O09521 Supervision of elderly multigravida, first trimester: Secondary | ICD-10-CM | POA: Diagnosis not present

## 2023-04-26 DIAGNOSIS — Z09 Encounter for follow-up examination after completed treatment for conditions other than malignant neoplasm: Secondary | ICD-10-CM | POA: Diagnosis not present

## 2023-04-26 DIAGNOSIS — Z3400 Encounter for supervision of normal first pregnancy, unspecified trimester: Secondary | ICD-10-CM | POA: Insufficient documentation

## 2023-04-26 DIAGNOSIS — O99210 Obesity complicating pregnancy, unspecified trimester: Secondary | ICD-10-CM

## 2023-04-26 DIAGNOSIS — O09899 Supervision of other high risk pregnancies, unspecified trimester: Secondary | ICD-10-CM | POA: Insufficient documentation

## 2023-04-26 DIAGNOSIS — Z3A01 Less than 8 weeks gestation of pregnancy: Secondary | ICD-10-CM

## 2023-04-26 DIAGNOSIS — E559 Vitamin D deficiency, unspecified: Secondary | ICD-10-CM

## 2023-04-26 DIAGNOSIS — O99213 Obesity complicating pregnancy, third trimester: Secondary | ICD-10-CM | POA: Insufficient documentation

## 2023-04-26 MED ORDER — ASPIRIN 81 MG PO TBEC: DELAYED_RELEASE_TABLET | ORAL | 12 refills | Status: DC

## 2023-04-26 NOTE — Progress Notes (Signed)
GYNECOLOGY PROGRESS NOTE  Subjective:    Patient ID: Judy Conner, female    DOB: February 10, 1986, 37 y.o.   MRN: [id number]  HPI  Patient is a 37 y.o. [id number] here as a new patient for follow up after ER visit 04/12/2023. She was seen there with bleeding in early pregnancy along with a fall in the bathroom. An ultrasound done then showed a fetus at 6 weeks 2 days with a small SCH. She no longer has bleeding. She does have vaginal itching and a discharge.  The following portions of the patient's history were reviewed and updated as appropriate: allergies, current medications, past family history, past medical history, past social history, past surgical history, and problem list.  Review of Systems Pertinent items are noted in HPI.   Objective:   Blood pressure 114/68, pulse 81, height 5\' 5"  (1.651 m), weight 216 lb (98 kg), last menstrual period 02/25/2023. Body mass index is 35.94 kg/m. Well nourished, well hydrated Black female, no apparent distress She is ambulating and conversing normally. Speculum exam reveals a closed cervix, white d/w c/w yeast I did a bedside ultrasound and saw a normal FHR around 150s.  Assessment:   1. Supervision of normal first pregnancy, antepartum   2. Antepartum multigravida of advanced maternal age   37. Obesity in pregnancy, antepartum      Plan:   1. Supervision of other high risk pregnancy   2. Antepartum multigravida of advanced maternal age - will offer genetic testing at [facility] visit  3. Obesity in pregnancy, antepartum  - HgB A1c - Protein / creatinine ratio, urine - Comp Met (CMET)

## 2023-04-27 ENCOUNTER — Ambulatory Visit: Payer: Self-pay

## 2023-04-27 VITALS — Wt 216.0 lb

## 2023-04-27 DIAGNOSIS — Z3480 Encounter for supervision of other normal pregnancy, unspecified trimester: Secondary | ICD-10-CM | POA: Insufficient documentation

## 2023-04-27 DIAGNOSIS — K219 Gastro-esophageal reflux disease without esophagitis: Secondary | ICD-10-CM

## 2023-04-27 DIAGNOSIS — O99210 Obesity complicating pregnancy, unspecified trimester: Secondary | ICD-10-CM

## 2023-04-27 DIAGNOSIS — Z3689 Encounter for other specified antenatal screening: Secondary | ICD-10-CM

## 2023-04-27 DIAGNOSIS — Z98891 History of uterine scar from previous surgery: Secondary | ICD-10-CM

## 2023-04-27 DIAGNOSIS — Z8719 Personal history of other diseases of the digestive system: Secondary | ICD-10-CM

## 2023-04-27 DIAGNOSIS — O09529 Supervision of elderly multigravida, unspecified trimester: Secondary | ICD-10-CM

## 2023-04-27 DIAGNOSIS — O0990 Supervision of high risk pregnancy, unspecified, unspecified trimester: Secondary | ICD-10-CM | POA: Insufficient documentation

## 2023-04-27 LAB — COMPREHENSIVE METABOLIC PANEL
ALT: 11 [IU]/L (ref 0–32)
AST: 12 [IU]/L (ref 0–40)
Albumin: 3.9 g/dL (ref 3.9–4.9)
Alkaline Phosphatase: 54 [IU]/L (ref 44–121)
BUN/Creatinine Ratio: 8 — ABNORMAL LOW (ref 9–23)
BUN: 5 mg/dL — ABNORMAL LOW (ref 6–20)
Bilirubin Total: <0.2 mg/dL (ref 0.0–1.2)
CO2: 20 mmol/L (ref 20–29)
Calcium: 9.3 mg/dL (ref 8.7–10.2)
Chloride: 103 mmol/L (ref 96–106)
Creatinine, Ser: 0.64 mg/dL (ref 0.57–1.00)
Globulin, Total: 2.5 g/dL (ref 1.5–4.5)
Glucose: 74 mg/dL (ref 70–99)
Potassium: 3.8 mmol/L (ref 3.5–5.2)
Sodium: 138 mmol/L (ref 134–144)
Total Protein: 6.4 g/dL (ref 6.0–8.5)
eGFR: 117 mL/min/{1.73_m2} (ref >59)

## 2023-04-27 LAB — VITAMIN D 25 HYDROXY (VIT D DEFICIENCY, FRACTURES): Vit D, 25-Hydroxy: 20.5 ng/mL — ABNORMAL LOW (ref 30.0–100.0)

## 2023-04-27 LAB — HEMOGLOBIN A1C
Est. average glucose Bld gHb Est-mCnc: 117 mg/dL
Hgb A1c MFr Bld: 5.7 % — ABNORMAL HIGH (ref 4.8–5.6)

## 2023-04-27 NOTE — Progress Notes (Signed)
New OB Intake  I connected with  Judy Conner on 04/27/23 at  3:15 PM EDT by telephone and verified that I am speaking with the correct person using two identifiers. Nurse is located at [facility] and pt is located at work.  I explained I am completing New OB Intake today. We discussed her EDD of 12/02/2023 that is based on LMP of 02/25/2023. Pt is G6/P3023. I reviewed her allergies, medications, Medical/Surgical/OB history, and appropriate screenings. There are cats in the home.  Pt's kids change the litter box and is aware we do not want her changing it.  Based on history, this is a/an pregnancy uncomplicated .   Patient Active Problem List   Diagnosis Date Noted   Supervision of other normal pregnancy, antepartum 04/27/2023   Antepartum multigravida of advanced maternal age 14/02/2023   Obesity in pregnancy, antepartum 04/26/2023   History of cesarean delivery 04/26/2023   History of umbilical hernia 12/12/2019   Gastroesophageal reflux disease 12/11/2019    Concerns addressed today Pt is anxious to get STI results back b/c she wasn't feeling right down there yesterday.  Adv we should get them back about any time now.  Delivery Plans:  Plans to deliver at [facility].   Anatomy US Explained first scheduled [place] was done 04/12/23 and an anatomy scan will be done at 20 weeks.  Labs Discussed genetic screening with patient. Patient desires genetic testing to be drawn after 10w. Discussed possible labs to be drawn at new OB appointment.  COVID Vaccine Patient has had COVID vaccine.   Social Determinants of Health Food Insecurity: expresses food insecurity. Information given on local food banks. Transportation: Patient denies transportation needs. Childcare: Discussed no children allowed at ultrasound appointments.   First visit review I reviewed new OB appt with pt. I explained she will have ob bloodwork and pap smear/pelvic exam if indicated. Explained pt  will be seen by Dr. Hildred Laser at first visit; encounter routed to appropriate provider.   Loran Senters, [place] 04/27/2023  4:07 PM

## 2023-04-27 NOTE — Patient Instructions (Signed)
Second Trimester of Pregnancy  The second trimester of pregnancy is from week 13 through week 27. This is months 4 through 6 of pregnancy. The second trimester is often a time when you feel your best. Your body has adjusted to being pregnant, and you begin to feel better physically. During the second trimester: Morning sickness has lessened or stopped completely. You may have more energy. You may have an increase in appetite. The second trimester is also a time when the unborn baby (fetus) is growing rapidly. At the end of the sixth month, the fetus may be up to 12 inches long and weigh about 1 pounds. You will likely begin to feel the baby move (quickening) between 16 and 20 weeks of pregnancy. Body changes during your second trimester Your body continues to go through many changes during your second trimester. The changes vary and generally return to normal after the baby is born. Physical changes Your weight will continue to increase. You will notice your lower abdomen bulging out. You may begin to get stretch marks on your hips, abdomen, and breasts. Your breasts will continue to grow and to become tender. Dark spots or blotches (chloasma or mask of pregnancy) may develop on your face. A dark line from your belly button to the pubic area (linea nigra) may appear. You may have changes in your hair. These can include thickening of your hair, rapid growth, and changes in texture. Some people also have hair loss during or after pregnancy, or hair that feels dry or thin. Health changes You may develop headaches. You may have heartburn. You may develop constipation. You may develop hemorrhoids or swollen, bulging veins (varicose veins). Your gums may bleed and may be sensitive to brushing and flossing. You may urinate more often because the fetus is pressing on your bladder. You may have back pain. This is caused by: Weight gain. Pregnancy hormones that are relaxing the joints in your  pelvis. A shift in weight and the muscles that support your balance. Follow these instructions at home: Medicines Follow your health care provider's instructions regarding medicine use. Specific medicines may be either safe or unsafe to take during pregnancy. Do not take any medicines unless approved by your health care provider. Take a prenatal vitamin that contains at least 600 micrograms (mcg) of folic acid. Eating and drinking Eat a healthy diet that includes fresh fruits and vegetables, whole grains, good sources of protein such as meat, eggs, or tofu, and low-fat dairy products. Avoid raw meat and unpasteurized juice, milk, and cheese. These carry germs that can harm you and your baby. You may need to take these actions to prevent or treat constipation: Drink enough fluid to keep your urine pale yellow. Eat foods that are high in fiber, such as beans, whole grains, and fresh fruits and vegetables. Limit foods that are high in fat and processed sugars, such as fried or sweet foods. Activity Exercise only as directed by your health care provider. Most people can continue their usual exercise routine during pregnancy. Try to exercise for 30 minutes at least 5 days a week. Stop exercising if you develop contractions in your uterus. Stop exercising if you develop pain or cramping in the lower abdomen or lower back. Avoid exercising if it is very hot or humid or if you are at a high altitude. Avoid heavy lifting. If you choose to, you may have sex unless your health care provider tells you not to. Relieving pain and discomfort Wear a supportive   bra to prevent discomfort from breast tenderness. Take warm sitz baths to soothe any pain or discomfort caused by hemorrhoids. Use hemorrhoid cream if your health care provider approves. Rest with your legs raised (elevated) if you have leg cramps or low back pain. If you develop varicose veins: Wear support hose as told by your health care  provider. Elevate your feet for 15 minutes, 3-4 times a day. Limit salt in your diet. Safety Wear your seat belt at all times when driving or riding in a car. Talk with your health care provider if someone is verbally or physically abusive to you. Lifestyle Do not use hot tubs, steam rooms, or saunas. Do not douche. Do not use tampons or scented sanitary pads. Avoid cat litter boxes and soil used by cats. These carry germs that can cause birth defects in the baby and possibly loss of the fetus by miscarriage or stillbirth. Do not use herbal remedies, alcohol, illegal drugs, or medicines that are not approved by your health care provider. Chemicals in these products can harm your baby. Do not use any products that contain nicotine or tobacco, such as cigarettes, e-cigarettes, and chewing tobacco. If you need help quitting, ask your health care provider. General instructions During a routine prenatal visit, your health care provider will do a physical exam and other tests. He or she will also discuss your overall health. Keep all follow-up visits. This is important. Ask your health care provider for a referral to a local prenatal education class. Ask for help if you have counseling or nutritional needs during pregnancy. Your health care provider can offer advice or refer you to specialists for help with various needs. Where to find more information American Pregnancy Association: americanpregnancy.org American College of Obstetricians and Gynecologists: acog.org/en/Womens%20Health/Pregnancy Office on Women's Health: womenshealth.gov/pregnancy Contact a health care provider if you have: A headache that does not go away when you take medicine. Vision changes or you see spots in front of your eyes. Mild pelvic cramps, pelvic pressure, or nagging pain in the abdominal area. Persistent nausea, vomiting, or diarrhea. A bad-smelling vaginal discharge or foul-smelling urine. Pain when you  urinate. Sudden or extreme swelling of your face, hands, ankles, feet, or legs. A fever. Get help right away if you: Have fluid leaking from your vagina. Have spotting or bleeding from your vagina. Have severe abdominal cramping or pain. Have difficulty breathing. Have chest pain. Have fainting spells. Have not felt your baby move for the time period told by your health care provider. Have new or increased pain, swelling, or redness in an arm or leg. Summary The second trimester of pregnancy is from week 13 through week 27 (months 4 through 6). Do not use herbal remedies, alcohol, illegal drugs, or medicines that are not approved by your health care provider. Chemicals in these products can harm your baby. Exercise only as directed by your health care provider. Most people can continue their usual exercise routine during pregnancy. Keep all follow-up visits. This is important. This information is not intended to replace advice given to you by your health care provider. Make sure you discuss any questions you have with your health care provider. Document Revised: 12/12/2019 Document Reviewed: 10/18/2019 Elsevier Patient Education  2024 [employer] First Trimester of Pregnancy  The first trimester of pregnancy starts on the first day of your last menstrual period until the end of week 12. This is also called months 1 through 3 of pregnancy. Body changes during your first trimester Your   body goes through many changes during pregnancy. The changes usually return to normal after your baby is born. Physical changes You may gain or lose weight. Your breasts may grow larger and hurt. The area around your nipples may get darker. Dark spots or blotches may develop on your face. You may have changes in your hair. Health changes You may feel like you might vomit (nauseous), and you may vomit. You may have heartburn. You may have headaches. You may have trouble pooping (constipation). Your  gums may bleed. Other changes You may get tired easily. You may pee (urinate) more often. Your menstrual periods will stop. You may not feel hungry. You may want to eat certain kinds of food. You may have changes in your emotions from day to day. You may have more dreams. Follow these instructions at home: Medicines Take over-the-counter and prescription medicines only as told by your doctor. Some medicines are not safe during pregnancy. Take a prenatal vitamin that contains at least 600 micrograms (mcg) of folic acid. Eating and drinking Eat healthy meals that include: Fresh fruits and vegetables. Whole grains. Good sources of protein, such as meat, eggs, or tofu. Low-fat dairy products. Avoid raw meat and unpasteurized juice, milk, and cheese. If you feel like you may vomit, or you vomit: Eat 4 or 5 small meals a day instead of 3 large meals. Try eating a few soda crackers. Drink liquids between meals instead of during meals. You may need to take these actions to prevent or treat trouble pooping: Drink enough fluids to keep your pee (urine) pale yellow. Eat foods that are high in fiber. These include beans, whole grains, and fresh fruits and vegetables. Limit foods that are high in fat and sugar. These include fried or sweet foods. Activity Exercise only as told by your doctor. Most people can do their usual exercise routine during pregnancy. Stop exercising if you have cramps or pain in your lower belly (abdomen) or low back. Do not exercise if it is too hot or too humid, or if you are in a place of great height (high altitude). Avoid heavy lifting. If you choose to, you may have sex unless your doctor tells you not to. Relieving pain and discomfort Wear a good support bra if your breasts are sore. Rest with your legs raised (elevated) if you have leg cramps or low back pain. If you have bulging veins (varicose veins) in your legs: Wear support hose as told by your  doctor. Raise your feet for 15 minutes, 3-4 times a day. Limit salt in your food. Safety Wear your seat belt at all times when you are in a car. Talk with your doctor if someone is hurting you or yelling at you. Talk with your doctor if you are feeling sad or have thoughts of hurting yourself. Lifestyle Do not use hot tubs, steam rooms, or saunas. Do not douche. Do not use tampons or scented sanitary pads. Do not use herbal medicines, illegal drugs, or medicines that are not approved by your doctor. Do not drink alcohol. Do not smoke or use any products that contain nicotine or tobacco. If you need help quitting, ask your doctor. Avoid cat litter boxes and soil that is used by cats. These carry germs that can cause harm to the baby and can cause a loss of your baby by miscarriage or stillbirth. General instructions Keep all follow-up visits. This is important. Ask for help if you need counseling or if you need help with   nutrition. Your doctor can give you advice or tell you where to go for help. Visit your dentist. At home, brush your teeth with a soft toothbrush. Floss gently. Write down your questions. Take them to your prenatal visits. Where to find more information American Pregnancy Association: americanpregnancy.org American College of Obstetricians and Gynecologists: www.acog.org Office on Women's Health: womenshealth.gov/pregnancy Contact a doctor if: You are dizzy. You have a fever. You have mild cramps or pressure in your lower belly. You have a nagging pain in your belly area. You continue to feel like you may vomit, you vomit, or you have watery poop (diarrhea) for 24 hours or longer. You have a bad-smelling fluid coming from your vagina. You have pain when you pee. You are exposed to a disease that spreads from person to person, such as chickenpox, measles, Zika virus, HIV, or hepatitis. Get help right away if: You have spotting or bleeding from your vagina. You have  very bad belly cramping or pain. You have shortness of breath or chest pain. You have any kind of injury, such as from a fall or a car crash. You have new or increased pain, swelling, or redness in an arm or leg. Summary The first trimester of pregnancy starts on the first day of your last menstrual period until the end of week 12 (months 1 through 3). Eat 4 or 5 small meals a day instead of 3 large meals. Do not smoke or use any products that contain nicotine or tobacco. If you need help quitting, ask your doctor. Keep all follow-up visits. This information is not intended to replace advice given to you by your health care provider. Make sure you discuss any questions you have with your health care provider. Document Revised: 12/12/2019 Document Reviewed: 10/18/2019 Elsevier Patient Education  2024 [employer] Commonly Asked Questions During Pregnancy  Cats: A parasite can be excreted in cat feces.  To avoid exposure you need to have another person empty the little box.  If you must empty the litter box you will need to wear gloves.  Wash your hands after handling your cat.  This parasite can also be found in raw or undercooked meat so this should also be avoided.  Colds, Sore Throats, Flu: Please check your medication sheet to see what you can take for symptoms.  If your symptoms are unrelieved by these medications please call the office.  Dental Work: Most any dental work your dentist recommends is permitted.  X-rays should only be taken during the first trimester if absolutely necessary.  Your abdomen should be shielded with a lead apron during all x-rays.  Please notify your provider prior to receiving any x-rays.  Novocaine is fine; gas is not recommended.  If your dentist requires a note from us prior to dental work please call the office and we will provide one for you.  Exercise: Exercise is an important part of staying healthy during your pregnancy.  You may continue most exercises  you were accustomed to prior to pregnancy.  Later in your pregnancy you will most likely notice you have difficulty with activities requiring balance like riding a bicycle.  It is important that you listen to your body and avoid activities that put you at a higher risk of falling.  Adequate rest and staying well hydrated are a must!  If you have questions about the safety of specific activities ask your provider.    Exposure to Children with illness: Try to avoid obvious exposure; report any   symptoms to us when noted,  If you have chicken pos, red measles or mumps, you should be immune to these diseases.   Please do not take any vaccines while pregnant unless you have checked with your OB provider.  Fetal Movement: After 28 weeks we recommend you do "kick counts" twice daily.  Lie or sit down in a calm quiet environment and count your baby movements "kicks".  You should feel your baby at least 10 times per hour.  If you have not felt 10 kicks within the first hour get up, walk around and have something sweet to eat or drink then repeat for an additional hour.  If count remains less than 10 per hour notify your provider.  Fumigating: Follow your pest control agent's advice as to how long to stay out of your home.  Ventilate the area well before re-entering.  Hemorrhoids:   Most over-the-counter preparations can be used during pregnancy.  Check your medication to see what is safe to use.  It is important to use a stool softener or fiber in your diet and to drink lots of liquids.  If hemorrhoids seem to be getting worse please call the office.   Hot Tubs:  Hot tubs Jacuzzis and saunas are not recommended while pregnant.  These increase your internal body temperature and should be avoided.  Intercourse:  Sexual intercourse is safe during pregnancy as long as you are comfortable, unless otherwise advised by your provider.  Spotting may occur after intercourse; report any bright red bleeding that is heavier  than spotting.  Labor:  If you know that you are in labor, please go to the hospital.  If you are unsure, please call the office and let us help you decide what to do.  Lifting, straining, etc:  If your job requires heavy lifting or straining please check with your provider for any limitations.  Generally, you should not lift items heavier than that you can lift simply with your hands and arms (no back muscles)  Painting:  Paint fumes do not harm your pregnancy, but may make you ill and should be avoided if possible.  Latex or water based paints have less odor than oils.  Use adequate ventilation while painting.  Permanents & Hair Color:  Chemicals in hair dyes are not recommended as they cause increase hair dryness which can increase hair loss during pregnancy.  " Highlighting" and permanents are allowed.  Dye may be absorbed differently and permanents may not hold as well during pregnancy.  Sunbathing:  Use a sunscreen, as skin burns easily during pregnancy.  Drink plenty of fluids; avoid over heating.  Tanning Beds:  Because their possible side effects are still unknown, tanning beds are not recommended.  Ultrasound Scans:  Routine ultrasounds are performed at approximately 20 weeks.  You will be able to see your baby's general anatomy an if you would like to know the gender this can usually be determined as well.  If it is questionable when you conceived you may also receive an ultrasound early in your pregnancy for dating purposes.  Otherwise ultrasound exams are not routinely performed unless there is a medical necessity.  Although you can request a scan we ask that you pay for it when conducted because insurance does not cover " patient request" scans.  Work: If your pregnancy proceeds without complications you may work until your due date, unless your physician or employer advises otherwise.  Round Ligament Pain/Pelvic Discomfort:  Sharp, shooting pains not associated   with bleeding are  fairly common, usually occurring in the second trimester of pregnancy.  They tend to be worse when standing up or when you remain standing for long periods of time.  These are the result of pressure of certain pelvic ligaments called "round ligaments".  Rest, Tylenol and heat seem to be the most effective relief.  As the womb and fetus grow, they rise out of the pelvis and the discomfort improves.  Please notify the office if your pain seems different than that described.  It may represent a more serious condition.  Common Medications Safe in Pregnancy  Acne:      Constipation:  Benzoyl Peroxide     Colace  Clindamycin      Dulcolax Suppository  Topica Erythromycin     Fibercon  Salicylic Acid      Metamucil         Miralax AVOID:        Senakot   Accutane    Cough:  Retin-A       Cough Drops  Tetracycline      Phenergan w/ Codeine if Rx  Minocycline      Robitussin (Plain & DM)  Antibiotics:     Crabs/Lice:  Ceclor       RID  Cephalosporins    AVOID:  E-Mycins      Kwell  Keflex  Macrobid/Macrodantin   Diarrhea:  Penicillin      Kao-Pectate  Zithromax      Imodium AD         PUSH FLUIDS AVOID:       Cipro     Fever:  Tetracycline      Tylenol (Regular or Extra  Minocycline       Strength)  Levaquin      Extra Strength-Do not          Exceed 8 tabs/24 hrs Caffeine:        <200mg/day (equiv. To 1 cup of coffee or  approx. 3 12 oz sodas)         Gas: Cold/Hayfever:       Gas-X  Benadryl      Mylicon  Claritin       Phazyme  **Claritin-D        Chlor-Trimeton    Headaches:  Dimetapp      ASA-Free Excedrin  Drixoral-Non-Drowsy     Cold Compress  Mucinex (Guaifenasin)     Tylenol (Regular or Extra  Sudafed/Sudafed-12 Hour     Strength)  **Sudafed PE Pseudoephedrine   Tylenol Cold & Sinus     Vicks Vapor Rub  Zyrtec  **AVOID if Problems With Blood Pressure         Heartburn: Avoid lying down for at least 1 hour after meals  Aciphex      Maalox     Rash:  Milk of  Magnesia     Benadryl    Mylanta       1% Hydrocortisone Cream  Pepcid  Pepcid Complete   Sleep Aids:  Prevacid      Ambien   Prilosec       Benadryl  Rolaids       Chamomile Tea  Tums (Limit 4/day)     Unisom         Tylenol PM         Warm milk-add vanilla or  Hemorrhoids:       Sugar for taste  Anusol/Anusol H.C.  (RX: Analapram 2.5%)  Sugar Substitutes:    Hydrocortisone OTC     Ok in moderation  Preparation H      Tucks        Vaseline lotion applied to tissue with wiping    Herpes:     Throat:  Acyclovir      Oragel  Famvir  Valtrex     Vaccines:         Flu Shot Leg Cramps:       *Gardasil  Benadryl      Hepatitis A         Hepatitis B Nasal Spray:       Pneumovax  Saline Nasal Spray     Polio Booster         Tetanus Nausea:       Tuberculosis test or PPD  Vitamin B6 25 mg TID   AVOID:    Dramamine      *Gardasil  Emetrol       Live Poliovirus  Ginger Root 250 mg QID    MMR (measles, mumps &  High Complex Carbs @ Bedtime    rebella)  Sea Bands-Accupressure    Varicella (Chickenpox)  Unisom 1/2 tab TID     *No known complications           If received before Pain:         Known pregnancy;   Darvocet       Resume series after  Lortab        Delivery  Percocet    Yeast:   Tramadol      Femstat  Tylenol 3      Gyne-lotrimin  Ultram       Monistat  Vicodin           MISC:         All Sunscreens           Hair Coloring/highlights          Insect Repellant's          (Including DEET)         Mystic Tans  

## 2023-04-28 LAB — CERVICOVAGINAL ANCILLARY ONLY
Bacterial Vaginitis (gardnerella): NEGATIVE
Candida Glabrata: NEGATIVE
Candida Vaginitis: POSITIVE — AB
Comment: NEGATIVE
Comment: NEGATIVE
Comment: NEGATIVE
Comment: NEGATIVE
Trichomonas: NEGATIVE

## 2023-04-28 LAB — PROTEIN / CREATININE RATIO, URINE
Creatinine, Urine: 246.8 mg/dL
Protein, Ur: 13.9 mg/dL
Protein/Creat Ratio: 56 mg/g{creat} (ref 0–200)

## 2023-04-29 LAB — CYTOLOGY - PAP
Chlamydia: NEGATIVE
Comment: NEGATIVE
Comment: NORMAL
Diagnosis: NEGATIVE
Neisseria Gonorrhea: NEGATIVE

## 2023-05-02 ENCOUNTER — Encounter: Payer: Self-pay | Admitting: Obstetrics & Gynecology

## 2023-05-02 ENCOUNTER — Other Ambulatory Visit: Payer: Self-pay | Admitting: Obstetrics & Gynecology

## 2023-05-02 MED ORDER — VITAMIN D (ERGOCALCIFEROL) 1.25 MG (50000 UNIT) PO CAPS: 50000.0000 [IU] | ORAL_CAPSULE | ORAL | 0 refills | Status: DC

## 2023-05-02 NOTE — Progress Notes (Signed)
Vit d prescribed for deficiency

## 2023-05-23 NOTE — Progress Notes (Unsigned)
OBSTETRIC INITIAL PRENATAL VISIT  Subjective:    Judy Conner is being seen today for her first obstetrical visit.  This {is/is not:9024} a planned pregnancy. She is a 37 y.o. [id number] female at [redacted]w[redacted]d gestation, Estimated Date of Delivery: 12/02/23 with Patient's last menstrual period was 02/25/2023 (exact date).,  consistent with *** week sono. Her obstetrical history is significant for {ob risk factors:10154}. Relationship with FOB: {fob:16621}. Patient {does/does not:19097} intend to breast feed. Pregnancy history fully reviewed.    OB History  Gravida Para Term Preterm AB Living  6 3 3  0 2 3  SAB IAB Ectopic Multiple Live Births  0 1 1 0 3    # Outcome Date GA Lbr Len/2nd Weight Sex Type Anes PTL Lv  6 Current           5 IAB 2022     TAB     4 Term 09/18/08    M CS-Unspec  N LIV     Birth Comments: c/s d/t prev c/s  3 Ectopic 2010          2 Term 06/16/07 [redacted]w[redacted]d   M   N LIV     Birth Comments: c/s d/t prev c/s  1 Term 05/04/05    F CS-Unspec  N LIV     Birth Comments: doesn't remember what complications were    Gynecologic History:  Last pap smear was ***.  Results were {Normal/Abnormal:304960160}.  {Actions; denies-reports:120008} h/o abnormal pap smears in the past.  {Actions; denies-reports:120008} history of STIs.  Contraception prior to conception:    Past Medical History:  Diagnosis Date   Anemia    hx low iron    Chronic kidney disease    hx uti none currently   Dysmenorrhea, unspecified 12/12/2019   GERD (gastroesophageal reflux disease)    Headache(784.0)    History of umbilical hernia 12/12/2019   Low serum potassium    Sciatica    Urticaria     Family History  Problem Relation Age of Onset   Healthy Mother    Heart disease Father    Hypertension Father    Crohn's disease Father    Hypertension Sister    Healthy Sister    Healthy Brother    Breast cancer Maternal Grandmother    Diabetes Maternal Grandmother    Clotting disorder Paternal  Uncle     Past Surgical History:  Procedure Laterality Date   CESAREAN SECTION  [phone]   x 3   FINGER FRACTURE SURGERY Left    pinky   WISDOM TOOTH EXTRACTION     [age]    Social History   Socioeconomic History   Marital status: Single    Spouse name: Not on file   Number of children: 3   Years of education: 14   Highest education level: Associate degree: occupational, Scientist, product/process development, or vocational program  Occupational History   Occupation: CNA  Tobacco Use   Smoking status: Some Days    Current packs/day: 0.25    Average packs/day: 0.3 packs/day for 4.0 years (1.0 ttl pk-yrs)    Types: Cigarettes    Passive exposure: Never   Smokeless tobacco: Never   Tobacco comments:    Occ   Vaping Use   Vaping status: Never Used  Substance and Sexual Activity   Alcohol use: Not Currently    Comment: occasional   Drug use: No   Sexual activity: Not Currently    Partners: Male    Birth control/protection: None  Other  Topics Concern   Not on file  Social History Narrative   Divorced- lives with 3 kids and single parent since 2019   Social Determinants of Health   Financial Resource Strain: High Risk (04/27/2023)   Overall Financial Resource Strain (CARDIA)    Difficulty of Paying Living Expenses: Very hard  Food Insecurity: Food Insecurity Present (04/27/2023)   Hunger Vital Sign    Worried About Running Out of Food in the Last Year: Often true    Ran Out of Food in the Last Year: Sometimes true  Transportation Needs: No Transportation Needs (04/27/2023)   PRAPARE - Administrator, Civil Service (Medical): No    Lack of Transportation (Non-Medical): No  Physical Activity: Sufficiently Active (04/27/2023)   Exercise Vital Sign    Days of Exercise per Week: 3 days    Minutes of Exercise per Session: 60 min  Stress: No Stress Concern Present (04/27/2023)   [employer] of Occupational Health - Occupational Stress Questionnaire    Feeling of Stress : Not at  all  Social Connections: Unknown (04/27/2023)   Social Connection and Isolation Panel [NHANES]    Frequency of Communication with Friends and Family: More than three times a week    Frequency of Social Gatherings with Friends and Family: Never    Attends Religious Services: Never    Database administrator or Organizations: No    Attends Banker Meetings: Never    Marital Status: Not on file  Intimate Partner Violence: Not At Risk (04/27/2023)   Humiliation, Afraid, Rape, and Kick questionnaire    Fear of Current or Ex-Partner: No    Emotionally Abused: No    Physically Abused: No    Sexually Abused: No    Current Outpatient Medications on File Prior to Visit  Medication Sig Dispense Refill   Vitamin D, Ergocalciferol, (DRISDOL) 1.25 MG (50000 UNIT) CAPS capsule Take 1 capsule (50,000 Units total) by mouth every 7 (seven) days. 8 capsule 0   aspirin EC 81 MG tablet Take 1 pill daily, starting at [redacted] weeks EGA (Patient not taking: Reported on 04/27/2023) 90 tablet 12   cephALEXin (KEFLEX) 500 MG capsule Take 1 capsule (500 mg total) by mouth 2 (two) times daily. (Patient not taking: Reported on 04/25/2023) 14 capsule 0   famotidine (PEPCID) 20 MG tablet Take 1 tablet (20 mg total) by mouth 2 (two) times daily. 60 tablet 0   Ferric Maltol (ACCRUFER) 30 MG CAPS Take 1 capsule by mouth in the morning and at bedtime. (Patient not taking: Reported on 04/25/2023) 180 capsule 1   folic acid (FOLVITE) 1 MG tablet Take 1 tablet (1 mg total) by mouth daily. (Patient not taking: Reported on 04/25/2023) 90 tablet 1   HYDROcodone-acetaminophen (NORCO) 10-325 MG tablet Take 1 tablet by mouth every 8 (eight) hours as needed. (Patient not taking: Reported on 04/25/2023) 20 tablet 0   ondansetron (ZOFRAN-ODT) 4 MG disintegrating tablet Take 1 tablet (4 mg total) by mouth every 6 (six) hours as needed for nausea or vomiting. (Patient not taking: Reported on 04/25/2023) 20 tablet 0   pantoprazole  (PROTONIX) 40 MG tablet Take 1 tablet (40 mg total) by mouth daily. (Patient not taking: Reported on 04/25/2023) 90 tablet 1   potassium chloride SA (KLOR-CON) 20 MEQ tablet Take 1 tablet (20 mEq total) by mouth 3 (three) times daily. (Patient not taking: Reported on 04/25/2023) 270 tablet 0   Prenatal Vit-Fe Fumarate-FA (PRENATAL VITAMINS PO) Take by  mouth.     thiamine (VITAMIN B1) 100 MG tablet Take 1 tablet (100 mg total) by mouth every other day. (Patient not taking: Reported on 04/25/2023) 45 tablet 1   No current facility-administered medications on file prior to visit.    Allergies  Allergen Reactions   Fish Oil Anaphylaxis    Fruits   Fruit & Vegetable Daily [Nutritional Supplements] Anaphylaxis    Fruits   Fentanyl     Pt states that she has never had Fentanyl but her sister and her father have both had a reaction to Fentanyl      Review of Systems General: Not Present- Fever, Weight Loss and Weight Gain. Skin: Not Present- Rash. HEENT: Not Present- Blurred Vision, Headache and Bleeding Gums. Respiratory: Not Present- Difficulty Breathing. Breast: Not Present- Breast Mass. Cardiovascular: Not Present- Chest Pain, Elevated Blood Pressure, Fainting / Blacking Out and Shortness of Breath. Gastrointestinal: Not Present- Abdominal Pain, Constipation, Nausea and Vomiting. Female Genitourinary: Not Present- Frequency, Painful Urination, Pelvic Pain, Vaginal Bleeding, Vaginal Discharge, Contractions, regular, Fetal Movements Decreased, Urinary Complaints and Vaginal Fluid. Musculoskeletal: Not Present- Back Pain and Leg Cramps. Neurological: Not Present- Dizziness. Psychiatric: Not Present- Depression.     Objective:   Last menstrual period 02/25/2023.  There is no height or weight on file to calculate BMI.  General Appearance:    Alert, cooperative, no distress, appears stated age  Head:    Normocephalic, without obvious abnormality, atraumatic  Eyes:    PERRL,  conjunctiva/corneas clear, EOM's intact, both eyes  Ears:    Normal external ear canals, both ears  Nose:   Nares normal, septum midline, mucosa normal, no drainage or sinus tenderness  Throat:   Lips, mucosa, and tongue normal; teeth and gums normal  Neck:   Supple, symmetrical, trachea midline, no adenopathy; thyroid: no enlargement/tenderness/nodules; no carotid bruit or JVD  Back:     Symmetric, no curvature, ROM normal, no CVA tenderness  Lungs:     Clear to auscultation bilaterally, respirations unlabored  Chest Wall:    No tenderness or deformity   Heart:    Regular rate and rhythm, S1 and S2 normal, no murmur, rub or gallop  Breast Exam:    No tenderness, masses, or nipple abnormality  Abdomen:     Soft, non-tender, bowel sounds active all four quadrants, no masses, no organomegaly.  FHT ***  bpm.  Genitalia:    Pelvic:external genitalia normal, vagina without lesions, discharge, or tenderness, rectovaginal septum  normal. Cervix normal in appearance, no cervical motion tenderness, no adnexal masses or tenderness.  Pregnancy positive findings: uterine enlargement: *** wk size, nontender.   Rectal:    Normal external sphincter.  No hemorrhoids appreciated. Internal exam not done.   Extremities:   Extremities normal, atraumatic, no cyanosis or edema  Pulses:   2+ and symmetric all extremities  Skin:   Skin color, texture, turgor normal, no rashes or lesions  Lymph nodes:   Cervical, supraclavicular, and axillary nodes normal  Neurologic:   CNII-XII intact, normal strength, sensation and reflexes throughout     Assessment:   No diagnosis found.  Plan:   Supervision of ***normal/high risk pregnancy  - Initial labs reviewed. - Prenatal vitamins encouraged. - Problem list reviewed and updated. - New OB counseling:  The patient has been given an overview regarding routine prenatal care.  Recommendations regarding diet, weight gain, and exercise in pregnancy were given. - Prenatal  testing, optional genetic testing, and ultrasound use in pregnancy were reviewed.  Traditional genetic screening vs cell-fee DNA genetic screening discussed, including risks and benefits. Testing {requests/ordered/declines:14581}. - Benefits of Breast Feeding were discussed. The patient is encouraged to consider nursing her baby post partum.  There are no diagnoses linked to this encounter.    Follow up in 4 weeks.    Hildred Laser, MD [facility]

## 2023-05-24 ENCOUNTER — Ambulatory Visit (INDEPENDENT_AMBULATORY_CARE_PROVIDER_SITE_OTHER): Payer: Medicaid Other | Admitting: Obstetrics and Gynecology

## 2023-05-24 ENCOUNTER — Encounter: Payer: Self-pay | Admitting: Obstetrics and Gynecology

## 2023-05-24 VITALS — BP 108/64 | HR 84 | Wt 224.0 lb

## 2023-05-24 DIAGNOSIS — Z1379 Encounter for other screening for genetic and chromosomal anomalies: Secondary | ICD-10-CM

## 2023-05-24 DIAGNOSIS — O09521 Supervision of elderly multigravida, first trimester: Secondary | ICD-10-CM | POA: Diagnosis not present

## 2023-05-24 DIAGNOSIS — O09511 Supervision of elderly primigravida, first trimester: Secondary | ICD-10-CM

## 2023-05-24 DIAGNOSIS — Z130 Encounter for screening for diseases of the blood and blood-forming organs and certain disorders involving the immune mechanism: Secondary | ICD-10-CM

## 2023-05-24 DIAGNOSIS — O26891 Other specified pregnancy related conditions, first trimester: Secondary | ICD-10-CM

## 2023-05-24 DIAGNOSIS — Z131 Encounter for screening for diabetes mellitus: Secondary | ICD-10-CM

## 2023-05-24 DIAGNOSIS — Z0283 Encounter for blood-alcohol and blood-drug test: Secondary | ICD-10-CM

## 2023-05-24 DIAGNOSIS — E669 Obesity, unspecified: Secondary | ICD-10-CM

## 2023-05-24 DIAGNOSIS — R110 Nausea: Secondary | ICD-10-CM

## 2023-05-24 DIAGNOSIS — Z98891 History of uterine scar from previous surgery: Secondary | ICD-10-CM

## 2023-05-24 DIAGNOSIS — Z3A12 12 weeks gestation of pregnancy: Secondary | ICD-10-CM

## 2023-05-24 DIAGNOSIS — Z0184 Encounter for antibody response examination: Secondary | ICD-10-CM

## 2023-05-24 DIAGNOSIS — Z113 Encounter for screening for infections with a predominantly sexual mode of transmission: Secondary | ICD-10-CM

## 2023-05-24 MED ORDER — BONJESTA 20-20 MG PO TBCR: 1.0000 | EXTENDED_RELEASE_TABLET | Freq: Two times a day (BID) | ORAL | 1 refills | Status: DC

## 2023-05-24 NOTE — Patient Instructions (Signed)
 First Trimester of Pregnancy  The first trimester of pregnancy starts on the first day of your last menstrual period until the end of week 12. This is months 1 through 3 of pregnancy. A week after a sperm fertilizes an egg, the egg will implant into the wall of the uterus and begin to develop into a baby. By the end of 12 weeks, all the baby's organs will be formed and the baby will be 2-3 inches in size. Body changes during your first trimester Your body goes through many changes during pregnancy. The changes vary and generally return to normal after your baby is born. Physical changes You may gain or lose weight. Your breasts may begin to grow larger and become tender. The tissue that surrounds your nipples (areola) may become darker. Dark spots or blotches (chloasma or mask of pregnancy) may develop on your face. You may have changes in your hair. These can include thickening or thinning of your hair or changes in texture. Health changes You may feel nauseous, and you may vomit. You may have heartburn. You may develop headaches. You may develop constipation. Your gums may bleed and may be sensitive to brushing and flossing. Other changes You may tire easily. You may urinate more often. Your menstrual periods will stop. You may have a loss of appetite. You may develop cravings for certain kinds of food. You may have changes in your emotions from day to day. You may have more vivid and strange dreams. Follow these instructions at home: Medicines Follow your health care provider's instructions regarding medicine use. Specific medicines may be either safe or unsafe to take during pregnancy. Do not take any medicines unless told to by your health care provider. Take a prenatal vitamin that contains at least 600 micrograms (mcg) of folic acid. Eating and drinking Eat a healthy diet that includes fresh fruits and vegetables, whole grains, good sources of protein such as meat, eggs, or tofu,  and low-fat dairy products. Avoid raw meat and unpasteurized juice, milk, and cheese. These carry germs that can harm you and your baby. If you feel nauseous or you vomit: Eat 4 or 5 small meals a day instead of 3 large meals. Try eating a few soda crackers. Drink liquids between meals instead of during meals. You may need to take these actions to prevent or treat constipation: Drink enough fluid to keep your urine pale yellow. Eat foods that are high in fiber, such as beans, whole grains, and fresh fruits and vegetables. Limit foods that are high in fat and processed sugars, such as fried or sweet foods. Activity Exercise only as directed by your health care provider. Most people can continue their usual exercise routine during pregnancy. Try to exercise for 30 minutes at least 5 days a week. Stop exercising if you develop pain or cramping in the lower abdomen or lower back. Avoid exercising if it is very hot or humid or if you are at high altitude. Avoid heavy lifting. If you choose to, you may have sex unless your health care provider tells you not to. Relieving pain and discomfort Wear a good support bra to relieve breast tenderness. Rest with your legs elevated if you have leg cramps or low back pain. If you develop bulging veins (varicose veins) in your legs: Wear support hose as told by your health care provider. Elevate your feet for 15 minutes, 3-4 times a day. Limit salt in your diet. Safety Wear your seat belt at all times when  driving or riding in a car. Talk with your health care provider if someone is verbally or physically abusive to you. Talk with your health care provider if you are feeling sad or have thoughts of hurting yourself. Lifestyle Do not use hot tubs, steam rooms, or saunas. Do not douche. Do not use tampons or scented sanitary pads. Do not use herbal remedies, alcohol, illegal drugs, or medicines that are not approved by your health care provider. Chemicals  in these products can harm your baby. Do not use any products that contain nicotine or tobacco, such as cigarettes, e-cigarettes, and chewing tobacco. If you need help quitting, ask your health care provider. Avoid cat litter boxes and soil used by cats. These carry germs that can cause birth defects in the baby and possibly loss of the unborn baby (fetus) by miscarriage or stillbirth. General instructions During routine prenatal visits in the first trimester, your health care provider will do a physical exam, perform necessary tests, and ask you how things are going. Keep all follow-up visits. This is important. Ask for help if you have counseling or nutritional needs during pregnancy. Your health care provider can offer advice or refer you to specialists for help with various needs. Schedule a dentist appointment. At home, brush your teeth with a soft toothbrush. Floss gently. Write down your questions. Take them to your prenatal visits. Where to find more information American Pregnancy Association: americanpregnancy.org [employer] of Obstetricians and Gynecologists: https://www.todd-brady.net/ Office on [employer] Health: MightyReward.co.nz Contact a health care provider if you have: Dizziness. A fever. Mild pelvic cramps, pelvic pressure, or nagging pain in the abdominal area. Nausea, vomiting, or diarrhea that lasts for 24 hours or longer. A bad-smelling vaginal discharge. Pain when you urinate. Known exposure to a contagious illness, such as chickenpox, measles, Zika virus, HIV, or hepatitis. Get help right away if you have: Spotting or bleeding from your vagina. Severe abdominal cramping or pain. Shortness of breath or chest pain. Any kind of trauma, such as from a fall or a car crash. New or increased pain, swelling, or redness in an arm or leg. Summary The first trimester of pregnancy starts on the first day of your last menstrual period until the end of week  12 (months 1 through 3). Eating 4 or 5 small meals a day rather than 3 large meals may help to relieve nausea and vomiting. Do not use any products that contain nicotine or tobacco, such as cigarettes, e-cigarettes, and chewing tobacco. If you need help quitting, ask your health care provider. Keep all follow-up visits. This is important. This information is not intended to replace advice given to you by your health care provider. Make sure you discuss any questions you have with your health care provider. Document Revised: 12/12/2019 Document Reviewed: 10/18/2019 Elsevier Patient Education  2024 [employer].

## 2023-05-25 LAB — URINALYSIS, ROUTINE W REFLEX MICROSCOPIC
Bilirubin, UA: NEGATIVE
Glucose, UA: NEGATIVE
Leukocytes,UA: NEGATIVE
Nitrite, UA: NEGATIVE
Protein,UA: NEGATIVE
RBC, UA: NEGATIVE
Specific Gravity, UA: 1.019 (ref 1.005–1.030)
Urobilinogen, Ur: 0.2 mg/dL (ref 0.2–1.0)
pH, UA: 7 (ref 5.0–7.5)

## 2023-05-26 ENCOUNTER — Other Ambulatory Visit: Payer: Self-pay | Admitting: Obstetrics and Gynecology

## 2023-05-26 DIAGNOSIS — O99810 Abnormal glucose complicating pregnancy: Secondary | ICD-10-CM

## 2023-05-26 LAB — CBC/D/PLT+RPR+RH+ABO+RUBIGG...
Antibody Screen: NEGATIVE
Basophils Absolute: 0 10*3/uL (ref 0.0–0.2)
Basos: 0 %
EOS (ABSOLUTE): 0.3 10*3/uL (ref 0.0–0.4)
Eos: 2 %
HCV Ab: NONREACTIVE
HIV Screen 4th Generation wRfx: NONREACTIVE
Hematocrit: 38 % (ref 34.0–46.6)
Hemoglobin: 12.4 g/dL (ref 11.1–15.9)
Hepatitis B Surface Ag: NEGATIVE
Immature Grans (Abs): 0 10*3/uL (ref 0.0–0.1)
Immature Granulocytes: 0 %
Lymphocytes Absolute: 4.3 10*3/uL — ABNORMAL HIGH (ref 0.7–3.1)
Lymphs: 33 %
MCH: 31.4 pg (ref 26.6–33.0)
MCHC: 32.6 g/dL (ref 31.5–35.7)
MCV: 96 fL (ref 79–97)
Monocytes Absolute: 0.8 10*3/uL (ref 0.1–0.9)
Monocytes: 6 %
Neutrophils Absolute: 7.4 10*3/uL — ABNORMAL HIGH (ref 1.4–7.0)
Neutrophils: 59 %
Platelets: 315 10*3/uL (ref 150–450)
RBC: 3.95 x10E6/uL (ref 3.77–5.28)
RDW: 14.6 % (ref 11.7–15.4)
RPR Ser Ql: NONREACTIVE
Rh Factor: POSITIVE
Rubella Antibodies, IGG: 27.4 {index} (ref >0.99)
Varicella zoster IgG: REACTIVE
WBC: 12.8 10*3/uL — ABNORMAL HIGH (ref 3.4–10.8)

## 2023-05-26 LAB — HEMOGLOBIN A1C
Est. average glucose Bld gHb Est-mCnc: 117 mg/dL
Hgb A1c MFr Bld: 5.7 % — ABNORMAL HIGH (ref 4.8–5.6)

## 2023-05-26 LAB — HGB FRACTIONATION CASCADE
Hgb A2: 2.3 % (ref 1.8–3.2)
Hgb A: 97.7 % (ref 96.4–98.8)
Hgb F: 0 % (ref 0.0–2.0)
Hgb S: 0 %

## 2023-05-26 LAB — HCV INTERPRETATION

## 2023-05-26 LAB — TSH+FREE T4
Free T4: 0.88 ng/dL (ref 0.82–1.77)
TSH: 0.719 u[IU]/mL (ref 0.450–4.500)

## 2023-05-27 LAB — CULTURE, OB URINE

## 2023-05-27 LAB — URINE CULTURE, OB REFLEX

## 2023-05-31 ENCOUNTER — Telehealth: Payer: Self-pay

## 2023-05-31 LAB — PANORAMA PRENATAL TEST FULL PANEL:PANORAMA TEST PLUS 5 ADDITIONAL MICRODELETIONS: FETAL FRACTION: 9.2

## 2023-05-31 NOTE — Telephone Encounter (Signed)
Pt calling; is 13wks; having rectal bleeding; is sure it is from the rectum and not the vagina; states she does have hemorrhoids; is worried about the baby.  Adv this doesn't affect the baby.  Pt reassured.

## 2023-06-03 ENCOUNTER — Telehealth: Payer: Self-pay

## 2023-06-03 NOTE — Telephone Encounter (Signed)
Pt called triage needing a note to return to work, she was sick with the flu last week. Letter sent through [facility]

## 2023-06-05 LAB — MONITOR DRUG PROFILE 14(MW)
Amphetamine Scrn, Ur: NEGATIVE ng/mL
BARBITURATE SCREEN URINE: NEGATIVE ng/mL
BENZODIAZEPINE SCREEN, URINE: NEGATIVE ng/mL
Buprenorphine, Urine: NEGATIVE ng/mL
Cocaine (Metab) Scrn, Ur: NEGATIVE ng/mL
Creatinine(Crt), U: 144.5 mg/dL (ref 20.0–300.0)
Fentanyl, Urine: NEGATIVE pg/mL
Meperidine Screen, Urine: NEGATIVE ng/mL
Methadone Screen, Urine: NEGATIVE ng/mL
OXYCODONE+OXYMORPHONE UR QL SCN: NEGATIVE ng/mL
Opiate Scrn, Ur: NEGATIVE ng/mL
Ph of Urine: 6.8 (ref 4.5–8.9)
Phencyclidine Qn, Ur: NEGATIVE ng/mL
Propoxyphene Scrn, Ur: NEGATIVE ng/mL
SPECIFIC GRAVITY: 1.021
Tramadol Screen, Urine: NEGATIVE ng/mL

## 2023-06-05 LAB — NICOTINE SCREEN, URINE: Cotinine Ql Scrn, Ur: POSITIVE ng/mL — AB

## 2023-06-05 LAB — CANNABINOID (GC/MS), URINE
Cannabinoid: POSITIVE — AB
Carboxy THC (GC/MS): 424 ng/mL

## 2023-06-13 ENCOUNTER — Telehealth: Payer: Self-pay | Admitting: Obstetrics and Gynecology

## 2023-06-13 NOTE — Telephone Encounter (Addendum)
Contacted the patient via phone, no answer, voicemail is full unable to leave message.Patient needs a early one hour the week of 12/2 please

## 2023-06-21 ENCOUNTER — Encounter: Payer: Medicaid Other | Admitting: Obstetrics and Gynecology

## 2023-06-21 DIAGNOSIS — Z3A16 16 weeks gestation of pregnancy: Secondary | ICD-10-CM

## 2023-06-21 DIAGNOSIS — O09512 Supervision of elderly primigravida, second trimester: Secondary | ICD-10-CM

## 2023-06-23 ENCOUNTER — Encounter: Payer: Self-pay | Admitting: Licensed Practical Nurse

## 2023-06-23 ENCOUNTER — Ambulatory Visit: Payer: Medicaid Other | Admitting: Licensed Practical Nurse

## 2023-06-23 VITALS — BP 127/84 | HR 104 | Wt 219.3 lb

## 2023-06-23 DIAGNOSIS — Z23 Encounter for immunization: Secondary | ICD-10-CM

## 2023-06-23 DIAGNOSIS — Z3A16 16 weeks gestation of pregnancy: Secondary | ICD-10-CM

## 2023-06-23 DIAGNOSIS — Z3482 Encounter for supervision of other normal pregnancy, second trimester: Secondary | ICD-10-CM

## 2023-06-23 DIAGNOSIS — Z3480 Encounter for supervision of other normal pregnancy, unspecified trimester: Secondary | ICD-10-CM

## 2023-06-23 NOTE — Progress Notes (Signed)
Routine Prenatal Care Visit  Subjective  Judy Conner is a 37 y.o. [id number] at [redacted]w[redacted]d being seen today for ongoing prenatal care.  She is currently monitored for the following issues for this high-risk pregnancy and has Gastroesophageal reflux disease; History of umbilical hernia; Vitamin D deficiency; Antepartum multigravida of advanced maternal age; Obesity in pregnancy, antepartum; History of cesarean delivery; and Supervision of other normal pregnancy, antepartum on their problem list.  ----------------------------------------------------------------------------------- Patient reports heartburn.   -Doing ok, admits to having a lot stress d/t her personal life, but managing. Has some fears related to being older and needing a 4th c-section-aware of the risks, recommend  Mindful birthing -HA1C elevated, will return for 1 hour glucose test   Contractions: Not present. Vag. Bleeding: None.  Movement: Present. Leaking Fluid denies.  ----------------------------------------------------------------------------------- The following portions of the patient's history were reviewed and updated as appropriate: allergies, current medications, past family history, past medical history, past social history, past surgical history and problem list. Problem list updated.  Objective  Blood pressure 127/84, pulse (!) 104, weight 219 lb 4.8 oz (99.5 kg), last menstrual period 02/25/2023. Pregravid weight 221 lb (100.2 kg) Total Weight Gain -1 lb 11.2 oz (-0.771 kg) Urinalysis: Urine Protein    Urine Glucose    Fetal Status: Fetal Heart Rate (bpm): 155   Movement: Present     General:  Alert, oriented and cooperative. Patient is in no acute distress.  Skin: Skin is warm and dry. No rash noted.   Cardiovascular: Normal heart rate noted  Respiratory: Normal respiratory effort, no problems with respiration noted  Abdomen: Soft, gravid, appropriate for gestational age. Pain/Pressure: Absent     Pelvic:   Cervical exam deferred        Extremities: Normal range of motion.  Edema: None  Mental Status: Normal mood and affect. Normal behavior. Normal judgment and thought content.   Assessment   37 y.o. [id number] at [redacted]w[redacted]d by  12/02/2023, by Last Menstrual Period presenting for routine prenatal visit  Plan   sixth Problems (from 04/27/23 to present)     Problem Noted Resolved   Supervision of other normal pregnancy, antepartum 04/27/2023 by Loran Senters, CMA No   Overview Signed 04/27/2023  4:07 PM by Loran Senters, CMA     Clinical Staff Provider  Office Location  [facility] Dating  12/02/2023, by Last Menstrual Period  Language  English Anatomy US    Flu Vaccine  offer Genetic Screen  NIPS:   TDaP vaccine   offer Hgb A1C or  GTT Early : Third trimester :   Covid One booster   LAB RESULTS   Rhogam  --/--/O POS Performed at [facility], [address] [place], [place] [address]  [phone]/24 1533)  Blood Type --/--/O POS Performed at [facility], [address] [place], [place] [address]  [id number]/24 [id number])   RSV offer Antibody  Negative  10/24/2016  Feeding Plan maybe Rubella    Contraception undecided RPR     Circumcision yes HBsAg   Non reactive  08/15/2020  Pediatrician  [facility] HIV  I/II Non reactive  01/30/2021  Support Person Homero Fellers Varicella    Prenatal Classes no GBS  (For PCN allergy, check sensitivities)     Hep C     BTL Consent  Pap No results found for: "DIAGPAP"  VBAC Consent  Hgb Electro      CF      SMA   TSH  1.01  12/11/2019 A1c 5.7  04/26/2023              Preterm labor symptoms and general obstetric precautions including but not limited to vaginal bleeding, contractions, leaking of fluid and fetal movement were reviewed in detail with the patient. Please refer to After Visit Summary for other counseling recommendations.   Return in about 4 weeks (around 07/21/2023) for 1 hour glucose in 1 wk, ROB in 4wks.  Anatomy US  ordered  Flu vaccine given   [name]   [facility]  06/23/23  4:38 PM

## 2023-07-18 ENCOUNTER — Emergency Department
Admission: EM | Admit: 2023-07-18 | Discharge: 2023-07-18 | Discharge status: Against Medical Advice | Payer: Medicaid Other

## 2023-07-21 ENCOUNTER — Other Ambulatory Visit: Payer: Self-pay | Admitting: Licensed Practical Nurse

## 2023-07-21 ENCOUNTER — Encounter: Payer: Medicaid Other | Admitting: Certified Nurse Midwife

## 2023-07-21 DIAGNOSIS — Z3480 Encounter for supervision of other normal pregnancy, unspecified trimester: Secondary | ICD-10-CM

## 2023-07-21 DIAGNOSIS — Z23 Encounter for immunization: Secondary | ICD-10-CM

## 2023-07-21 DIAGNOSIS — Z363 Encounter for antenatal screening for malformations: Secondary | ICD-10-CM

## 2023-07-22 ENCOUNTER — Other Ambulatory Visit: Payer: Medicaid Other

## 2023-07-22 ENCOUNTER — Encounter: Payer: Self-pay | Admitting: Obstetrics

## 2023-07-22 ENCOUNTER — Ambulatory Visit (INDEPENDENT_AMBULATORY_CARE_PROVIDER_SITE_OTHER): Payer: Medicaid Other | Admitting: Obstetrics

## 2023-07-22 VITALS — BP 126/78 | HR 102

## 2023-07-22 DIAGNOSIS — Z3A21 21 weeks gestation of pregnancy: Secondary | ICD-10-CM

## 2023-07-22 DIAGNOSIS — O99810 Abnormal glucose complicating pregnancy: Secondary | ICD-10-CM

## 2023-07-22 DIAGNOSIS — O2242 Hemorrhoids in pregnancy, second trimester: Secondary | ICD-10-CM

## 2023-07-22 DIAGNOSIS — Z3689 Encounter for other specified antenatal screening: Secondary | ICD-10-CM

## 2023-07-22 DIAGNOSIS — Z3480 Encounter for supervision of other normal pregnancy, unspecified trimester: Secondary | ICD-10-CM

## 2023-07-22 MED ORDER — PROCTOFOAM HC 1-1 % EX FOAM: 1.0000 | Freq: Two times a day (BID) | CUTANEOUS | 1 refills | Status: DC

## 2023-07-22 MED ORDER — OXYCODONE HCL 5 MG PO TABS
5.0000 mg | ORAL_TABLET | ORAL | 0 refills | Status: DC | PRN
Start: 2023-07-22 — End: 2023-09-19

## 2023-07-22 NOTE — Progress Notes (Signed)
 Routine Prenatal Care Visit  Subjective  Judy Conner is a 38 y.o. [id number] at [redacted]w[redacted]d being seen today for ongoing prenatal care.  She is currently monitored for the following issues for this high-risk pregnancy and has Gastroesophageal reflux disease; History of umbilical hernia; Vitamin D  deficiency; Antepartum multigravida of advanced maternal age; Obesity in pregnancy, antepartum; History of cesarean delivery; and Supervision of other normal pregnancy, antepartum on their problem list.  ----------------------------------------------------------------------------------- Patient reports  terrible pain from large hemorrhoids and constipation .  She cannot sit down due to her painful hemorrhoids. Very tearful. Baby is moving well, no problems obstrically. She wa late for her antomy scan so missed today.  .  .   Judy Conner Fluid denies.  ----------------------------------------------------------------------------------- The following portions of the patient's history were reviewed and updated as appropriate: allergies, current medications, past family history, past medical history, past social history, past surgical history and problem list. Problem list updated.  Objective  Blood pressure 126/78, pulse (!) 102, last menstrual period 02/25/2023. Pregravid weight 221 lb (100.2 kg) Total Weight Gain -1 lb 11.2 oz (-0.771 kg) Urinalysis: Urine Protein    Urine Glucose    Fetal Status: Fetal Heart Rate (bpm): 158         General:  Alert, oriented and cooperative. Patient is in no acute distress.  Skin: Skin is warm and dry. No rash noted.   Cardiovascular: Normal heart rate noted  Respiratory: Normal respiratory effort, no problems with respiration noted  Abdomen: Soft, gravid, appropriate for gestational age.       Pelvic:  Cervical exam deferred        Extremities: Normal range of motion.     Mental Status: Normal mood and affect. Normal behavior. Normal judgment and thought content.    Assessment   38 y.o. [id number] at [redacted]w[redacted]d by  12/02/2023, by Last Menstrual Period presenting for routine prenatal visit  Plan   sixth Problems (from 04/27/23 to present)     Problem Noted Diagnosed Resolved   Supervision of other normal pregnancy, antepartum 04/27/2023 by Judy Conner, CMA  No   Overview Addendum 06/23/2023  4:40 PM by Judy Conner, CNM   Clinical Staff Provider  Office Location  [facility] Dating  12/02/2023, by Last Menstrual Period  Language  English Anatomy US     Flu Vaccine  06/23/2023 Genetic Screen  NIPS: low risk   TDaP vaccine   offer Hgb A1C or  GTT Early :5.7 Third trimester :   Covid One booster   LAB RESULTS   Rhogam  O/Positive/-- (11/05 1528)  Blood Type O/Positive/-- (11/05 1528)   RSV offer Antibody Negative (11/05 1528)Negative  10/24/2016  Feeding Plan maybe Rubella 27.40 (11/05 1528)  Contraception undecided RPR Non Reactive (11/05 1528)   Circumcision yes HBsAg Negative (11/05 1528) Non reactive  08/15/2020  Pediatrician  [facility] HIV Non Reactive (11/05 1528)I/II Non reactive  01/30/2021  Support Person Dempsey Varicella Reactive (11/05 1528)  Prenatal Classes no GBS  (For PCN allergy, check sensitivities)     Hep C Non Reactive (11/05 1528)   BTL Consent  Pap Diagnosis  Date Value Ref Range Status  04/26/2023   Final   - Negative for intraepithelial lesion or malignancy (NILM)    VBAC Consent  Hgb Electro      CF      SMA   TSH  1.01  12/11/2019 A1c 5.7  04/26/2023  Preterm labor symptoms and general obstetric precautions including but not limited to vaginal bleeding, contractions, leaking of fluid and fetal movement were reviewed in detail with the patient. Please refer to After Visit Summary for other counseling recommendations.  Stat referral to surgical consult for lancing of hemorrhoids. Will try Glycerin  supps for constipation. Limited Roxi given for hemorrhoid pain. Also needs early  1hr GTT- set up  Return for 1-2 weeks for anatomy scan and 1 hour GTT, then ROB in 4 weeks.  Rollene CHRISTELLA Ro, CNM  07/22/2023 4:30 PM

## 2023-07-22 NOTE — Progress Notes (Signed)
 Patient refused to communicate with me.

## 2023-07-23 ENCOUNTER — Other Ambulatory Visit: Payer: Self-pay

## 2023-07-23 ENCOUNTER — Emergency Department
Admission: EM | Admit: 2023-07-23 | Discharge: 2023-07-23 | Disposition: A | Discharge status: Home / Self Care | Payer: Medicaid Other | Attending: Emergency Medicine | Admitting: Emergency Medicine

## 2023-07-23 DIAGNOSIS — O2242 Hemorrhoids in pregnancy, second trimester: Secondary | ICD-10-CM | POA: Insufficient documentation

## 2023-07-23 DIAGNOSIS — Z3A20 20 weeks gestation of pregnancy: Secondary | ICD-10-CM | POA: Diagnosis not present

## 2023-07-23 DIAGNOSIS — K644 Residual hemorrhoidal skin tags: Secondary | ICD-10-CM

## 2023-07-23 MED ORDER — LIDOCAINE-EPINEPHRINE-TETRACAINE (LET) TOPICAL GEL
3.0000 mL | Freq: Once | TOPICAL | Status: AC
  Administered 2023-07-23: 3 mL via TOPICAL
  Filled 2023-07-23: qty 3

## 2023-07-23 MED ORDER — LIDO-EPINEPHRINE-TETRACAINE 4-0.18-0.5 % EX GEL: 3.0000 mL | CUTANEOUS | 2 refills | Status: DC | PRN

## 2023-07-23 NOTE — ED Provider Notes (Signed)
 [facility] Provider Note   Event Date/Time   First MD Initiated Contact with Patient 07/23/23 [phone]     (approximate) History  Hemorrhoids  HPI Stehanie Conner is a 38 y.o. female with a stated past medical history of 20 weeks pregnancy who presents complaining of external hemorrhoids that are 10/10 in severity and present over the last 4 days.  Patient states she was seen by her primary care physician yesterday and prescribed topical hydrocortisone and oral oxycodone  every 4 hours as needed.  Patient states that her pain proved by these medications and is still a 10/10 in severity patient states that she was told to come to the emergency department incision of this hemorrhoid pain persists. ROS: Patient currently denies any vision changes, tinnitus, difficulty speaking, facial droop, sore throat, chest pain, shortness of breath, abdominal pain, nausea/vomiting/diarrhea, dysuria, or weakness/numbness/paresthesias in any extremity   Physical Exam  Triage Vital Signs: ED Triage Vitals  Encounter Vitals Group     BP 07/23/23 0723 127/77     Systolic BP Percentile --      Diastolic BP Percentile --      Pulse Rate 07/23/23 0723 98     Resp 07/23/23 0723 18     Temp 07/23/23 0723 98.4 F (36.9 C)     Temp Source 07/23/23 0723 Oral     SpO2 07/23/23 0723 94 %     Weight 07/23/23 0722 220 lb (99.8 kg)     Height 07/23/23 0722 5' 5 (1.651 m)     Head Circumference --      Peak Flow --      Pain Score 07/23/23 0721 10     Pain Loc --      Pain Education --      Exclude from Growth Chart --    Most recent vital signs: Vitals:   07/23/23 0723  BP: 127/77  Pulse: 98  Resp: 18  Temp: 98.4 F (36.9 C)  SpO2: 94%   General: Awake, oriented x4. CV:  Good peripheral perfusion.  Resp:  Normal effort.  Abd:  No distention.  Gravid Rectal:  External hemorrhoids with swollen, ecchymosis, and tender to palpation at the 11 o'clock position to the  rectum Other:  Middle-aged gravid African-American female resting comfortably in no acute distress ED Results / Procedures / Treatments  Labs (all labs ordered are listed, but only abnormal results are displayed) Labs Reviewed - No data to display PROCEDURES: Critical Care performed: No Procedures MEDICATIONS ORDERED IN ED: Medications  lidocaine -EPINEPHrine -tetracaine  (LET) topical gel (3 mLs Topical Given 07/23/23 0805)   IMPRESSION / MDM / ASSESSMENT AND PLAN / ED COURSE  I reviewed the triage vital signs and the nursing notes.                             The patient is on the cardiac monitor to evaluate for evidence of arrhythmia and/or significant heart rate changes. Patient's presentation is most consistent with acute presentation with potential threat to life or bodily function. Patient's history and exam most consistent with hemorrhoid as an etiology for their pain.  Exam not consistent with thrombosed hemorrhoid. Patient's symptoms and exam not typical for other emergent causes of rectal pain such as, but not limited to, anorectal abscess, rectal foreign body, anal fissure, anal fistula, proctitis, rectal prolapse.  Rx: Bowel regimen, topical lidocaine , sitz bath TID and after each bowel movement  Disposition:  Patient will be discharged with strict return precautions and follow up with primary MD within 24-48 hours for further evaluation.   FINAL CLINICAL IMPRESSION(S) / ED DIAGNOSES   Final diagnoses:  Inflamed external hemorrhoid   Rx / DC Orders   ED Discharge Orders          Ordered    lido-epinephrine -tetracaine  (LET) 4-0.18-0.5 % GEL  Every 4 hours PRN        07/23/23 0855           Note:  This document was prepared using Dragon voice recognition software and may include unintentional dictation errors.   [name], MD 07/23/23 [phone]

## 2023-07-23 NOTE — ED Triage Notes (Addendum)
 Patient states she was seen at [facility] yesterday and told she has a cluster of hemorrhoids and to come to ED to have them lanced. Reports she is [redacted] weeks pregnant.

## 2023-07-26 ENCOUNTER — Other Ambulatory Visit: Payer: Medicaid Other

## 2023-07-26 DIAGNOSIS — O99810 Abnormal glucose complicating pregnancy: Secondary | ICD-10-CM

## 2023-07-27 LAB — GLUCOSE, 1 HOUR GESTATIONAL: Gestational Diabetes Screen: 112 mg/dL (ref 70–139)

## 2023-07-28 ENCOUNTER — Encounter: Payer: Self-pay | Admitting: General Surgery

## 2023-07-28 ENCOUNTER — Ambulatory Visit (INDEPENDENT_AMBULATORY_CARE_PROVIDER_SITE_OTHER): Payer: Medicaid Other | Admitting: General Surgery

## 2023-07-28 VITALS — BP 100/63 | HR 97 | Temp 98.2°F | Ht 65.0 in | Wt 227.0 lb

## 2023-07-28 DIAGNOSIS — K649 Unspecified hemorrhoids: Secondary | ICD-10-CM | POA: Diagnosis not present

## 2023-07-28 DIAGNOSIS — K641 Second degree hemorrhoids: Secondary | ICD-10-CM

## 2023-07-28 NOTE — Patient Instructions (Signed)
 We recommend you taking a fiber supplement like Metamucil or Fibercon tabs. Be sure to drink plenty of fluids as well.  You may use the Recticare that we provided. You may also get this over the counter.  Do sitz baths/warm tub soaks 1-2 times a day for comfort.   Follow-up with our office as needed.  Please call and ask to speak with a nurse if you develop questions or concerns.    High-Fiber Eating Plan Fiber, also called dietary fiber, is found in foods such as fruits, vegetables, whole grains, and beans. A high-fiber diet can be good for your health. Your health care provider may recommend a high-fiber diet to help: Prevent trouble pooping (constipation). Lower your cholesterol. Treat the following conditions: Hemorrhoids. This is inflammation of veins in the anus. Inflammation of specific areas of the digestive tract. Irritable bowel syndrome (IBS). This is a problem of the large intestine, also called the colon, that sometimes causes belly pain and bloating. Prevent overeating as part of a weight-loss plan. Lower the risk of heart disease, type 2 diabetes, and certain cancers. What are tips for following this plan? Reading food labels  Check the nutrition facts label on foods for the amount of dietary fiber. Choose foods that have 4 grams of fiber or more per serving. The recommended goals for how much fiber you should eat each day include: Males 61 years old or younger: 30-34 g. Males over 38 years old: 28-34 g. Females 66 years old or younger: 25-28 g. Females over 70 years old: 22-25 g. Your daily fiber goal is _____________ g. Shopping Choose whole fruits and vegetables instead of processed. For example, choose apples instead of apple juice or applesauce. Choose a variety of high-fiber foods such as avocados, lentils, oats, and pinto beans. Read the nutrition facts label on foods. Check for foods with added fiber. These foods often have high sugar and salt (sodium)  amounts per serving. Cooking Use whole-grain flour for baking and cooking. Cook with brown rice instead of white rice. Make meals that have a lot of beans and vegetables in them, such as chili or vegetable-based soups. Meal planning Start the day with a breakfast that is high in fiber, such as a cereal that has 5 g of fiber or more per serving. Eat breads and cereals that are made with whole-grain flour instead of refined flour or white flour. Eat brown rice, bulgur wheat, or millet instead of white rice. Use beans in place of meat in soups, salads, and pasta dishes. Be sure that half of the grains you eat each day are whole grains. General information You can get the recommended amount of dietary fiber by: Eating a variety of fruits, vegetables, grains, nuts, and beans. Taking a fiber supplement if you aren't able to eat enough fiber. It's better to get fiber through food than from a supplement. Slowly increase how much fiber you eat. If you increase the amount of fiber you eat too quickly, you may have bloating, cramping, or gas. Drink plenty of water to help you digest fiber. Choose high-fiber snacks, such as berries, raw vegetables, nuts, and popcorn. What foods should I eat? Fruits Berries. Pears. Apples. Oranges. Avocado. Prunes and raisins. Dried figs. Vegetables Sweet potatoes. Spinach. Kale. Artichokes. Cabbage. Broccoli. Cauliflower. Green peas. Carrots. Squash. Grains Whole-grain breads. Multigrain cereal. Oats and oatmeal. Brown rice. Barley. Bulgur wheat. Millet. Quinoa. Bran muffins. Popcorn. Rye wafer crackers. Meats and other proteins Navy beans, kidney beans, and pinto beans. Soybeans.  Split peas. Lentils. Nuts and seeds. Dairy Fiber-fortified yogurt. Fortified means that fiber has been added to the product. Beverages Fiber-fortified soy milk. Fiber-fortified orange juice. Other foods Fiber bars. The items listed above may not be all the foods and drinks you can  have. Talk to a dietitian to learn more. What foods should I avoid? Fruits Fruit juice. Cooked, strained fruit. Vegetables Fried potatoes. Canned vegetables. Well-cooked vegetables. Grains White bread. Pasta made with refined flour. White rice. Meats and other proteins Fatty meat. Fried chicken or fried fish. Dairy Milk. Cream cheese. Sour cream. Fats and oils Butters. Beverages Soft drinks. Other foods Cakes and pastries. The items listed above may not be all the foods and drinks you should avoid. Talk to a dietitian to learn more. This information is not intended to replace advice given to you by your health care provider. Make sure you discuss any questions you have with your health care provider. Document Revised: 09/27/2022 Document Reviewed: 09/27/2022 Elsevier Patient Education  2024 [employer].

## 2023-08-01 ENCOUNTER — Ambulatory Visit
Admission: RE | Admit: 2023-08-01 | Discharge: 2023-08-01 | Disposition: A | Discharge status: Home / Self Care | Payer: Medicaid Other | Source: Ambulatory Visit | Attending: Obstetrics | Admitting: Obstetrics

## 2023-08-01 DIAGNOSIS — Z3480 Encounter for supervision of other normal pregnancy, unspecified trimester: Secondary | ICD-10-CM | POA: Diagnosis present

## 2023-08-01 DIAGNOSIS — Z3A22 22 weeks gestation of pregnancy: Secondary | ICD-10-CM | POA: Diagnosis not present

## 2023-08-01 DIAGNOSIS — Z3689 Encounter for other specified antenatal screening: Secondary | ICD-10-CM | POA: Diagnosis present

## 2023-08-04 ENCOUNTER — Other Ambulatory Visit: Payer: Medicaid Other

## 2023-08-08 NOTE — Progress Notes (Signed)
Patient ID: Judy Conner, female   DOB: Aug 27, 1985, 38 y.o.   MRN: [id number] CC: Hemorrhoids History of Present Illness Judy Conner is a 38 y.o. female with past medical history as below who who is currently [redacted] weeks pregnant who presents in consultation for hemorrhoids.  Patient reports that since her pregnancy she has had worsening hemorrhoids.  She went to the ED several days ago for 10 out of 10 pain.  She has been using hydrocortisone and oral narcotics for the pain.  She denies any bleeding from her hemorrhoids.  She says that she tries not to strain when she uses the bathroom and does not sit on the toilet for very long.  She does not take a fiber supplementation.  Past Medical History Past Medical History:  Diagnosis Date   Anemia    hx low iron    Chronic kidney disease    hx uti none currently   Dysmenorrhea, unspecified 12/12/2019   GERD (gastroesophageal reflux disease)    Headache(784.0)    History of umbilical hernia 12/12/2019   Low serum potassium    Sciatica    Urticaria        Past Surgical History:  Procedure Laterality Date   CESAREAN SECTION  [phone]   x 3   FINGER FRACTURE SURGERY Left    pinky   WISDOM TOOTH EXTRACTION     four    Allergies  Allergen Reactions   Fish Oil Anaphylaxis    Fruits   Fruit & Vegetable Daily [Nutritional Supplements] Anaphylaxis    Fruits   Fentanyl     Pt states that she has never had Fentanyl but her sister and her father have both had a reaction to Fentanyl     Current Outpatient Medications  Medication Sig Dispense Refill   Doxylamine-Pyridoxine ER (BONJESTA) 20-20 MG TBCR Take 1 tablet by mouth 2 (two) times daily. 60 tablet 1   famotidine (PEPCID) 20 MG tablet Take 1 tablet (20 mg total) by mouth 2 (two) times daily. 60 tablet 0   hydrocortisone-pramoxine (PROCTOFOAM HC) rectal foam Place 1 applicator rectally 2 (two) times daily. 16.3 g 1   oxyCODONE (ROXICODONE) 5 MG immediate release tablet Take  1 tablet (5 mg total) by mouth every 4 (four) hours as needed for severe pain (pain score 7-10). 10 tablet 0   Prenatal Vit-Fe Fumarate-FA (PRENATAL VITAMINS PO) Take by mouth.     lido-epinephrine-tetracaine (LET) 4-0.18-0.5 % GEL Apply 3 mLs topically every 4 (four) hours as needed (rectal pain). 3 mL 2   No current facility-administered medications for this visit.    Family History Family History  Problem Relation Age of Onset   Healthy Mother    Heart disease Father    Hypertension Father    Crohn's disease Father    Hypertension Sister    Healthy Sister    Healthy Brother    Breast cancer Maternal Grandmother    Diabetes Maternal Grandmother    Clotting disorder Paternal Uncle        Social History Social History   Tobacco Use   Smoking status: Some Days    Current packs/day: 0.25    Average packs/day: 0.3 packs/day for 4.0 years (1.0 ttl pk-yrs)    Types: Cigarettes    Passive exposure: Past   Smokeless tobacco: Never   Tobacco comments:    Occ   Vaping Use   Vaping status: Never Used  Substance Use Topics   Alcohol use: Not Currently  Comment: occasional   Drug use: No        ROS Full ROS of systems performed and is otherwise negative there than what is stated in the HPI  Physical Exam Blood pressure 100/63, pulse 97, temperature 98.2 F (36.8 C), height 5\' 5"  (1.651 m), weight 227 lb (103 kg), last menstrual period 02/25/2023, SpO2 98%.  No acute distress, alert and oriented x 3, PERRLA, moving all extremity spontaneously, normal work of breathing on room air Digital rectal exam performed in the presence of a chaperone.  On inspection there was no evidence of thrombosed hemorrhoid.  There was no prolapse of any internal hemorrhoids.  Digital rectal exam performed and no gross masses or lesions identified.  No gross blood.  Unfortunately she was unable to tolerate an anoscopy exam. Data Reviewed Reviewed past notes and patient was in the ED for pain  from her hemorrhoids  I have personally reviewed the patient's imaging and medical records.    Assessment/Plan    Patient is a 38 year old female who is [redacted] weeks pregnant who is here in consultation for hemorrhoids.  Her pain is improved after likely having a thrombosed hemorrhoid.  She has not taking any fiber supplementation.  I discussed with her that during pregnancy many women have exacerbation or onset of pain and bleeding from the hemorrhoids.  We at first try lifestyle modification before we would entertain any surgical intervention.  I recommended taking a fiber supplementation and using Calmoseptine cream as needed for pain.  If after her delivery she has any bleeding or continues to have the pain we can see her back in the office to discuss surgical options   Kandis Cocking

## 2023-08-10 ENCOUNTER — Other Ambulatory Visit: Payer: Self-pay | Admitting: Obstetrics

## 2023-08-10 ENCOUNTER — Encounter: Payer: Self-pay | Admitting: Obstetrics

## 2023-08-10 DIAGNOSIS — Z362 Encounter for other antenatal screening follow-up: Secondary | ICD-10-CM

## 2023-08-10 NOTE — Telephone Encounter (Signed)
I contacted the patient via phone, I offered 2/13 at 11:15  am and 2/14 at 11:15 and 3 PM. The patient refused scheduling. Preferred next 8 am due to work. The patient is scheduled for 2/19 at 8:15 for ultrasound scheduling.

## 2023-08-10 NOTE — Telephone Encounter (Signed)
Ok that's fine, thanks for scheduling her.

## 2023-08-19 ENCOUNTER — Telehealth: Payer: Self-pay

## 2023-08-19 DIAGNOSIS — K219 Gastro-esophageal reflux disease without esophagitis: Secondary | ICD-10-CM

## 2023-08-19 DIAGNOSIS — E559 Vitamin D deficiency, unspecified: Secondary | ICD-10-CM

## 2023-08-19 DIAGNOSIS — Z3480 Encounter for supervision of other normal pregnancy, unspecified trimester: Secondary | ICD-10-CM

## 2023-08-19 DIAGNOSIS — O99210 Obesity complicating pregnancy, unspecified trimester: Secondary | ICD-10-CM

## 2023-08-19 DIAGNOSIS — Z98891 History of uterine scar from previous surgery: Secondary | ICD-10-CM

## 2023-08-19 DIAGNOSIS — Z8719 Personal history of other diseases of the digestive system: Secondary | ICD-10-CM

## 2023-08-19 DIAGNOSIS — Z3A25 25 weeks gestation of pregnancy: Secondary | ICD-10-CM

## 2023-08-19 DIAGNOSIS — O09529 Supervision of elderly multigravida, unspecified trimester: Secondary | ICD-10-CM

## 2023-08-19 NOTE — Telephone Encounter (Signed)
Reached out to pt to reschedule ROB appt that was scheduled on 08/19/2023 at 8:35 with S. Free. Left message for pt to call back.

## 2023-08-22 NOTE — Telephone Encounter (Signed)
Reached out to pt (2x)  to reschedule ROB appt that was scheduled on 08/19/23 at 8:35 with S. Free.  Left message for pt to call back.  Will send a MyChart letter.

## 2023-08-30 ENCOUNTER — Ambulatory Visit (INDEPENDENT_AMBULATORY_CARE_PROVIDER_SITE_OTHER): Payer: Medicaid Other | Admitting: Obstetrics

## 2023-08-30 ENCOUNTER — Encounter: Payer: Self-pay | Admitting: Obstetrics

## 2023-08-30 VITALS — BP 132/74 | HR 96 | Wt 236.0 lb

## 2023-08-30 DIAGNOSIS — O99210 Obesity complicating pregnancy, unspecified trimester: Secondary | ICD-10-CM

## 2023-08-30 DIAGNOSIS — Z3A26 26 weeks gestation of pregnancy: Secondary | ICD-10-CM | POA: Diagnosis not present

## 2023-08-30 DIAGNOSIS — O34219 Maternal care for unspecified type scar from previous cesarean delivery: Secondary | ICD-10-CM | POA: Diagnosis not present

## 2023-08-30 DIAGNOSIS — O99212 Obesity complicating pregnancy, second trimester: Secondary | ICD-10-CM | POA: Diagnosis not present

## 2023-08-30 DIAGNOSIS — Z131 Encounter for screening for diabetes mellitus: Secondary | ICD-10-CM

## 2023-08-30 DIAGNOSIS — Z98891 History of uterine scar from previous surgery: Secondary | ICD-10-CM

## 2023-08-30 DIAGNOSIS — E669 Obesity, unspecified: Secondary | ICD-10-CM

## 2023-08-30 DIAGNOSIS — Z3480 Encounter for supervision of other normal pregnancy, unspecified trimester: Secondary | ICD-10-CM

## 2023-08-30 MED ORDER — BLOOD GLUCOSE TEST VI STRP
1.0000 | ORAL_STRIP | Freq: Four times a day (QID) | 0 refills | Status: AC
Start: 2023-08-30 — End: 2023-09-29

## 2023-08-30 MED ORDER — LANCETS MISC. MISC
1.0000 | Freq: Four times a day (QID) | 0 refills | Status: AC
Start: 2023-08-30 — End: 2023-09-29

## 2023-08-30 MED ORDER — BLOOD GLUCOSE MONITORING SUPPL DEVI
1.0000 | Freq: Four times a day (QID) | 0 refills | Status: DC
Start: 2023-08-30 — End: 2023-11-27

## 2023-08-30 MED ORDER — LANCET DEVICE MISC
1.0000 | Freq: Four times a day (QID) | 0 refills | Status: AC
Start: 2023-08-30 — End: 2023-09-29

## 2023-08-30 NOTE — Assessment & Plan Note (Signed)
-  Next visit with MD to schedule CS

## 2023-08-30 NOTE — Assessment & Plan Note (Signed)
-  Growth scans in 3rd trimester if BMI>40

## 2023-08-30 NOTE — Assessment & Plan Note (Signed)
-  Declines 1-hour glucose. Discussed rationale of early 1-hour and regular 1-hour. Prefers to check sugars at home. Glucometer and test strips ordered, instructions to check fasting and 2-hour PP sugars and bring log to next visit. -Difficulty getting to appointments d/t work schedule: offered spaced visits if no complications or alternating video visits with in-person visits. -Plans to reschedule anatomy f/u d/t scheduling conflict -OK to take benadryl or unisom for sleep

## 2023-08-30 NOTE — Progress Notes (Signed)
    Return Prenatal Note   Assessment/Plan   Plan  38 y.o. [id number] at [redacted]w[redacted]d presents for follow-up OB visit. Reviewed prenatal record including previous visit note.  History of cesarean delivery -Next visit with MD to schedule CS  Supervision of other normal pregnancy, antepartum -Declines 1-hour glucose. Discussed rationale of early 1-hour and regular 1-hour. Prefers to check sugars at home. Glucometer and test strips ordered, instructions to check fasting and 2-hour PP sugars and bring log to next visit. -Difficulty getting to appointments d/t work schedule: offered spaced visits if no complications or alternating video visits with in-person visits. -Plans to reschedule anatomy f/u d/t scheduling conflict -OK to take benadryl or unisom for sleep  Obesity in pregnancy, antepartum -Growth scans in 3rd trimester if BMI>40   No orders of the defined types were placed in this encounter.  Return in about 2 weeks (around 09/13/2023).   Future Appointments  Date Time Provider Department Center  09/07/2023  8:15 AM [facility] [place] 1 [facility] None  09/13/2023  8:35 AM Judy Manson, MD [facility] None    For next visit:   ROB with MD, CS scheduling    Subjective   Judy Conner is feeling irritable all the time and is sleeping poorly. She is dissatisfied with her care and would prefer to have more [place]. Scheduling visits before work is difficult, and Judy Conner feels like they are not a good use of time. She would prefer to do video visits when possible. She is tired of being pregnant and "just wants this to be over."  Movement: Present Contractions: Not present  Objective   Flow sheet Vitals: Pulse Rate: 96 BP: 132/74 Fundal Height: 26 cm Fetal Heart Rate (bpm): 154 Total weight gain: 15 lb (6.804 kg)  General Appearance  No acute distress, well appearing, and well nourished Pulmonary   Normal work of breathing Neurologic   Alert and oriented to person, place, and time Psychiatric   Mood and  affect within normal limits  Judy Conner, CNM 08/30/23 9:34 AM

## 2023-09-05 ENCOUNTER — Other Ambulatory Visit: Payer: Self-pay

## 2023-09-05 ENCOUNTER — Encounter: Payer: Self-pay | Admitting: Obstetrics and Gynecology

## 2023-09-05 ENCOUNTER — Observation Stay
Admission: EM | Admit: 2023-09-05 | Discharge: 2023-09-05 | Disposition: A | Discharge status: Home / Self Care | Payer: Medicaid Other | Attending: Family | Admitting: Family

## 2023-09-05 ENCOUNTER — Telehealth: Payer: Self-pay

## 2023-09-05 DIAGNOSIS — Z3A27 27 weeks gestation of pregnancy: Principal | ICD-10-CM

## 2023-09-05 DIAGNOSIS — F17210 Nicotine dependence, cigarettes, uncomplicated: Secondary | ICD-10-CM | POA: Diagnosis not present

## 2023-09-05 DIAGNOSIS — O0992 Supervision of high risk pregnancy, unspecified, second trimester: Secondary | ICD-10-CM | POA: Diagnosis not present

## 2023-09-05 DIAGNOSIS — Z79899 Other long term (current) drug therapy: Secondary | ICD-10-CM | POA: Diagnosis not present

## 2023-09-05 DIAGNOSIS — N189 Chronic kidney disease, unspecified: Secondary | ICD-10-CM | POA: Insufficient documentation

## 2023-09-05 DIAGNOSIS — O10212 Pre-existing hypertensive chronic kidney disease complicating pregnancy, second trimester: Secondary | ICD-10-CM | POA: Diagnosis present

## 2023-09-05 DIAGNOSIS — O162 Unspecified maternal hypertension, second trimester: Secondary | ICD-10-CM | POA: Diagnosis not present

## 2023-09-05 DIAGNOSIS — O99332 Smoking (tobacco) complicating pregnancy, second trimester: Secondary | ICD-10-CM | POA: Diagnosis not present

## 2023-09-05 DIAGNOSIS — I129 Hypertensive chronic kidney disease with stage 1 through stage 4 chronic kidney disease, or unspecified chronic kidney disease: Secondary | ICD-10-CM | POA: Diagnosis not present

## 2023-09-05 NOTE — OB Triage Note (Signed)
Judy Conner 38 y.o. @G6P3  @27w3dGA   presents to Labor & Delivery triage via wheelchair steered by ED staff reporting concern for high blood pressures that were taken at home. The nurse line advised her to come in for a BP check. She denies signs and symptoms consistent with rupture of membranes or active vaginal bleeding. She denies contractions and states positive fetal movement. External FM and TOCO applied to non-tender abdomen. Initial FHR 150. Vital signs obtained and within normal limits. Patient oriented to care environment including call bell and bed control use. Raeford Razor, CNM notified of patient's arrival.

## 2023-09-05 NOTE — Telephone Encounter (Signed)
Patient calling in with concerns of elevated blood pressure readings at home, bilateral ankle edema, slight headaches and elevated glucose readings. She states home BP readings are ranging between 138-145/90's. Advised patient to go to L&D for further work up for Pre-E. She states she is heading that way.

## 2023-09-05 NOTE — Final Progress Note (Signed)
OB/Triage Note  Patient ID: Judy Conner MRN: [id number] DOB/AGE: 1986/04/15 37 y.o.  Subjective  History of Present Illness: The patient is a 38 y.o. female [phone] at [redacted]w[redacted]d who presents for concerns about her blood pressure. She has been taking her blood pressure regularly at home with a wrist BP cuff. Had noticed today it was typically in the 140s/80s, at one point reached 153/90. When using the cuff was not consistent in positioning, often would be lying on her bed flat with arm dangling off bed, also often talking, sometimes sitting upright.    Verity denies headache, visual changes, RUQ pain, painful contractions, VB or LOF. Endorses good fetal movement.   Past Medical History:  Diagnosis Date   Anemia    hx low iron    Chronic kidney disease    hx uti none currently   Dysmenorrhea, unspecified 12/12/2019   GERD (gastroesophageal reflux disease)    Headache(784.0)    History of umbilical hernia 12/12/2019   Low serum potassium    Sciatica    Urticaria     Past Surgical History:  Procedure Laterality Date   CESAREAN SECTION  [phone]   x 3   FINGER FRACTURE SURGERY Left    pinky   WISDOM TOOTH EXTRACTION     four    No current facility-administered medications on file prior to encounter.   Current Outpatient Medications on File Prior to Encounter  Medication Sig Dispense Refill   famotidine (PEPCID) 20 MG tablet Take 1 tablet (20 mg total) by mouth 2 (two) times daily. 60 tablet 0   Prenatal Vit-Fe Fumarate-FA (PRENATAL VITAMINS PO) Take by mouth.     Blood Glucose Monitoring Suppl DEVI 1 each by Does not apply route 4 (four) times daily. May substitute to any manufacturer covered by patient's insurance. 1 each 0   Doxylamine-Pyridoxine ER (BONJESTA) 20-20 MG TBCR Take 1 tablet by mouth 2 (two) times daily. (Patient not taking: Reported on 08/30/2023) 60 tablet 1   Glucose Blood (BLOOD GLUCOSE TEST STRIPS) STRP 1 each by In Vitro route 4 (four) times  daily. May substitute to any manufacturer covered by patient's insurance. 100 strip 0   hydrocortisone-pramoxine (PROCTOFOAM HC) rectal foam Place 1 applicator rectally 2 (two) times daily. (Patient not taking: Reported on 08/30/2023) 16.3 g 1   Lancet Device MISC 1 each by Does not apply route 4 (four) times daily. May substitute to any manufacturer covered by patient's insurance. 1 each 0   Lancets Misc. MISC 1 each by Does not apply route 4 (four) times daily. May substitute to any manufacturer covered by patient's insurance. 100 each 0   lido-epinephrine-tetracaine (LET) 4-0.18-0.5 % GEL Apply 3 mLs topically every 4 (four) hours as needed (rectal pain). (Patient not taking: Reported on 08/30/2023) 3 mL 2   oxyCODONE (ROXICODONE) 5 MG immediate release tablet Take 1 tablet (5 mg total) by mouth every 4 (four) hours as needed for severe pain (pain score 7-10). (Patient not taking: Reported on 08/30/2023) 10 tablet 0    Allergies  Allergen Reactions   Fish Oil Anaphylaxis    Fruits   Fruit & Vegetable Daily [Nutritional Supplements] Anaphylaxis    Fruits   Fentanyl     Pt states that she has never had Fentanyl but her sister and her father have both had a reaction to Fentanyl     Social History   Socioeconomic History   Marital status: Single    Spouse name: Not on file  Number of children: 3   Years of education: 14   Highest education level: Associate degree: occupational, Scientist, product/process development, or vocational program  Occupational History   Occupation: CNA  Tobacco Use   Smoking status: Some Days    Current packs/day: 0.25    Average packs/day: 0.3 packs/day for 4.0 years (1.0 ttl pk-yrs)    Types: Cigarettes    Passive exposure: Past   Smokeless tobacco: Never   Tobacco comments:    Occ   Vaping Use   Vaping status: Never Used  Substance and Sexual Activity   Alcohol use: Not Currently    Comment: occasional   Drug use: No   Sexual activity: Not Currently    Partners: Male     Birth control/protection: None  Other Topics Concern   Not on file  Social History Narrative   Divorced- lives with 3 kids and single parent since 2019   Social Drivers of Health   Financial Resource Strain: High Risk (04/27/2023)   Overall Financial Resource Strain (CARDIA)    Difficulty of Paying Living Expenses: Very hard  Food Insecurity: Food Insecurity Present (04/27/2023)   Hunger Vital Sign    Worried About Running Out of Food in the Last Year: Often true    Ran Out of Food in the Last Year: Sometimes true  Transportation Needs: No Transportation Needs (04/27/2023)   PRAPARE - Administrator, Civil Service (Medical): No    Lack of Transportation (Non-Medical): No  Physical Activity: Sufficiently Active (04/27/2023)   Exercise Vital Sign    Days of Exercise per Week: 3 days    Minutes of Exercise per Session: 60 min  Stress: No Stress Concern Present (04/27/2023)   [employer] of Occupational Health - Occupational Stress Questionnaire    Feeling of Stress : Not at all  Social Connections: Unknown (04/27/2023)   Social Connection and Isolation Panel [NHANES]    Frequency of Communication with Friends and Family: More than three times a week    Frequency of Social Gatherings with Friends and Family: Never    Attends Religious Services: Never    Database administrator or Organizations: No    Attends Banker Meetings: Never    Marital Status: Not on file  Intimate Partner Violence: Not At Risk (04/27/2023)   Humiliation, Afraid, Rape, and Kick questionnaire    Fear of Current or Ex-Partner: No    Emotionally Abused: No    Physically Abused: No    Sexually Abused: No    Family History  Problem Relation Age of Onset   Healthy Mother    Heart disease Father    Hypertension Father    Crohn's disease Father    Hypertension Sister    Healthy Sister    Healthy Brother    Breast cancer Maternal Grandmother    Diabetes Maternal Grandmother     Clotting disorder Paternal Uncle      ROS    Objective  Physical Exam: BP 107/67   Pulse 96   Temp 98 F (36.7 C) (Oral)   Resp 18   LMP 02/25/2023 (Exact Date)   Did serial Bps for one hour, all blood pressures were normotensive.   OBGyn Exam  FHT 155, mod variability, pos accels, no decels Toco no contractions    Hospital Course: The patient was admitted to [facility] for observation. Serial blood pressures all WNL. No s/sx of preeclampsia. Likely had high readings on cuff home monitor due to not using  it correcting, I.e. holding arms to chest while sitting/legs uncrossed. Reassurance provided.  Assessment: 38 y.o. female [phone] at [redacted]w[redacted]d  Normotensive RNST  Plan: Discharge home Follow up at next ROB Teaching: s/sx of preeclampsia  Discharge Instructions     Discharge activity:  No Restrictions   Complete by: As directed    Discharge diet:  No restrictions   Complete by: As directed    Discharge instructions   Complete by: As directed    Follow up with your OB provider at your next ROB   LABOR:  When conractions begin, you should start to time them from the beginning of one contraction to the beginning  of the next.  When contractions are 5 - 10 minutes apart or less and have been regular for at least an hour, you should call your health care provider.   Complete by: As directed    No sexual activity restrictions   Complete by: As directed    Notify physician for a general feeling that "something is not right"   Complete by: As directed    Notify physician for bleeding from the vagina   Complete by: As directed    Notify physician for blurring of vision or spots before the eyes   Complete by: As directed    Notify physician for chills or fever   Complete by: As directed    Notify physician for fainting spells, "black outs" or loss of consciousness   Complete by: As directed    Notify physician for increase in vaginal discharge   Complete by: As directed     Notify physician for increase or change in vaginal discharge   Complete by: As directed    Notify physician for intestinal cramps, with or without diarrhea, sometimes described as "gas pain"   Complete by: As directed    Notify physician for leaking of fluid   Complete by: As directed    Notify physician for leaking of fluid   Complete by: As directed    Notify physician for low, dull backache, unrelieved by heat or Tylenol   Complete by: As directed    Notify physician for menstrual like cramps   Complete by: As directed    Notify physician for pain or burning when urinating   Complete by: As directed    Notify physician for pelvic pressure   Complete by: As directed    Notify physician for pelvic pressure (sudden increase)   Complete by: As directed    Notify physician for severe or continued nausea or vomiting   Complete by: As directed    Notify physician for sudden gushing of fluid from the vagina (with or without continued leaking)   Complete by: As directed    Notify physician for sudden, constant, or occasional abdominal pain   Complete by: As directed    Notify physician for uterine contractions.  These may be painless and feel like the uterus is tightening or the baby is  "balling up"   Complete by: As directed    Notify physician for vaginal bleeding   Complete by: As directed    Notify physician if baby moving less than usual   Complete by: As directed    PRETERM LABOR:  Includes any of the follwing symptoms that occur between 20 - [redacted] weeks gestation.  If these symptoms are not stopped, preterm labor can result in preterm delivery, placing your baby at risk   Complete by: As directed  Allergies as of 09/05/2023       Reactions   Fish Oil Anaphylaxis   Fruits   Fruit & Vegetable Daily [nutritional Supplements] Anaphylaxis   Fruits   Fentanyl    Pt states that she has never had Fentanyl but her sister and her father have both had a reaction to Fentanyl         Medication List     TAKE these medications    Blood Glucose Monitoring Suppl Devi 1 each by Does not apply route 4 (four) times daily. May substitute to any manufacturer covered by patient's insurance.   BLOOD GLUCOSE TEST STRIPS Strp 1 each by In Vitro route 4 (four) times daily. May substitute to any manufacturer covered by patient's insurance.   Bonjesta 20-20 MG Tbcr Generic drug: Doxylamine-Pyridoxine ER Take 1 tablet by mouth 2 (two) times daily.   famotidine 20 MG tablet Commonly known as: PEPCID Take 1 tablet (20 mg total) by mouth 2 (two) times daily.   Lancet Device Misc 1 each by Does not apply route 4 (four) times daily. May substitute to any manufacturer covered by patient's insurance.   Lancets Misc. Misc 1 each by Does not apply route 4 (four) times daily. May substitute to any manufacturer covered by patient's insurance.   lido-epinephrine-tetracaine 4-0.18-0.5 % Gel Commonly known as: LET Apply 3 mLs topically every 4 (four) hours as needed (rectal pain).   oxyCODONE 5 MG immediate release tablet Commonly known as: Roxicodone Take 1 tablet (5 mg total) by mouth every 4 (four) hours as needed for severe pain (pain score 7-10).   PRENATAL VITAMINS PO Take by mouth.   Proctofoam HC rectal foam Generic drug: hydrocortisone-pramoxine Place 1 applicator rectally 2 (two) times daily.         Total time spent taking care of this patient: 60 minutes  Signed: Raeford Razor CNM, FNP 09/05/2023, 8:30 PM

## 2023-09-07 ENCOUNTER — Other Ambulatory Visit: Payer: Medicaid Other

## 2023-09-09 ENCOUNTER — Ambulatory Visit
Admission: RE | Admit: 2023-09-09 | Discharge: 2023-09-09 | Disposition: A | Discharge status: Home / Self Care | Payer: Medicaid Other | Source: Ambulatory Visit | Attending: Obstetrics | Admitting: Obstetrics

## 2023-09-09 DIAGNOSIS — Z362 Encounter for other antenatal screening follow-up: Secondary | ICD-10-CM | POA: Diagnosis present

## 2023-09-13 ENCOUNTER — Encounter: Payer: Medicaid Other | Admitting: Obstetrics

## 2023-09-16 DIAGNOSIS — O99210 Obesity complicating pregnancy, unspecified trimester: Secondary | ICD-10-CM

## 2023-09-16 DIAGNOSIS — K219 Gastro-esophageal reflux disease without esophagitis: Secondary | ICD-10-CM

## 2023-09-16 DIAGNOSIS — Z3480 Encounter for supervision of other normal pregnancy, unspecified trimester: Secondary | ICD-10-CM

## 2023-09-16 NOTE — Progress Notes (Signed)
    Return Prenatal Note   Subjective  38 y.o. [id number] at [redacted]w[redacted]d presents for this follow-up prenatal visit. Pregnancy notable for prior CD x 3, AMA, BMI, marijuana use, tobacco use in pregnancy.   Patient reports: daily fetal movement with occasional pressure. She reports not tracking her sugars, checking them only when she remembers and not timed to eating/drinking. Requesting 5/9 for rCD if available. Agrees to Tdap today and is open to discussing 1hGTT.    Movement: Present Contractions: Not present Denies vaginal bleeding or leaking fluid. Objective  Flow sheet Vitals: Pulse Rate: (!) 102 BP: 121/65 Fundal Height: 30 cm Fetal Heart Rate (bpm): 159 Total weight gain: 17 lb 11.2 oz (8.029 kg)  General Appearance  No acute distress, well appearing, and well nourished Pulmonary   Normal work of breathing Neurologic   Alert and oriented to person, place, and time Psychiatric   Mood and affect within normal limits  Assessment/Plan   Plan  38 y.o. [id number] at [redacted]w[redacted]d by LMP=6wk [place] presents for follow-up OB visit. Reviewed prenatal record including previous visit note.  1. Supervision of high risk pregnancy, antepartum (Primary) - Discussion re: 1hGTT and labs, pt previously declined 1hr in lieu of profiling but hasn't been checking sugars regularly. Rationale for 1hr reiterated and pt agrees to perform. Should RTC in 1-2 days for 1hGTT/28wk labs.  -Tdap done today.  2. History of cesarean delivery x 3 -Repeat scheduled for 11/25/23 ([redacted]w[redacted]d); pt considering post-placental ParaGard.   3. Obesity in pregnancy, antepartum 4. AMA (advanced maternal age) multigravida 35+, third trimester -32wk growth [place] ordered -Weekly NSTs at 34wks   Orders Placed This Encounter  Procedures   US OB Follow Up    Standing Status:   Future    Expected Date:   10/03/2023    Expiration Date:   09/18/2024    Reason for exam::   BMI, AMA    Preferred imaging location?:   Internal   Tdap vaccine greater  than or equal to 7yo IM   28 Week RH+Panel   Return in about 2 weeks (around 10/03/2023) for 2wks for ROB and growth [place]; 1-2 days for 1hGTT.    Julieanne Manson, DO [facility]

## 2023-09-19 ENCOUNTER — Encounter: Payer: Self-pay | Admitting: Obstetrics

## 2023-09-19 ENCOUNTER — Telehealth: Payer: Self-pay | Admitting: Obstetrics

## 2023-09-19 ENCOUNTER — Ambulatory Visit (INDEPENDENT_AMBULATORY_CARE_PROVIDER_SITE_OTHER): Payer: Medicaid Other | Admitting: Obstetrics

## 2023-09-19 VITALS — BP 121/65 | HR 102 | Wt 238.7 lb

## 2023-09-19 DIAGNOSIS — O34219 Maternal care for unspecified type scar from previous cesarean delivery: Secondary | ICD-10-CM

## 2023-09-19 DIAGNOSIS — O99213 Obesity complicating pregnancy, third trimester: Secondary | ICD-10-CM | POA: Diagnosis not present

## 2023-09-19 DIAGNOSIS — Z23 Encounter for immunization: Secondary | ICD-10-CM

## 2023-09-19 DIAGNOSIS — Z3A29 29 weeks gestation of pregnancy: Secondary | ICD-10-CM

## 2023-09-19 DIAGNOSIS — O09523 Supervision of elderly multigravida, third trimester: Secondary | ICD-10-CM

## 2023-09-19 DIAGNOSIS — Z98891 History of uterine scar from previous surgery: Secondary | ICD-10-CM

## 2023-09-19 DIAGNOSIS — E669 Obesity, unspecified: Secondary | ICD-10-CM | POA: Diagnosis not present

## 2023-09-19 DIAGNOSIS — O0990 Supervision of high risk pregnancy, unspecified, unspecified trimester: Secondary | ICD-10-CM

## 2023-09-19 DIAGNOSIS — O99210 Obesity complicating pregnancy, unspecified trimester: Secondary | ICD-10-CM

## 2023-09-19 DIAGNOSIS — Z3480 Encounter for supervision of other normal pregnancy, unspecified trimester: Secondary | ICD-10-CM

## 2023-09-19 NOTE — Telephone Encounter (Signed)
 Attempt to reach the patient, no answer. I left no message.

## 2023-09-19 NOTE — Telephone Encounter (Signed)
 Patient left today with out checking out. The recall from Dr Lonny Prude says Return in about 2 weeks (around 10/03/2023) for 2wks for ROB and growth [place]; 1-2 days for 1hGTT. I was able to get the patient scheduled for growth scan in two weeks. But attempts x2 to reach the patient via phone were unsuccessful. I left generic message for the patient to contact our office.

## 2023-09-19 NOTE — Telephone Encounter (Signed)
 The patient is aware and scheduled for 1 gtt / growth and ROB. The patient has confirmed all of these scheduled appointments

## 2023-09-19 NOTE — Patient Instructions (Signed)
 Third Trimester of Pregnancy  The third trimester of pregnancy is from week 28 through week 40. This is months 7 through 9. The third trimester is a time when your baby is growing fast. Body changes during your third trimester Your body continues to change during this time. The changes usually go away after your baby is born. Physical changes You will continue to gain weight. You may get stretch marks on your hips, belly, and breasts. Your breasts will keep growing and may hurt. A yellow fluid (colostrum) may leak from your breasts. This is the first milk you're making for your baby. Your hair may grow faster and get thicker. In some cases, you may get hair loss. Your belly button may stick out. You may have more swelling in your hands, face, or ankles. Health changes You may have heartburn. You may feel short of breath. This is caused by the uterus that is now bigger. You may have more aches in the pelvis, back, or thighs. You may have more tingling or numbness in your hands, arms, and legs. You may pee more often. You may have trouble pooping (constipation) or swollen veins in the butt that can itch or get painful (hemorrhoids). Other changes You may have more problems sleeping. You may notice the baby moving lower in your belly (dropping). You may have more fluid coming from your vagina. Your joints may feel loose, and you may have pain around your pelvic bone. Follow these instructions at home: Medicines Take medicines only as told by your health care provider. Some medicines are not safe during pregnancy. Your provider may change the medicines that you take. Do not take any medicines unless told to by your provider. Take a prenatal vitamin that has at least 600 micrograms (mcg) of folic acid. Do not use herbal medicines, illegal drugs, or medicines that are not approved by your provider. Eating and drinking While you're pregnant your body needs additional nutrition to help  support your growing baby. Talk with your provider about your nutritional needs. Activity Most women are able to exercise regularly during pregnancy. Exercise routines may need to change at the end of your pregnancy. Talk to your provider about your activities and exercise routine. Relieving pain and discomfort Rest often with your legs raised if you have leg cramps or low back pain. Take warm sitz baths to soothe pain from hemorrhoids. Use hemorrhoid cream if your provider says it's okay. Wear a good, supportive bra if your breasts hurt. Do not use hot tubs, steam rooms, or saunas. Do not douche. Do not use tampons or scented pads. Safety Talk to your provider before traveling far distances. Wear your seatbelt at all times when you're in a car. Talk to your provider if someone hits you, hurts you, or yells at you. Preparing for birth To prepare for your baby: Take childbirth and breastfeeding classes. Visit the hospital and tour the maternity area. Buy a rear-facing car seat. Learn how to install it in your car. General instructions Avoid cat litter boxes and soil used by cats. These things carry germs that can cause harm to your pregnancy and your baby. Do not drink alcohol, smoke, vape, or use products with nicotine or tobacco in them. If you need help quitting, talk with your provider. Keep all follow-up visits for your third trimester. Your provider will do more exams and tests during this trimester. Write down your questions. Take them to your prenatal visits. Your provider also will: Talk with you about  your overall health. Give you advice or refer you to specialists who can help with different needs, including: Mental health and counseling. Foods and healthy eating. Ask for help if you need help with food. Where to find more information American Pregnancy Association: americanpregnancy.org [employer] of Obstetricians and Gynecologists: acog.org Office on [employer] Health:  [employer] Contact a health care provider if: You have a headache that does not go away when you take medicine. You have any of these problems: You can't eat or drink. You have nausea and vomiting. You have watery poop (diarrhea) for 2 days or more. You have pain when you pee, or your pee smells bad. You have been sick for 2 days or more and aren't getting better. Contact your provider right away if: You have any of these coming from your vagina: Abnormal discharge. Bad-smelling fluid. Bleeding. Your baby is moving less than usual. You have signs of labor: You have any contractions, belly cramping, or have pain in your pelvis or lower back before 37 weeks of pregnancy (preterm labor). You have regular contractions that are less than 5 minutes apart. Your water breaks. You have symptoms of high blood pressure or preeclampsia. These include: A severe, throbbing headache that does not go away. Sudden or extreme swelling of your face, hands, legs, or feet. Vision problems: You see spots. You have blurry vision. Your eyes are sensitive to light. If you can't reach your provider, go to an urgent care or emergency room. Get help right away if: You faint, become confused, or can't think clearly. You have chest pain or trouble breathing. You have any kind of injury, such as from a fall or a car crash. These symptoms may be an emergency. Call 911 right away. Do not wait to see if the symptoms will go away. Do not drive yourself to the hospital. This information is not intended to replace advice given to you by your health care provider. Make sure you discuss any questions you have with your health care provider. Document Revised: 04/07/2023 Document Reviewed: 11/05/2022 Elsevier Patient Education  2024 [employer].

## 2023-09-23 ENCOUNTER — Other Ambulatory Visit

## 2023-09-23 ENCOUNTER — Telehealth: Payer: Self-pay | Admitting: Obstetrics

## 2023-09-23 NOTE — Telephone Encounter (Signed)
 Reached out to pt to reschedule 1 hr glucose test that was scheduled on 09/23/2023 at 8:20.  Was able to reschedule the appt to 09/27/2023 at 8:00.

## 2023-09-26 ENCOUNTER — Other Ambulatory Visit: Payer: Self-pay | Admitting: Obstetrics

## 2023-09-26 DIAGNOSIS — Z98891 History of uterine scar from previous surgery: Secondary | ICD-10-CM

## 2023-09-27 ENCOUNTER — Other Ambulatory Visit

## 2023-09-28 ENCOUNTER — Other Ambulatory Visit: Payer: Self-pay | Admitting: Obstetrics

## 2023-09-28 ENCOUNTER — Encounter: Payer: Self-pay | Admitting: Obstetrics

## 2023-09-28 DIAGNOSIS — O99013 Anemia complicating pregnancy, third trimester: Secondary | ICD-10-CM | POA: Insufficient documentation

## 2023-09-28 LAB — 28 WEEK RH+PANEL
Basophils Absolute: 0 10*3/uL (ref 0.0–0.2)
Basos: 0 %
EOS (ABSOLUTE): 0.2 10*3/uL (ref 0.0–0.4)
Eos: 2 %
Gestational Diabetes Screen: 132 mg/dL (ref 70–139)
HIV Screen 4th Generation wRfx: NONREACTIVE
Hematocrit: 32.7 % — ABNORMAL LOW (ref 34.0–46.6)
Hemoglobin: 10.6 g/dL — ABNORMAL LOW (ref 11.1–15.9)
Immature Grans (Abs): 0.1 10*3/uL (ref 0.0–0.1)
Immature Granulocytes: 1 %
Lymphocytes Absolute: 2.7 10*3/uL (ref 0.7–3.1)
Lymphs: 22 %
MCH: 30.5 pg (ref 26.6–33.0)
MCHC: 32.4 g/dL (ref 31.5–35.7)
MCV: 94 fL (ref 79–97)
Monocytes Absolute: 0.7 10*3/uL (ref 0.1–0.9)
Monocytes: 6 %
Neutrophils Absolute: 8.4 10*3/uL — ABNORMAL HIGH (ref 1.4–7.0)
Neutrophils: 69 %
Platelets: 288 10*3/uL (ref 150–450)
RBC: 3.48 x10E6/uL — ABNORMAL LOW (ref 3.77–5.28)
RDW: 13.9 % (ref 11.7–15.4)
RPR Ser Ql: NONREACTIVE
WBC: 12.1 10*3/uL — ABNORMAL HIGH (ref 3.4–10.8)

## 2023-09-28 MED ORDER — DOCUSATE SODIUM 100 MG PO CAPS: 100.0000 mg | ORAL_CAPSULE | Freq: Every day | ORAL | 0 refills | Status: AC

## 2023-09-28 MED ORDER — FERROUS SULFATE 325 (65 FE) MG PO TBEC: 325.0000 mg | DELAYED_RELEASE_TABLET | Freq: Every day | ORAL | 0 refills | Status: AC

## 2023-10-04 ENCOUNTER — Ambulatory Visit: Attending: Obstetrics

## 2023-10-05 ENCOUNTER — Telehealth: Payer: Self-pay

## 2023-10-05 ENCOUNTER — Ambulatory Visit

## 2023-10-05 VITALS — BP 122/81 | HR 93 | Wt 244.0 lb

## 2023-10-05 DIAGNOSIS — K219 Gastro-esophageal reflux disease without esophagitis: Secondary | ICD-10-CM

## 2023-10-05 DIAGNOSIS — Z8719 Personal history of other diseases of the digestive system: Secondary | ICD-10-CM

## 2023-10-05 DIAGNOSIS — O99210 Obesity complicating pregnancy, unspecified trimester: Secondary | ICD-10-CM

## 2023-10-05 DIAGNOSIS — O34219 Maternal care for unspecified type scar from previous cesarean delivery: Secondary | ICD-10-CM

## 2023-10-05 DIAGNOSIS — E669 Obesity, unspecified: Secondary | ICD-10-CM | POA: Diagnosis not present

## 2023-10-05 DIAGNOSIS — Z3A31 31 weeks gestation of pregnancy: Secondary | ICD-10-CM

## 2023-10-05 DIAGNOSIS — O99213 Obesity complicating pregnancy, third trimester: Secondary | ICD-10-CM | POA: Diagnosis not present

## 2023-10-05 DIAGNOSIS — E559 Vitamin D deficiency, unspecified: Secondary | ICD-10-CM

## 2023-10-05 DIAGNOSIS — O99013 Anemia complicating pregnancy, third trimester: Secondary | ICD-10-CM | POA: Diagnosis not present

## 2023-10-05 DIAGNOSIS — O09529 Supervision of elderly multigravida, unspecified trimester: Secondary | ICD-10-CM

## 2023-10-05 DIAGNOSIS — D649 Anemia, unspecified: Secondary | ICD-10-CM

## 2023-10-05 DIAGNOSIS — Z98891 History of uterine scar from previous surgery: Secondary | ICD-10-CM

## 2023-10-05 DIAGNOSIS — O0990 Supervision of high risk pregnancy, unspecified, unspecified trimester: Secondary | ICD-10-CM

## 2023-10-05 NOTE — Assessment & Plan Note (Signed)
-   Provided comfort regarding recent difficulty surrounding housing. Message sent to Jola Schmidt to request local housing resources.  - Missed appointment for growth [place] yesterday, plans to reschedule today. - Reviewed kick counts and preterm labor warning signs. Instructed to call office or come to hospital with persistent headache, vision changes, regular contractions, leaking of fluid, decreased fetal movement or vaginal bleeding.

## 2023-10-05 NOTE — Assessment & Plan Note (Signed)
-   Repeat CD scheduled for 5/9.

## 2023-10-05 NOTE — Progress Notes (Signed)
    Return Prenatal Note   Assessment/Plan   Plan  38 y.o. [id number] at [redacted]w[redacted]d presents for follow-up OB visit. Reviewed prenatal record including previous visit note.  Supervision of high risk pregnancy, antepartum - Provided comfort regarding recent difficulty surrounding housing. Message sent to Jola Schmidt to request local housing resources.  - Missed appointment for growth [place] yesterday, plans to reschedule today. - Reviewed kick counts and preterm labor warning signs. Instructed to call office or come to hospital with persistent headache, vision changes, regular contractions, leaking of fluid, decreased fetal movement or vaginal bleeding.  Obesity in pregnancy, antepartum - Reviewed recommendation to start weekly NSTs at 34 weeks. Will start with next visit.   History of cesarean delivery - Repeat CD scheduled for 5/9.  Anemia during pregnancy in third trimester - Hasn't been able to pick up iron supplement. Provided with information for OTC resources that are cheaper than pharmacy.    No orders of the defined types were placed in this encounter.  Return in about 2 weeks (around 10/19/2023) for ROB with NST.   Future Appointments  Date Time Provider Department Center  10/13/2023  8:00 AM [facility]  10/20/2023  8:15 AM [facility] ROOM [facility] None  10/20/2023  8:55 AM Linzie Collin, MD [facility] None  11/21/2023  8:00 AM [facility] None    For next visit:   ROB with NST     Subjective   37 y.o. [id number] at [redacted]w[redacted]d presents for this follow-up prenatal visit.  Patient tearful at today's visit due to housing concerns. She missed her ultrasound yesterday because she has been so overwhelmed. Her landlord recently sold her apartment and the new landlord is increasing the rent from $650 to $1500 which she cannot afford. She is trying to find a way to keep her teenage son in the same school he currently attends, so he may move in with her mother but her mother  doesn't have enough room for both of them. She has an option to move in with her younger sister's MIL, but they live in [place] which is a two hour drive. She is working on alternatives.  Patient reports: Movement: Present Contractions: Not present  Objective   Flow sheet Vitals: Pulse Rate: 93 BP: 122/81 Fundal Height: 31 cm Fetal Heart Rate (bpm): 150 Presentation: Vertex Total weight gain: 23 lb (10.4 kg)  General Appearance  No acute distress, well appearing, and well nourished Pulmonary   Normal work of breathing Neurologic   Alert and oriented to person, place, and time Psychiatric   Mood and affect within normal limits  Lindalou Hose Amaal Dimartino, CNM  03/19/259:26 AM

## 2023-10-05 NOTE — Assessment & Plan Note (Signed)
-   Hasn't been able to pick up iron supplement. Provided with information for OTC resources that are cheaper than pharmacy.

## 2023-10-05 NOTE — Assessment & Plan Note (Signed)
-   Reviewed recommendation to start weekly NSTs at 34 weeks. Will start with next visit.

## 2023-10-12 ENCOUNTER — Ambulatory Visit
Admission: RE | Admit: 2023-10-12 | Discharge: 2023-10-12 | Disposition: A | Discharge status: Home / Self Care | Source: Ambulatory Visit | Attending: Obstetrics | Admitting: Obstetrics

## 2023-10-12 DIAGNOSIS — O99210 Obesity complicating pregnancy, unspecified trimester: Secondary | ICD-10-CM | POA: Diagnosis present

## 2023-10-12 DIAGNOSIS — O09523 Supervision of elderly multigravida, third trimester: Secondary | ICD-10-CM | POA: Diagnosis present

## 2023-10-13 ENCOUNTER — Ambulatory Visit: Admission: RE | Admit: 2023-10-13 | Source: Ambulatory Visit

## 2023-10-13 ENCOUNTER — Other Ambulatory Visit

## 2023-10-20 ENCOUNTER — Encounter: Payer: Self-pay | Admitting: Obstetrics and Gynecology

## 2023-10-20 ENCOUNTER — Ambulatory Visit: Admitting: Obstetrics and Gynecology

## 2023-10-20 ENCOUNTER — Other Ambulatory Visit

## 2023-10-20 VITALS — BP 123/81 | HR 89 | Wt 248.9 lb

## 2023-10-20 DIAGNOSIS — O0990 Supervision of high risk pregnancy, unspecified, unspecified trimester: Secondary | ICD-10-CM

## 2023-10-20 DIAGNOSIS — Z3A33 33 weeks gestation of pregnancy: Secondary | ICD-10-CM

## 2023-10-20 DIAGNOSIS — O34219 Maternal care for unspecified type scar from previous cesarean delivery: Secondary | ICD-10-CM | POA: Diagnosis not present

## 2023-10-20 DIAGNOSIS — O09529 Supervision of elderly multigravida, unspecified trimester: Secondary | ICD-10-CM

## 2023-10-20 DIAGNOSIS — O99210 Obesity complicating pregnancy, unspecified trimester: Secondary | ICD-10-CM

## 2023-10-20 DIAGNOSIS — Z98891 History of uterine scar from previous surgery: Secondary | ICD-10-CM

## 2023-10-20 DIAGNOSIS — O09523 Supervision of elderly multigravida, third trimester: Secondary | ICD-10-CM | POA: Diagnosis not present

## 2023-10-20 NOTE — Progress Notes (Addendum)
 ROB:  EGA = 33.6.  Doing well.  Reports daily fetal movement.  Occasional Deberah Pelton reported.  Patient scheduled for repeat cesarean delivery on 5/9.  NST today reactive.  Not taking ASA as directed.  In a hurry to leave because she is hungry.  Expressed her desire to deliver before 5/9.  We talked through the rationale for this dating and I have discussed weekly visits with NSTs.

## 2023-10-20 NOTE — Progress Notes (Signed)
 ROB. Patient states daily fetal movement along with pelvic pressure. Reports ankle edema. NST preformed today. Patient states no questions or concerns at this time.

## 2023-10-27 ENCOUNTER — Encounter: Payer: Self-pay | Admitting: Obstetrics

## 2023-10-27 ENCOUNTER — Other Ambulatory Visit

## 2023-10-27 ENCOUNTER — Ambulatory Visit (INDEPENDENT_AMBULATORY_CARE_PROVIDER_SITE_OTHER): Admitting: Obstetrics

## 2023-10-27 VITALS — BP 134/77 | HR 91 | Wt 248.0 lb

## 2023-10-27 DIAGNOSIS — E669 Obesity, unspecified: Secondary | ICD-10-CM | POA: Diagnosis not present

## 2023-10-27 DIAGNOSIS — O09523 Supervision of elderly multigravida, third trimester: Secondary | ICD-10-CM

## 2023-10-27 DIAGNOSIS — O99213 Obesity complicating pregnancy, third trimester: Secondary | ICD-10-CM

## 2023-10-27 DIAGNOSIS — O34219 Maternal care for unspecified type scar from previous cesarean delivery: Secondary | ICD-10-CM | POA: Diagnosis not present

## 2023-10-27 DIAGNOSIS — O0990 Supervision of high risk pregnancy, unspecified, unspecified trimester: Secondary | ICD-10-CM

## 2023-10-27 DIAGNOSIS — Z98891 History of uterine scar from previous surgery: Secondary | ICD-10-CM

## 2023-10-27 DIAGNOSIS — O99013 Anemia complicating pregnancy, third trimester: Secondary | ICD-10-CM

## 2023-10-27 DIAGNOSIS — Z3A34 34 weeks gestation of pregnancy: Secondary | ICD-10-CM | POA: Insufficient documentation

## 2023-10-27 NOTE — Patient Instructions (Signed)
 Fetal Movement Counts Pregnant people often feel their baby's first movements about halfway through the pregnancy. These movements may feel like flutters, rolls, or swishes at first. As the baby grows, pregnant people start noticing more kicks and jabs. Around week 28 of your pregnancy, most health care providers recommend counting how often the baby moves (fetal movement counts). Counting fetal movements is vital for high-risk pregnancies, aiming to prevent stillbirths, but counting benefits all pregnancies. What is a fetal movement count?  A fetal movement count is the number of times that you feel your baby move during a certain amount of time. This may also be called a fetal kick count. Pay attention to when your baby is most active. You may notice your baby's sleep and wake cycles. You may also notice things that make your baby move more. You should do a fetal movement count: When your baby is normally most active. At the same time each day. A good time to count movements is while resting after eating and drinking. How do I count fetal movements? Find a quiet, comfortable area. Sit or lie down on your left side. Write down the date, the start and stop times, and the number of movements that you felt between those two times. Take this information with you to your health care visits. Write down your start time when you feel the first movement. Count kicks, flutters, swishes, rolls, and jabs. You should feel at least 10 movements within a 2 hour period. You may stop counting after you have felt 10 movements, or if you have been counting for 2 hours. Write down the stop time. If you do not feel 10 movements in 2 hours, you should repeat the count after waiting a few hours. After two attempts without 10 movements, contact your provider for further instructions. Your provider may want to do additional tests to assess your baby's well-being. Contact a health care provider if: You feel fewer than 10  movements in 2 hours after two count attempts a few hours apart. Your baby is not moving as usual, or not at all. This information is not intended to replace advice given to you by your health care provider. Make sure you discuss any questions you have with your health care provider. Document Revised: 07/21/2022 Document Reviewed: 07/21/2022 Elsevier Patient Education  2024 Elsevier Inc.Nonstress Test A nonstress test is a procedure that is done during pregnancy in order to check the baby's heartbeat. The procedure can help to show if the baby (fetus) is healthy. It is commonly done when: The baby is past his or her due date. The pregnancy is high risk. The baby is moving less than normal. The mother has lost a pregnancy in the past. The health care provider suspects a problem with the baby's growth. There is too much or too little amniotic fluid. The procedure is often done in the third trimester of pregnancy to find out if an early delivery is needed and whether such a delivery is safe. During a nonstress test, the baby's heartbeat is monitored when the baby is resting and when the baby is moving. If the baby is healthy, the heart rate will increase when he or she moves or kicks and will return to normal when he or she rests. Tell a health care provider about: Any allergies you have. Any medical conditions you have. All medicines you are taking, including vitamins, herbs, eye drops, creams, and over-the-counter medicines. Any surgeries you have had. Any past pregnancies you have  had. What are the risks? There are no risks to you or your baby from a nonstress test. This procedure should not be painful or uncomfortable. What happens before the procedure? Eat a meal right before the test or as directed by your health care provider. Food may help encourage the baby to move. Use the restroom right before the test. What happens during the procedure?  Two monitors will be placed on your  abdomen. One will record the baby's heart rate and the other will record the contractions of your uterus. You may be asked to lie down on your side or to sit upright. You may be given a button to press when you feel your baby move. Your health care provider will listen to your baby's heartbeat and record it. He or she may also watch your baby's heartbeat on a screen. If the baby seems to be sleeping, you may be asked to drink some juice or soda, eat a snack, or change positions. The procedure may vary among health care providers and hospitals. What can I expect after procedure? Your health care provider will discuss the test results with you and make recommendations for the future. Depending on the results, your health care provider may order additional tests or another course of action. If your health care provider gave you any diet or activity instructions, make sure to follow them. Keep all follow-up visits. This is important. Summary A nonstress test is a procedure that is done during pregnancy in order to check the baby's heartbeat. The procedure can help show if the baby is healthy. The procedure is often done in the third trimester of pregnancy to find out if an early delivery is needed and whether such a delivery is safe. During a nonstress test, the baby's heartbeat is monitored when the baby is resting and when the baby is moving. If the baby is healthy, the heart rate will increase when he or she moves or kicks and will return to normal when he or she rests. Your health care provider will discuss the test results with you and make recommendations for the future. This information is not intended to replace advice given to you by your health care provider. Make sure you discuss any questions you have with your health care provider. Document Revised: 03/18/2021 Document Reviewed: 04/14/2020 Elsevier Patient Education  2024 [employer].

## 2023-10-27 NOTE — Progress Notes (Signed)
    Return Prenatal Note   Subjective  38 y.o. [id number] at [redacted]w[redacted]d presents for this follow-up prenatal visit. Pregnancy notable for prior CD x 3, AMA, BMI, marijuana use, tobacco use in pregnancy.   Patient here with FOB today. Is feeling uncomfortable due to fetal size. Does want the ParaGard placed during her c-section. Also would like to see delivery of infant during surgery if possible and FOB wants to "cut the cord." Has been taking iron after Hgb 10.6 with 28wk labs.  Patient reports: Movement: Present Contractions: Not present Denies vaginal bleeding or leaking fluid. Objective  Flow sheet Vitals: Pulse Rate: 91 BP: 134/77 Fundal Height: 35 cm Fetal Heart Rate (bpm): 148 Total weight gain: 27 lb (12.2 kg)  General Appearance  No acute distress, well appearing, and well nourished Pulmonary   Normal work of breathing Neurologic   Alert and oriented to person, place, and time Psychiatric   Mood and affect within normal limits  Fetus A Non-Stress Test Interpretation for 10/27/23  Indication:  Obesity  Fetal Heart Rate A Mode: External Baseline Rate (A): 148 bpm Variability: Moderate Accelerations: 15 x 15 Decelerations: None Scalp Stimulation: Negative Multiple birth?: No  Uterine Activity Mode: Toco Contraction Frequency (min): None  Interpretation (Fetal Testing) Nonstress Test Interpretation: Reactive Overall Impression: Reassuring for gestational age  Assessment/Plan   Plan  38 y.o. [id number] at [redacted]w[redacted]d by LMP=6wk [place] presents for follow-up OB visit. Reviewed prenatal record including previous visit note.  1. Supervision of high risk pregnancy, antepartum (Primary) 2. Obesity affecting pregnancy in third trimester, unspecified obesity type -NST today reactive, continue weekly until delivery -Last growth 3/26: [redacted]w[redacted]d, EFW 26%ile; repeat Q4wks, ordered.   3. History of cesarean delivery - Will request clear drape for rCD and discussed leaving long cord segment  for dad to cut. Pt also desires skin to skin once able.   4. Anemia during pregnancy in third trimester -Repeat CBC today, continue iron   Orders Placed This Encounter  Procedures   US OB Follow Up    Standing Status:   Future    Expected Date:   11/10/2023    Expiration Date:   10/26/2024    Reason for exam::   growth for maternal obesity    Preferred imaging location?:   [facility]   CBC   Return in about 1 week (around 11/03/2023) for ROB w/NST.   Future Appointments  Date Time Provider Department Center  11/03/2023  9:15 AM [facility] ROOM [facility] None  11/03/2023  9:55 AM Slaughterbeck, Irving Burton, CNM [facility] None  11/10/2023  9:15 AM [facility]-NST ROOM [facility] None  11/10/2023  9:55 AM Linzie Collin, MD [facility] None  11/21/2023  8:00 AM [facility] None   For next visit:  continue with routine prenatal care    Julieanne Manson, DO [facility] [place]

## 2023-10-28 LAB — CBC
Hematocrit: 33.3 % — ABNORMAL LOW (ref 34.0–46.6)
Hemoglobin: 11 g/dL — ABNORMAL LOW (ref 11.1–15.9)
MCH: 29.8 pg (ref 26.6–33.0)
MCHC: 33 g/dL (ref 31.5–35.7)
MCV: 90 fL (ref 79–97)
Platelets: 279 10*3/uL (ref 150–450)
RBC: 3.69 x10E6/uL — ABNORMAL LOW (ref 3.77–5.28)
RDW: 15.2 % (ref 11.7–15.4)
WBC: 11.8 10*3/uL — ABNORMAL HIGH (ref 3.4–10.8)

## 2023-10-31 ENCOUNTER — Encounter: Payer: Self-pay | Admitting: Obstetrics

## 2023-11-03 ENCOUNTER — Ambulatory Visit (INDEPENDENT_AMBULATORY_CARE_PROVIDER_SITE_OTHER): Admitting: Certified Nurse Midwife

## 2023-11-03 ENCOUNTER — Encounter: Payer: Self-pay | Admitting: Certified Nurse Midwife

## 2023-11-03 ENCOUNTER — Other Ambulatory Visit

## 2023-11-03 ENCOUNTER — Other Ambulatory Visit (HOSPITAL_COMMUNITY)
Admission: RE | Admit: 2023-11-03 | Discharge: 2023-11-03 | Disposition: A | Discharge status: Home / Self Care | Source: Ambulatory Visit | Attending: Certified Nurse Midwife | Admitting: Certified Nurse Midwife

## 2023-11-03 VITALS — BP 134/88 | HR 88 | Wt 253.7 lb

## 2023-11-03 DIAGNOSIS — O0990 Supervision of high risk pregnancy, unspecified, unspecified trimester: Secondary | ICD-10-CM

## 2023-11-03 DIAGNOSIS — O34219 Maternal care for unspecified type scar from previous cesarean delivery: Secondary | ICD-10-CM

## 2023-11-03 DIAGNOSIS — Z3A36 36 weeks gestation of pregnancy: Secondary | ICD-10-CM | POA: Diagnosis not present

## 2023-11-03 DIAGNOSIS — Z113 Encounter for screening for infections with a predominantly sexual mode of transmission: Secondary | ICD-10-CM | POA: Insufficient documentation

## 2023-11-03 DIAGNOSIS — E669 Obesity, unspecified: Secondary | ICD-10-CM | POA: Diagnosis not present

## 2023-11-03 DIAGNOSIS — O99213 Obesity complicating pregnancy, third trimester: Secondary | ICD-10-CM | POA: Diagnosis not present

## 2023-11-03 DIAGNOSIS — Z3685 Encounter for antenatal screening for Streptococcus B: Secondary | ICD-10-CM

## 2023-11-03 DIAGNOSIS — Z98891 History of uterine scar from previous surgery: Secondary | ICD-10-CM

## 2023-11-03 NOTE — Assessment & Plan Note (Signed)
-   Repeat CD scheduled for 5/9.

## 2023-11-03 NOTE — Assessment & Plan Note (Signed)
Reactive NST today 

## 2023-11-03 NOTE — Patient Instructions (Signed)

## 2023-11-03 NOTE — Progress Notes (Signed)
    Return Prenatal Note   Assessment/Plan   Plan  38 y.o. [id number] at [redacted]w[redacted]d presents for follow-up OB visit. Reviewed prenatal record including previous visit note.  Obesity affecting pregnancy in third trimester Reactive NST today  Supervision of high risk pregnancy, antepartum Patient here today with some distress. She was upset that a her weight was said out loud. She read about a 36 week IUFD and feels very anxious. Reviewed reactive NST and the reasons why it was reassuring. Offered her extended monitoring in triage so that she could feel reassurance that her baby was doing well. Patient declines.  She was also concerned about her blood pressure. Reviewed all her blood pressures this pregnancy and all have been normal. Discussed normal pathology of blood pressure in pregnancy. Offered her extended blood pressure monitoring in triage for reassurance. Patient declines.   History of cesarean delivery Repeat CD scheduled for 5/9    Orders Placed This Encounter  Procedures   Strep Gp B NAA   No follow-ups on file.   Future Appointments  Date Time Provider Department Center  11/09/2023  2:30 PM [facility]  3 [facility]  11/10/2023  9:15 AM [facility]-NST ROOM [facility] None  11/10/2023  9:55 AM Zenobia Hila, MD [facility] None  11/21/2023  8:00 AM [facility] PAT2 [facility] None    For next visit:  continue with routine prenatal care     Subjective   37 y.o. [id number] at [redacted]w[redacted]d presents for this follow-up prenatal visit.  Patient expresses concerns about fetal heart rate being too high and her blood pressure being too high. Feels very stressed that her weight was said in front of her and her partner. Patient is very anxious after learning of another 36 week IUFD.  Patient reports: Movement: Present Contractions: Not present  Objective   Flow sheet Vitals: Pulse Rate: 88 BP: 134/88 Total weight gain: 32 lb 11.2 oz (14.8 kg) Judy Conner April 18, 1986 [redacted]w[redacted]d  Fetus A Non-Stress  Test Interpretation for 11/03/23  Indication:  BMI  Fetal Heart Rate A Mode: External Baseline Rate (A): 150 bpm Variability: Moderate Accelerations: 15 x 15 Decelerations: None Scalp Stimulation: Negative Multiple birth?: No  Uterine Activity Mode: Toco Contraction Frequency (min): rare Contraction Duration (sec): 50 Contraction Quality: Mild Resting Time: Adequate  Interpretation (Fetal Testing) Nonstress Test Interpretation: Reactive Overall Impression: Reassuring for gestational age   General Appearance  No acute distress, well appearing, and well nourished Pulmonary   Normal work of breathing Neurologic   Alert and oriented to person, place, and time Psychiatric   Mood and affect within normal limits  Donato Fu, CNM  11/02/2509:29 AM

## 2023-11-03 NOTE — Assessment & Plan Note (Signed)
 Patient here today with some distress. She was upset that a her weight was said out loud. She read about a 36 week IUFD and feels very anxious. Reviewed reactive NST and the reasons why it was reassuring. Offered her extended monitoring in triage so that she could feel reassurance that her baby was doing well. Patient declines.  She was also concerned about her blood pressure. Reviewed all her blood pressures this pregnancy and all have been normal. Discussed normal pathology of blood pressure in pregnancy. Offered her extended blood pressure monitoring in triage for reassurance. Patient declines.

## 2023-11-04 LAB — CERVICOVAGINAL ANCILLARY ONLY
Chlamydia: NEGATIVE
Comment: NEGATIVE
Comment: NORMAL
Neisseria Gonorrhea: NEGATIVE

## 2023-11-05 LAB — STREP GP B NAA: Strep Gp B NAA: POSITIVE — AB

## 2023-11-09 ENCOUNTER — Ambulatory Visit
Admission: RE | Admit: 2023-11-09 | Discharge: 2023-11-09 | Disposition: A | Discharge status: Home / Self Care | Source: Ambulatory Visit | Attending: Obstetrics | Admitting: Obstetrics

## 2023-11-09 DIAGNOSIS — O99213 Obesity complicating pregnancy, third trimester: Secondary | ICD-10-CM | POA: Insufficient documentation

## 2023-11-09 DIAGNOSIS — O0990 Supervision of high risk pregnancy, unspecified, unspecified trimester: Secondary | ICD-10-CM | POA: Diagnosis present

## 2023-11-10 ENCOUNTER — Other Ambulatory Visit

## 2023-11-10 ENCOUNTER — Ambulatory Visit (INDEPENDENT_AMBULATORY_CARE_PROVIDER_SITE_OTHER): Admitting: Obstetrics and Gynecology

## 2023-11-10 VITALS — BP 136/88 | HR 91 | Wt 252.9 lb

## 2023-11-10 DIAGNOSIS — O99213 Obesity complicating pregnancy, third trimester: Secondary | ICD-10-CM

## 2023-11-10 DIAGNOSIS — Z3A36 36 weeks gestation of pregnancy: Secondary | ICD-10-CM

## 2023-11-10 DIAGNOSIS — O0990 Supervision of high risk pregnancy, unspecified, unspecified trimester: Secondary | ICD-10-CM

## 2023-11-10 DIAGNOSIS — E669 Obesity, unspecified: Secondary | ICD-10-CM

## 2023-11-10 DIAGNOSIS — Z98891 History of uterine scar from previous surgery: Secondary | ICD-10-CM

## 2023-11-10 NOTE — Patient Instructions (Signed)

## 2023-11-10 NOTE — Progress Notes (Signed)
 [name]: NST today. Patient reports good fetal movement with pelvic pressure and swelling of hands and feet. No other concerns.  Judy Conner September 06, 1985 [redacted]w[redacted]d  Fetus A Non-Stress Test Interpretation for 11/10/23  Indication:  obesity  Fetal Heart Rate A Baseline Rate (A): 135 bpm Variability: Moderate Accelerations: 15 x 15 Decelerations: None Scalp Stimulation: Negative Multiple birth?: No  Uterine Activity Mode: Toco Contraction Frequency (min): Rare Contraction Duration (sec): 100 Contraction Quality: Mild Resting Time: Adequate  Interpretation (Fetal Testing) Nonstress Test Interpretation: Reactive Overall Impression: Reassuring for gestational age

## 2023-11-10 NOTE — Progress Notes (Signed)
 ROB:  EGA = 36.6.  Occasional contractions but no labor.Scheduled for repeat cesarean delivery on May 9. NST today reactive. Reports daily fetal movement. She is living 2 hours from here.  Signs and symptoms of labor discussed.  SROM discussed.

## 2023-11-17 ENCOUNTER — Ambulatory Visit (INDEPENDENT_AMBULATORY_CARE_PROVIDER_SITE_OTHER): Admitting: Obstetrics

## 2023-11-17 VITALS — BP 119/71 | HR 99 | Wt 256.0 lb

## 2023-11-17 DIAGNOSIS — E669 Obesity, unspecified: Secondary | ICD-10-CM

## 2023-11-17 DIAGNOSIS — Z98891 History of uterine scar from previous surgery: Secondary | ICD-10-CM

## 2023-11-17 DIAGNOSIS — O0990 Supervision of high risk pregnancy, unspecified, unspecified trimester: Secondary | ICD-10-CM

## 2023-11-17 DIAGNOSIS — Z3A37 37 weeks gestation of pregnancy: Secondary | ICD-10-CM

## 2023-11-17 DIAGNOSIS — O99213 Obesity complicating pregnancy, third trimester: Secondary | ICD-10-CM | POA: Diagnosis not present

## 2023-11-17 DIAGNOSIS — O34219 Maternal care for unspecified type scar from previous cesarean delivery: Secondary | ICD-10-CM

## 2023-11-17 NOTE — Progress Notes (Signed)
    Return Prenatal Note   Subjective  38 y.o. [id number] at [redacted]w[redacted]d presents for this follow-up prenatal visit. Pregnancy notable for prior CD x 3, AMA, BMI, marijuana and tobacco use, anemia, and GBS+.   Patient has been very stressed lately, son in hospital and a lot going on with her life. Is preparing for upcoming delivery/c-section and expresses desire for ON-Q pump.   Patient reports: Movement: (!) Decreased Contractions: Irritability Denies vaginal bleeding or leaking fluid. Objective  Flow sheet Vitals: Pulse Rate: 99 BP: 119/71 Fundal Height: 38 cm Fetal Heart Rate (bpm): 160 Total weight gain: 35 lb (15.9 kg)  General Appearance  No acute distress, well appearing, and well nourished Pulmonary   Normal work of breathing Neurologic   Alert and oriented to person, place, and time Psychiatric   Mood and affect within normal limits  Fetus A Non-Stress Test Interpretation for 11/17/23 Indication:  Elevated BMI  Fetal Heart Rate A Mode: External Baseline Rate (A): 160 bpm Variability: Moderate Accelerations: 15 x 15 Decelerations: None Scalp Stimulation: Negative Multiple birth?: No  Uterine Activity Mode: Toco Contraction Frequency (min): UI  Interpretation (Fetal Testing) Nonstress Test Interpretation: Reactive Overall Impression: Reassuring for gestational age  Assessment/Plan   Plan  38 y.o. [id number] at [redacted]w[redacted]d by LMP=6wk US  presents for follow-up OB visit. Reviewed prenatal record including previous visit note.  1. Supervision of high risk pregnancy, antepartum (Primary) 2. Obesity affecting pregnancy in third trimester, unspecified obesity type [O99.213] -NST reactive today -Last growth 4/23, [redacted]w[redacted]d: EFW 61%ile, AFI 12.9  3. History of cesarean delivery -Scheduled for 5/9; planning for clear drape, long cord segment for FOB, post-placental ParaGard, and On-Q.  -Pre-admit appt scheduled 5/5 -Labor precautions given  Future Appointments  Date Time  Provider Department Center  11/21/2023  8:00 AM [facility] PAT2 [facility] None  12/07/2023  8:55 AM Sofia Dunn, MD [facility] None   For next visit:   rCD on 5/9    Sofia Dunn, DO [facility]

## 2023-11-17 NOTE — Patient Instructions (Signed)

## 2023-11-21 ENCOUNTER — Encounter
Admission: RE | Admit: 2023-11-21 | Discharge: 2023-11-21 | Disposition: A | Discharge status: Home / Self Care | Source: Ambulatory Visit | Attending: Obstetrics | Admitting: Obstetrics

## 2023-11-21 ENCOUNTER — Other Ambulatory Visit: Payer: Self-pay

## 2023-11-21 NOTE — Patient Instructions (Addendum)
 Your procedure is scheduled on: Friday 11/25/23  Arrival Time: Please call Labor and Delivery the day before your scheduled C-Section to find out your arrival time. [phone].  Arrival: If your arrival time is prior to 6:00 am, please enter through the Emergency Room Entrance and you will be directed to Labor and Delivery. If your arrival time is 6:00 am or later, please enter the Medical Mall and follow the greeter's instructions.  REMEMBER: Instructions that are not followed completely may result in serious medical risk, up to and including death; or upon the discretion of your surgeon and anesthesiologist your surgery may need to be rescheduled.  Do not eat food or drink fluids after midnight the night before surgery.  No gum chewing or hard candies.   One week prior to surgery: Stop Anti-inflammatories (NSAIDS) such as Advil , Aleve , Ibuprofen , Motrin , Naproxen , Naprosyn  and Aspirin  based products such as Excedrin, Goody's Powder, BC Powder. Stop ANY OVER THE COUNTER supplements until after surgery.  You may however, continue to take Tylenol  if needed for pain up until the day of surgery.   Continue taking all of your other prescription medications up until the day of surgery.  ON THE DAY OF SURGERY ONLY TAKE THESE MEDICATIONS WITH SIPS OF WATER:  famotidine  (PEPCID ) 20 MG    No Alcohol for 24 hours before or after surgery.  No Smoking including e-cigarettes for 24 hours prior to surgery.  No chewable tobacco products for at least 6 hours prior to surgery.  No nicotine  patches on the day of surgery.  Do not use any "recreational" drugs for at least a week prior to your surgery.  Please be advised that the combination of cocaine and anesthesia may have negative outcomes, up to and including death. If you test positive for cocaine, your surgery will be cancelled.  On the morning of surgery brush your teeth with toothpaste and water, you may rinse your mouth with mouthwash if  you wish. Do not swallow any toothpaste or mouthwash.  Use CHG wipes as directed on instruction sheet.  Do not wear jewelry, make-up, hairpins, clips or nail polish.  For welded (permanent) jewelry: bracelets, anklets, waist bands, etc.  Please have this removed prior to surgery.  If it is not removed, there is a chance that hospital personnel will need to cut it off on the day of surgery.  Do not wear lotions, powders, or perfumes.   Do not shave body hair from the neck down 48 hours before surgery.  Contact lenses, hearing aids and dentures may not be worn into surgery.  Do not bring valuables to the hospital. [facility] is not responsible for any missing/lost belongings or valuables.   Notify your doctor if there is any change in your medical condition (cold, fever, infection).  Wear comfortable clothing (specific to your surgery type) to the hospital.  After surgery, you can help prevent lung complications by doing breathing exercises.  Take deep breaths and cough every 1-2 hours. Your doctor may order a device called an Incentive Spirometer to help you take deep breaths. When coughing or sneezing, hold a pillow firmly against your incision with both hands. This is called "splinting." Doing this helps protect your incision. It also decreases belly discomfort.  Please call the Pre-admissions Testing Dept. at [phone] if you have any questions about these instructions.  Surgery Visitation Policy:  Visitor Passes   All visitors, including children, need an identification sticker when visiting. These stickers must be worn where  they can be seen.   Labor & Delivery  Laboring women may have one designated support person and two other visitors of any age visit. The support person must remain the same. The visitors may switch with other visitors. Visitation is permitted 24 hours per day. The designated support person or a visitor over the age of 16 may sleep overnight in the  patient's room. A doula registered with [facility] for labor and delivery support is not considered a visitor. Doulas not registered with [facility] are considered visitors.  Mother Baby Unit, OB Specialty and Gynecological Care  A designated support person and three visitors of any age may visit. The three visitors may switch out. The designated support person or a visitor age 62 or older may stay overnight in the room. During the postpartum period (up to 6 weeks), if the mother is the patient, she can have her newborn stay with her if there is another support person present who can be responsible for the baby.   Preparing the Skin Before Surgery     To help prevent the risk of infection at your surgical site, we are now providing you with rinse-free Sage 2% Chlorhexidine  Gluconate (CHG) disposable wipes.  Chlorhexidine  Gluconate (CHG) Soap  o An antiseptic cleaner that kills germs and bonds with the skin to continue killing germs even after washing  o Used for showering the night before surgery and morning of surgery  The night before surgery: Shower or bathe with warm water. Do not apply perfume, lotions, powders. Wait one hour after shower. Skin should be dry and cool. Open Sage wipe package - use 6 disposable cloths. Wipe body using one cloth for the right arm, one cloth for the left arm, one cloth for the right leg, one cloth for the left leg, one cloth for the chest/abdomen area, and one cloth for the back. Do not use on open wounds or sores. Do not use on face or genitals (private parts). If you are breast feeding, do not use on breasts. 5. Do not rinse, allow to dry. 6. Skin may feel "tacky" for several minutes. 7. Dress in clean clothes. 8. Place clean sheets on your bed and do not sleep with pets.  REPEAT ABOVE ON THE MORNING OF SURGERY BEFORE ARRIVING TO THE HOSPITAL.

## 2023-11-23 NOTE — Telephone Encounter (Signed)
 Closing note

## 2023-11-24 ENCOUNTER — Encounter
Admission: RE | Admit: 2023-11-24 | Discharge: 2023-11-24 | Disposition: A | Discharge status: Home / Self Care | Source: Ambulatory Visit | Attending: Obstetrics | Admitting: Obstetrics

## 2023-11-24 DIAGNOSIS — O34219 Maternal care for unspecified type scar from previous cesarean delivery: Secondary | ICD-10-CM | POA: Diagnosis present

## 2023-11-24 DIAGNOSIS — Z30430 Encounter for insertion of intrauterine contraceptive device: Secondary | ICD-10-CM

## 2023-11-24 DIAGNOSIS — Z8249 Family history of ischemic heart disease and other diseases of the circulatory system: Secondary | ICD-10-CM

## 2023-11-24 DIAGNOSIS — O09523 Supervision of elderly multigravida, third trimester: Secondary | ICD-10-CM | POA: Diagnosis present

## 2023-11-24 DIAGNOSIS — Z833 Family history of diabetes mellitus: Secondary | ICD-10-CM

## 2023-11-24 DIAGNOSIS — Z3A39 39 weeks gestation of pregnancy: Secondary | ICD-10-CM

## 2023-11-24 DIAGNOSIS — O99824 Streptococcus B carrier state complicating childbirth: Secondary | ICD-10-CM | POA: Diagnosis present

## 2023-11-24 DIAGNOSIS — O99334 Smoking (tobacco) complicating childbirth: Secondary | ICD-10-CM | POA: Diagnosis present

## 2023-11-24 DIAGNOSIS — K219 Gastro-esophageal reflux disease without esophagitis: Secondary | ICD-10-CM | POA: Diagnosis present

## 2023-11-24 DIAGNOSIS — F17210 Nicotine dependence, cigarettes, uncomplicated: Secondary | ICD-10-CM | POA: Diagnosis present

## 2023-11-24 DIAGNOSIS — Z3A38 38 weeks gestation of pregnancy: Secondary | ICD-10-CM | POA: Diagnosis not present

## 2023-11-24 DIAGNOSIS — O99214 Obesity complicating childbirth: Secondary | ICD-10-CM | POA: Diagnosis present

## 2023-11-24 DIAGNOSIS — O34211 Maternal care for low transverse scar from previous cesarean delivery: Principal | ICD-10-CM | POA: Diagnosis present

## 2023-11-24 DIAGNOSIS — O9902 Anemia complicating childbirth: Secondary | ICD-10-CM | POA: Diagnosis present

## 2023-11-24 DIAGNOSIS — E66813 Obesity, class 3: Secondary | ICD-10-CM | POA: Diagnosis present

## 2023-11-24 DIAGNOSIS — O9962 Diseases of the digestive system complicating childbirth: Secondary | ICD-10-CM | POA: Diagnosis present

## 2023-11-24 DIAGNOSIS — Z98891 History of uterine scar from previous surgery: Secondary | ICD-10-CM

## 2023-11-24 LAB — CBC
HCT: 37.1 % (ref 36.0–46.0)
Hemoglobin: 12.1 g/dL (ref 12.0–15.0)
MCH: 30.1 pg (ref 26.0–34.0)
MCHC: 32.6 g/dL (ref 30.0–36.0)
MCV: 92.3 fL (ref 80.0–100.0)
Platelets: 248 10*3/uL (ref 150–400)
RBC: 4.02 MIL/uL (ref 3.87–5.11)
RDW: 17.7 % — ABNORMAL HIGH (ref 11.5–15.5)
WBC: 11.1 10*3/uL — ABNORMAL HIGH (ref 4.0–10.5)
nRBC: 0 % (ref 0.0–0.2)

## 2023-11-24 MED ORDER — POVIDONE-IODINE 10 % EX SWAB: 2.0000 | Freq: Once | CUTANEOUS | Status: DC

## 2023-11-24 MED ORDER — ORAL CARE MOUTH RINSE: 15.0000 mL | Freq: Once | OROMUCOSAL | Status: AC

## 2023-11-24 MED ORDER — BUPIVACAINE HCL (PF) 0.5 % IJ SOLN
5.0000 mL | Freq: Once | INTRAMUSCULAR | Status: DC
  Filled 2023-11-24: qty 10

## 2023-11-24 MED ORDER — BUPIVACAINE 0.25 % ON-Q PUMP DUAL CATH 400 ML
400.0000 mL | INJECTION | Status: DC
  Filled 2023-11-24: qty 400

## 2023-11-24 MED ORDER — CHLORHEXIDINE GLUCONATE 0.12 % MT SOLN
15.0000 mL | Freq: Once | OROMUCOSAL | Status: AC
  Administered 2023-11-25: 15 mL via OROMUCOSAL
  Filled 2023-11-24: qty 15

## 2023-11-24 MED ORDER — SOD CITRATE-CITRIC ACID 500-334 MG/5ML PO SOLN
30.0000 mL | ORAL | Status: AC
  Administered 2023-11-25: 30 mL via ORAL

## 2023-11-24 MED ORDER — ACETAMINOPHEN 500 MG PO TABS
1000.0000 mg | ORAL_TABLET | ORAL | Status: AC
  Administered 2023-11-25: 1000 mg via ORAL
  Filled 2023-11-24: qty 2

## 2023-11-24 MED ORDER — SODIUM CHLORIDE 0.9 % IV SOLN
2.0000 g | INTRAVENOUS | Status: AC
  Administered 2023-11-25: 2 g via INTRAVENOUS
  Filled 2023-11-24: qty 2

## 2023-11-24 MED ORDER — LACTATED RINGERS IV SOLN: INTRAVENOUS | Status: DC

## 2023-11-25 ENCOUNTER — Encounter
Admission: RE | Disposition: A | Discharge status: Home / Self Care | Payer: Self-pay | Source: Home / Self Care | Attending: Advanced Practice Midwife

## 2023-11-25 ENCOUNTER — Inpatient Hospital Stay
Admission: RE | Admit: 2023-11-25 | Discharge: 2023-11-27 | DRG: 787 | Disposition: A | Discharge status: Home / Self Care | Attending: Advanced Practice Midwife | Admitting: Advanced Practice Midwife

## 2023-11-25 ENCOUNTER — Encounter: Payer: Self-pay | Admitting: Obstetrics

## 2023-11-25 ENCOUNTER — Inpatient Hospital Stay: Admitting: Anesthesiology

## 2023-11-25 ENCOUNTER — Other Ambulatory Visit: Payer: Self-pay

## 2023-11-25 DIAGNOSIS — E669 Obesity, unspecified: Secondary | ICD-10-CM

## 2023-11-25 DIAGNOSIS — O99214 Obesity complicating childbirth: Secondary | ICD-10-CM | POA: Diagnosis present

## 2023-11-25 DIAGNOSIS — O99820 Streptococcus B carrier state complicating pregnancy: Secondary | ICD-10-CM

## 2023-11-25 DIAGNOSIS — O9962 Diseases of the digestive system complicating childbirth: Secondary | ICD-10-CM | POA: Diagnosis present

## 2023-11-25 DIAGNOSIS — O34211 Maternal care for low transverse scar from previous cesarean delivery: Secondary | ICD-10-CM | POA: Diagnosis not present

## 2023-11-25 DIAGNOSIS — O09523 Supervision of elderly multigravida, third trimester: Secondary | ICD-10-CM | POA: Diagnosis not present

## 2023-11-25 DIAGNOSIS — Z30432 Encounter for removal of intrauterine contraceptive device: Secondary | ICD-10-CM | POA: Diagnosis not present

## 2023-11-25 DIAGNOSIS — O34219 Maternal care for unspecified type scar from previous cesarean delivery: Secondary | ICD-10-CM | POA: Diagnosis present

## 2023-11-25 DIAGNOSIS — Z3A39 39 weeks gestation of pregnancy: Secondary | ICD-10-CM

## 2023-11-25 DIAGNOSIS — E66813 Obesity, class 3: Secondary | ICD-10-CM | POA: Diagnosis present

## 2023-11-25 DIAGNOSIS — Z30430 Encounter for insertion of intrauterine contraceptive device: Secondary | ICD-10-CM

## 2023-11-25 DIAGNOSIS — F17210 Nicotine dependence, cigarettes, uncomplicated: Secondary | ICD-10-CM | POA: Diagnosis present

## 2023-11-25 DIAGNOSIS — Z833 Family history of diabetes mellitus: Secondary | ICD-10-CM

## 2023-11-25 DIAGNOSIS — O0990 Supervision of high risk pregnancy, unspecified, unspecified trimester: Principal | ICD-10-CM

## 2023-11-25 DIAGNOSIS — K219 Gastro-esophageal reflux disease without esophagitis: Secondary | ICD-10-CM | POA: Diagnosis present

## 2023-11-25 DIAGNOSIS — O99334 Smoking (tobacco) complicating childbirth: Secondary | ICD-10-CM | POA: Diagnosis present

## 2023-11-25 DIAGNOSIS — Z8249 Family history of ischemic heart disease and other diseases of the circulatory system: Secondary | ICD-10-CM | POA: Diagnosis not present

## 2023-11-25 DIAGNOSIS — N736 Female pelvic peritoneal adhesions (postinfective): Secondary | ICD-10-CM

## 2023-11-25 DIAGNOSIS — O99824 Streptococcus B carrier state complicating childbirth: Secondary | ICD-10-CM | POA: Diagnosis present

## 2023-11-25 DIAGNOSIS — O9902 Anemia complicating childbirth: Secondary | ICD-10-CM | POA: Diagnosis present

## 2023-11-25 LAB — RPR: RPR Ser Ql: NONREACTIVE

## 2023-11-25 LAB — PREPARE RBC (CROSSMATCH)

## 2023-11-25 LAB — URINE DRUG SCREEN, QUALITATIVE (ARMC ONLY)
Amphetamines, Ur Screen: NOT DETECTED
Barbiturates, Ur Screen: NOT DETECTED
Benzodiazepine, Ur Scrn: NOT DETECTED
Cannabinoid 50 Ng, Ur ~~LOC~~: NOT DETECTED
Cocaine Metabolite,Ur ~~LOC~~: NOT DETECTED
MDMA (Ecstasy)Ur Screen: NOT DETECTED
Methadone Scn, Ur: NOT DETECTED
Opiate, Ur Screen: NOT DETECTED
Phencyclidine (PCP) Ur S: NOT DETECTED
Tricyclic, Ur Screen: NOT DETECTED

## 2023-11-25 SURGERY — Surgical Case
Anesthesia: Spinal

## 2023-11-25 MED ORDER — MISOPROSTOL 200 MCG PO TABS: 800.0000 ug | ORAL_TABLET | Freq: Once | ORAL | Status: DC

## 2023-11-25 MED ORDER — TRANEXAMIC ACID-NACL 1000-0.7 MG/100ML-% IV SOLN
INTRAVENOUS | Status: AC
  Filled 2023-11-25: qty 100

## 2023-11-25 MED ORDER — KETOROLAC TROMETHAMINE 30 MG/ML IJ SOLN: 30.0000 mg | Freq: Four times a day (QID) | INTRAMUSCULAR | Status: AC | PRN

## 2023-11-25 MED ORDER — METHYLERGONOVINE MALEATE 0.2 MG/ML IJ SOLN
INTRAMUSCULAR | Status: AC
  Filled 2023-11-25: qty 1

## 2023-11-25 MED ORDER — ONDANSETRON HCL 4 MG/2ML IJ SOLN: 4.0000 mg | Freq: Three times a day (TID) | INTRAMUSCULAR | Status: DC | PRN

## 2023-11-25 MED ORDER — ONDANSETRON HCL 4 MG/2ML IJ SOLN
INTRAMUSCULAR | Status: DC | PRN
  Administered 2023-11-25: 4 mg via INTRAVENOUS

## 2023-11-25 MED ORDER — ENOXAPARIN SODIUM 40 MG/0.4ML IJ SOSY
40.0000 mg | PREFILLED_SYRINGE | INTRAMUSCULAR | Status: DC
  Administered 2023-11-27: 40 mg via SUBCUTANEOUS
  Filled 2023-11-25 (×2): qty 0.4

## 2023-11-25 MED ORDER — NALOXONE HCL 0.4 MG/ML IJ SOLN: 0.4000 mg | INTRAMUSCULAR | Status: DC | PRN

## 2023-11-25 MED ORDER — FENTANYL CITRATE (PF) 100 MCG/2ML IJ SOLN: 100.0000 ug | Freq: Once | INTRAMUSCULAR | Status: AC

## 2023-11-25 MED ORDER — FENTANYL CITRATE (PF) 100 MCG/2ML IJ SOLN
INTRAMUSCULAR | Status: AC
  Filled 2023-11-25: qty 2

## 2023-11-25 MED ORDER — MISOPROSTOL 200 MCG PO TABS
ORAL_TABLET | ORAL | Status: AC
  Filled 2023-11-25: qty 1

## 2023-11-25 MED ORDER — DIPHENHYDRAMINE HCL 25 MG PO CAPS: 25.0000 mg | ORAL_CAPSULE | ORAL | Status: DC | PRN

## 2023-11-25 MED ORDER — SIMETHICONE 80 MG PO CHEW: 80.0000 mg | CHEWABLE_TABLET | ORAL | Status: DC | PRN

## 2023-11-25 MED ORDER — FENTANYL CITRATE (PF) 100 MCG/2ML IJ SOLN
INTRAMUSCULAR | Status: AC
  Administered 2023-11-25: 100 ug via INTRAVENOUS
  Filled 2023-11-25: qty 2

## 2023-11-25 MED ORDER — OXYTOCIN-SODIUM CHLORIDE 30-0.9 UT/500ML-% IV SOLN
INTRAVENOUS | Status: AC
  Filled 2023-11-25: qty 500

## 2023-11-25 MED ORDER — MORPHINE SULFATE (PF) 0.5 MG/ML IJ SOLN
INTRAMUSCULAR | Status: AC
  Filled 2023-11-25: qty 10

## 2023-11-25 MED ORDER — BUPIVACAINE ON-Q PAIN PUMP (FOR ORDER SET NO CHG)
INJECTION | Status: DC
  Filled 2023-11-25: qty 1

## 2023-11-25 MED ORDER — ACETAMINOPHEN 500 MG PO TABS: 1000.0000 mg | ORAL_TABLET | Freq: Four times a day (QID) | ORAL | Status: DC

## 2023-11-25 MED ORDER — SOD CITRATE-CITRIC ACID 500-334 MG/5ML PO SOLN
ORAL | Status: AC
  Filled 2023-11-25: qty 15

## 2023-11-25 MED ORDER — DIPHENHYDRAMINE HCL 50 MG/ML IJ SOLN: 12.5000 mg | INTRAMUSCULAR | Status: DC | PRN

## 2023-11-25 MED ORDER — SODIUM CHLORIDE 0.9% FLUSH: 3.0000 mL | INTRAVENOUS | Status: DC | PRN

## 2023-11-25 MED ORDER — TRANEXAMIC ACID-NACL 1000-0.7 MG/100ML-% IV SOLN
1000.0000 mg | INTRAVENOUS | Status: AC
  Administered 2023-11-25: 1000 mg via INTRAVENOUS

## 2023-11-25 MED ORDER — LACTATED RINGERS IV SOLN: INTRAVENOUS | Status: DC

## 2023-11-25 MED ORDER — KETOROLAC TROMETHAMINE 30 MG/ML IJ SOLN
INTRAMUSCULAR | Status: DC | PRN
  Administered 2023-11-25: 30 mg via INTRAVENOUS

## 2023-11-25 MED ORDER — PHENYLEPHRINE HCL-NACL 20-0.9 MG/250ML-% IV SOLN
INTRAVENOUS | Status: DC | PRN
  Administered 2023-11-25: 50 ug/min via INTRAVENOUS

## 2023-11-25 MED ORDER — DIBUCAINE (PERIANAL) 1 % EX OINT: 1.0000 | TOPICAL_OINTMENT | CUTANEOUS | Status: DC | PRN

## 2023-11-25 MED ORDER — PHENYLEPHRINE 80 MCG/ML (10ML) SYRINGE FOR IV PUSH (FOR BLOOD PRESSURE SUPPORT)
PREFILLED_SYRINGE | INTRAVENOUS | Status: AC
  Filled 2023-11-25: qty 10

## 2023-11-25 MED ORDER — MENTHOL 3 MG MT LOZG
1.0000 | LOZENGE | OROMUCOSAL | Status: DC | PRN
  Administered 2023-11-27: 3 mg via ORAL
  Filled 2023-11-25: qty 9

## 2023-11-25 MED ORDER — FENTANYL CITRATE (PF) 100 MCG/2ML IJ SOLN
INTRAMUSCULAR | Status: AC
  Administered 2023-11-25: 100 ug
  Filled 2023-11-25: qty 2

## 2023-11-25 MED ORDER — SIMETHICONE 80 MG PO CHEW
80.0000 mg | CHEWABLE_TABLET | Freq: Three times a day (TID) | ORAL | Status: DC
  Administered 2023-11-25 (×2): 80 mg via ORAL
  Filled 2023-11-25 (×2): qty 1

## 2023-11-25 MED ORDER — BUPIVACAINE IN DEXTROSE 0.75-8.25 % IT SOLN
INTRATHECAL | Status: DC | PRN
  Administered 2023-11-25: 1.6 mL via INTRATHECAL

## 2023-11-25 MED ORDER — BUPIVACAINE HCL 0.5 % IJ SOLN
INTRAMUSCULAR | Status: DC | PRN
  Administered 2023-11-25: 10 mL

## 2023-11-25 MED ORDER — OXYTOCIN-SODIUM CHLORIDE 30-0.9 UT/500ML-% IV SOLN
2.5000 [IU]/h | INTRAVENOUS | Status: AC
  Administered 2023-11-25: 2.5 [IU]/h via INTRAVENOUS
  Filled 2023-11-25: qty 500

## 2023-11-25 MED ORDER — CEFAZOLIN SODIUM-DEXTROSE 2-4 GM/100ML-% IV SOLN
INTRAVENOUS | Status: AC
  Filled 2023-11-25: qty 100

## 2023-11-25 MED ORDER — WITCH HAZEL-GLYCERIN EX PADS: 1.0000 | MEDICATED_PAD | CUTANEOUS | Status: DC | PRN

## 2023-11-25 MED ORDER — MEPERIDINE HCL 25 MG/ML IJ SOLN: 6.2500 mg | INTRAMUSCULAR | Status: DC | PRN

## 2023-11-25 MED ORDER — ONDANSETRON HCL 4 MG/2ML IJ SOLN
INTRAMUSCULAR | Status: AC
Start: 2023-11-25 — End: ?
  Filled 2023-11-25: qty 2

## 2023-11-25 MED ORDER — METHYLERGONOVINE MALEATE 0.2 MG/ML IJ SOLN
0.2000 mg | Freq: Once | INTRAMUSCULAR | Status: AC
  Administered 2023-11-25: 0.2 mg via INTRAMUSCULAR

## 2023-11-25 MED ORDER — PARAGARD INTRAUTERINE COPPER IU IUD
INTRAUTERINE_SYSTEM | Freq: Once | INTRAUTERINE | Status: AC
  Administered 2023-11-25: 1 via INTRAUTERINE
  Filled 2023-11-25: qty 1

## 2023-11-25 MED ORDER — FENTANYL CITRATE (PF) 100 MCG/2ML IJ SOLN
INTRAMUSCULAR | Status: DC | PRN
  Administered 2023-11-25: 15 ug via INTRATHECAL

## 2023-11-25 MED ORDER — PRENATAL MULTIVITAMIN CH
1.0000 | ORAL_TABLET | Freq: Every day | ORAL | Status: DC
  Administered 2023-11-25: 1 via ORAL
  Filled 2023-11-25: qty 1

## 2023-11-25 MED ORDER — NALOXONE HCL 4 MG/10ML IJ SOLN: 1.0000 ug/kg/h | INTRAVENOUS | Status: DC | PRN

## 2023-11-25 MED ORDER — DIPHENHYDRAMINE HCL 25 MG PO CAPS: 25.0000 mg | ORAL_CAPSULE | Freq: Four times a day (QID) | ORAL | Status: DC | PRN

## 2023-11-25 MED ORDER — COCONUT OIL OIL: 1.0000 | TOPICAL_OIL | Status: DC | PRN

## 2023-11-25 MED ORDER — ACETAMINOPHEN 500 MG PO TABS
1000.0000 mg | ORAL_TABLET | Freq: Four times a day (QID) | ORAL | Status: DC
  Administered 2023-11-25 – 2023-11-27 (×7): 1000 mg via ORAL
  Filled 2023-11-25 (×7): qty 2

## 2023-11-25 MED ORDER — SENNOSIDES-DOCUSATE SODIUM 8.6-50 MG PO TABS: 2.0000 | ORAL_TABLET | Freq: Every day | ORAL | Status: DC

## 2023-11-25 MED ORDER — IBUPROFEN 600 MG PO TABS: 600.0000 mg | ORAL_TABLET | Freq: Four times a day (QID) | ORAL | Status: DC

## 2023-11-25 MED ORDER — OXYTOCIN-SODIUM CHLORIDE 30-0.9 UT/500ML-% IV SOLN
INTRAVENOUS | Status: DC | PRN
  Administered 2023-11-25: 30 [IU] via INTRAVENOUS

## 2023-11-25 MED ORDER — 0.9 % SODIUM CHLORIDE (POUR BTL) OPTIME
TOPICAL | Status: DC | PRN
  Administered 2023-11-25: 300 mL

## 2023-11-25 MED ORDER — CALCIUM CARBONATE ANTACID 500 MG PO CHEW: 2.0000 | CHEWABLE_TABLET | Freq: Three times a day (TID) | ORAL | Status: DC | PRN

## 2023-11-25 MED ORDER — SCOPOLAMINE 1 MG/3DAYS TD PT72: 1.0000 | MEDICATED_PATCH | Freq: Once | TRANSDERMAL | Status: DC

## 2023-11-25 MED ORDER — OXYCODONE HCL 5 MG PO TABS
5.0000 mg | ORAL_TABLET | ORAL | Status: DC | PRN
  Administered 2023-11-25: 5 mg via ORAL
  Administered 2023-11-25 – 2023-11-27 (×4): 10 mg via ORAL
  Filled 2023-11-25 (×2): qty 2
  Filled 2023-11-25: qty 1
  Filled 2023-11-25 (×2): qty 2

## 2023-11-25 MED ORDER — MORPHINE SULFATE (PF) 0.5 MG/ML IJ SOLN
INTRAMUSCULAR | Status: DC | PRN
  Administered 2023-11-25: .1 mg via INTRATHECAL

## 2023-11-25 MED ORDER — PIPERACILLIN-TAZOBACTAM 3.375 G IVPB
3.3750 g | Freq: Once | INTRAVENOUS | Status: AC
  Administered 2023-11-25: 3.375 g via INTRAVENOUS
  Filled 2023-11-25: qty 50

## 2023-11-25 MED ORDER — SODIUM CHLORIDE 0.9% IV SOLUTION: Freq: Once | INTRAVENOUS | Status: AC

## 2023-11-25 MED ORDER — KETOROLAC TROMETHAMINE 30 MG/ML IJ SOLN
30.0000 mg | Freq: Four times a day (QID) | INTRAMUSCULAR | Status: AC
  Administered 2023-11-25 (×2): 30 mg via INTRAVENOUS
  Filled 2023-11-25 (×2): qty 1

## 2023-11-25 SURGICAL SUPPLY — 28 items
BAG COUNTER SPONGE SURGICOUNT (BAG) ×1 IMPLANT
BENZOIN TINCTURE PRP APPL 2/3 (GAUZE/BANDAGES/DRESSINGS) IMPLANT
CHLORAPREP W/TINT 26 (MISCELLANEOUS) ×2 IMPLANT
CLOSURE STERI-STRIP 1/4X4 (GAUZE/BANDAGES/DRESSINGS) IMPLANT
DERMABOND ADVANCED .7 DNX12 (GAUZE/BANDAGES/DRESSINGS) IMPLANT
DRESSING PEEL AND PLAC PRVNA20 (GAUZE/BANDAGES/DRESSINGS) IMPLANT
DRSG TELFA 3X8 NADH STRL (GAUZE/BANDAGES/DRESSINGS) ×1 IMPLANT
ELECTRODE REM PT RTRN 9FT ADLT (ELECTROSURGICAL) ×1 IMPLANT
GAUZE SPONGE 4X4 12PLY STRL (GAUZE/BANDAGES/DRESSINGS) ×1 IMPLANT
GLOVE BIO SURGEON STRL SZ 6 (GLOVE) ×1 IMPLANT
GLOVE BIOGEL PI IND STRL 6 (GLOVE) ×1 IMPLANT
GOWN STRL REUS W/ TWL LRG LVL3 (GOWN DISPOSABLE) ×2 IMPLANT
KIT PREVENA INCISION MGT20CM45 (CANNISTER) IMPLANT
KIT TURNOVER KIT A (KITS) ×1 IMPLANT
MANIFOLD NEPTUNE II (INSTRUMENTS) ×1 IMPLANT
MAT PREVALON FULL STRYKER (MISCELLANEOUS) ×1 IMPLANT
NS IRRIG 1000ML POUR BTL (IV SOLUTION) ×1 IMPLANT
PACK C SECTION AR (MISCELLANEOUS) ×1 IMPLANT
PAD OB MATERNITY 11 LF (PERSONAL CARE ITEMS) ×1 IMPLANT
PAD PREP OB/GYN DISP 24X41 (PERSONAL CARE ITEMS) ×1 IMPLANT
SCRUB CHG 4% DYNA-HEX 4OZ (MISCELLANEOUS) ×1 IMPLANT
STRIP CLOSURE SKIN 1/2X4 (GAUZE/BANDAGES/DRESSINGS) IMPLANT
SUT MNCRL AB 4-0 PS2 18 (SUTURE) ×1 IMPLANT
SUT VIC AB 0 CT1 36 (SUTURE) ×4 IMPLANT
SUT VIC AB 3-0 SH 27X BRD (SUTURE) ×1 IMPLANT
SUT VICRYL 2-0 27IN ABS (SUTURE) IMPLANT
TRAP FLUID SMOKE EVACUATOR (MISCELLANEOUS) ×1 IMPLANT
WATER STERILE IRR 500ML POUR (IV SOLUTION) ×1 IMPLANT

## 2023-11-25 NOTE — Progress Notes (Signed)
 Was called to room by RN at 1830 to address postpartum bleeding. Came to room, patient had been sitting in chair and stood up and had large gushing of blood and clots. Placed in bed. Bleeding ceased with fundal massage. Blood loss measured and was 790g. Directly beforehand RN had called Dr. Dell Fennel who ordered patient to be given TXA and methergine IM. Patient's fundus was +7. Called Dr. Dell Fennel to discuss patient as she had done the c/s and was just called by the RN and ask to be called back with up dates after I assessed her. After speaking with Dr. Dell Fennel we decided to start pitocin IV at a slow rate, and give cytotec PR. Dr. Dell Fennel was on her way in to assess patient. After assessment decision was made for Jada to be placed, was placed by Dr. Dell Fennel, an additional 280g of blood loss. After Jada placement fundus was +4 and firm. Bleeding currently stable. See Dr. Raechel Bulla note for further details.  Lou Rounds CNM 11/25/23

## 2023-11-25 NOTE — Op Note (Signed)
 CESAREAN SECTION OPERATIVE REPORT   DATE OF SURGERY: 11/25/23  SURGEON: Sofia Dunn, DO ASSISTANT:  Climmie Damme Cowen,CNM ANESTHESIA: Spinal  PROCEDURE: Repeat low transverse cesarean section Adhesiolysis Post-placental ParaGard IUD insertion  PREOPERATIVE DIAGNOSES: 1. Intrauterine pregnancy at [redacted]w[redacted]d 2. History of previous cesarean section x 3 3. Desires IUD contraception 4. Elevated BMI 5. AMA 6. GBS carrier  POSTOPERATIVE DIAGNOSES: 1. Same, s/p rLTCS 2. Adhesive disease  QBL:   840cc DRAINS: foley catheter to gravity drainage, 150 ml of clear urine at end of the procedure TOTAL IV FLUIDS: SPECIMENS: None COMPLICATIONS:  None  FINDINGS:  Viable female infant in cephalic presentation; APGARs 8/9; weight 2680 grams (5lbs, 14.5oz) Clear fluid at amniotomy Intact placenta with 3 vessel cord Uterus, tubes, and ovaries appeared normal  INDICATION and CONSENT: Judy Conner is a 38 y.o. [id number] with IUP at [redacted]w[redacted]d presenting for scheduled repeat cesarean. The patient understood that the risks of cesarean section include, but are not limited to, visceral or vascular injury, infection, blood loss and need for transfusion, prolonged hospitalization, and reoperation.  The patient stated understanding and desired to proceed.  All questions were answered.  PROCEDURE:  After verbal and written informed consent was obtained, the patient was taken to the operating room where spinal anesthesia was found to be adequate.  SCDs were applied to the lower extremities and a Foley catheter was placed in the bladder under sterile technique.  The patient was placed in dorsal supine position with a leftward tilt, prepped and draped in a sterile fashion.  Two grams of Cefotetan  were given for infection prophylaxis.  Level of anesthesia was confirmed to be adequate with Allis clamps.  A Pfannenstiel skin incision was made with the scalpel and carried down to the underlying layer of rectus  fascia.  The fascia was nicked bilaterally in the midline with the scalpel and the fascial incision was extended laterally using Mayo scissors.  The fascia and the rectus muscles were adhesed, more prominently on the right, requiring careful dissection in order to extend the fascial incision. The superior aspect of the fascia was grasped with Kocher clamps and the underlying rectus muscles were further dissected off with the Bovie. In a similar fashion, the inferior aspect of fascia was grasped with Kocher clamps and the underlying rectus and pyramidalis were dissected off sharply and bluntly.  The rectus muscles were separated in the midline bluntly.  There was not visible peritoneum to enter. A large adhesion from the fundus of the uterus down to the bladder was ligated and cut. The peritoneum was adhesed to the rectus. The bladder was adhesed to the uterus, covering the entire lower segment. These adhesions were carefully dissected and a bladder flap was developed with Metzenbaum scissors.   The Alexis retractor was then able to be inserted, with the bladder carefully tucked beneath the [employer]. The lower uterine segment was incised transversely with the scalpel.  The placenta was anterior and was also incised, the amniotic sac was ruptured and clear fluid noted. The uterine incision was extended bluntly in a cranial-caudal fashion.  The fetus was in cephalic presentation.  The head was flexed and elevated to the level of the uterrine incision.  Gentle fundal pressure was applied by the assistant and the head delivered without difficulty, a tight nuchal x 1 was noted and reduced, then with further funda, the rest of the body was easily delivered. Delayed cord clamping was performed for approximately 60 seconds, the infant was dried and stimulated,  and shown to the patient. The cord was doubly clamped and cut,  and cord blood obtained.  The infant was handed to the awaiting NICU team.  The placenta delivered  intact & spontaneously with manual massage of the uterine fundus. The uterus was left in situ.  The inside of the uterus was gently wiped with lap sponges x 3 ensuring complete removal of placental membranes.  The ParaGard IUD was then placed in the fundus, and the strings were guided toward the cervix with ring forceps. The uterine incision was repaired with a double layer closure of 0-Vicryl first in a locking fashion, followed by 0-Vicryl in an imbricating stitch.  The lower segment was very friable and tore easily with suture. Ensured a solid closure with excellent hemostasis achieved.  The ovaries and tubes were found to be grossly normal. Blood clots, debris and fluid were cleaned from the abdomen, gutters, and pelvis with moist laparotomy sponges.  The uterine incision was reinspected and was hemostatic.   The On-Q catheter pump x 1 was inserted in accordance with the manufacturer's recommendations.  The catheters were inserted approximately 4cm cephelad to the incision line. It was inserted to a depth of the 4th mark. It was positioned over the uterine fundus.    The superior and inferior fascia were grasped with Kocher clamps and the rectus muscles were examined and found to be hemostatic, ensured with Bovie electrocautery.  The peritoneum was closed 3-0 Vicryl in a continuous, running fashion, and the rectus muscles reapproximated well without suture.  The fascial layer was closed with 0-Vicryl in a running fashion.  The subcutaneous tissue was irrigated, made hemostatic with Bovie electrocautery, then reapproximated with a running layer of 3-0 Plain. The skin was closed subcuticularly with 4-0 Vicryl on a keith and steristrips and a Prevena wound vac was placed.  The On-Q catheter was bolused with 5 mL of 0.5% marcaine  plain.  The catheter was affixed to the skin with surgical skin glue, steri-strips, and tegaderm.  All sponge, lap and instrument counts were correct x 2. Advised patient of the  extensive adhesions present and the friable nature of her lower uterine segment; due to these factors, strongly recommend she consider no future pregnancies. She voiced understanding. The patient tolerated the procedure well and was taken to the recovery room in stable condition.  An experienced assistant was required given the standard of surgical care given the complexity of the case.  This assistant was needed for exposure, dissection, suctioning, retraction, instrument exchange, and for overall help during the procedure.   Sofia Dunn, DO [facility]

## 2023-11-25 NOTE — Progress Notes (Signed)
 Had Carry Clapper CNM assist with assessing Jada placement. Blood was leaking out around [place] tube. Therefore repositioned and placed in cuff. Hooked to suction. Small amount of blood was present in [place] tube. Patient was given fentanyl  for pain relief, tolerated readjustment. Pad was weighed and had 50g of blood since last change. Bringing total EBL including blood blood loss from OR to . Patient has first unit of two total units of blood infusing. Also has broad spectrum abx infusing.  Lou Rounds CNM 11/25/23 11:01 PM

## 2023-11-25 NOTE — Progress Notes (Signed)
 Notified by L&D RN Cherlyn Cornet that while pt on postpartum, had large clots out, requesting orders for tocolytic medications. Verbal orders given for Methergine, TXA, and asked them to call CNM Cowen to bedside to assess for [facility] placement. Subsequently called by Ava Lei that pt's bleeding had slowed, but upon fundal massage, she felt fundus was approx 7 above the umbilicus and requested to come in for assessment. Gave further verbal orders for Cytotec 800mcg, another bag of Pitocin to run over the next 8hrs. Upon arrival to bedside, US  performed and did not see any retained placenta, IUD visible, EMT looked appropriate for postpartum. Cervical exam revealed 3cm dilation and fundus was approx 5-6 above umbilicus. Discussed with pt placing the Jada and starting a blood transfusion, she agreed. Pt transported back to L&D. Jada placed and cervical cuff filled to 60cc, BT started (2u PRBC) and ordered Zosyn x 1. Plan to continue Methergine Q6hrs. CNM Cowen to monitor status of Jada, may need to fill cuff further if becoming displaced.   QBL:  840 in OR 790 on postpartum  280 once back on L&D Running total at time of note: 1910cc Signed out patient to Dr. Luster Salters, physician on call, who will assume management of this patient.

## 2023-11-25 NOTE — Anesthesia Preprocedure Evaluation (Addendum)
 Anesthesia Evaluation  Patient identified by MRN, date of birth, ID band Patient awake    Reviewed: Allergy & Precautions, NPO status , Patient's Chart, lab work & pertinent test results  History of Anesthesia Complications Negative for: history of anesthetic complications  Airway Mallampati: IV   Neck ROM: Full    Dental no notable dental hx.    Pulmonary Current Smoker (1/2 ppd at most) and Patient abstained from smoking.   Pulmonary exam normal breath sounds clear to auscultation       Cardiovascular Exercise Tolerance: Good Normal cardiovascular exam Rhythm:Regular Rate:Normal     Neuro/Psych  Headaches    GI/Hepatic ,GERD  ,,  Endo/Other    Class 3 obesity  Renal/GU      Musculoskeletal   Abdominal   Peds  Hematology  (+) Blood dyscrasia, anemia   Anesthesia Other Findings 38 yo [id number] at 52 0/7 presenting for repeat c-section.  Reproductive/Obstetrics (+) Pregnancy                             Anesthesia Physical Anesthesia Plan  ASA: 3  Anesthesia Plan: Spinal   Post-op Pain Management:    Induction:   PONV Risk Score and Plan: 2 and Ondansetron  and Treatment may vary due to age or medical condition  Airway Management Planned: Natural Airway and Nasal Cannula  Additional Equipment:   Intra-op Plan:   Post-operative Plan:   Informed Consent: I have reviewed the patients History and Physical, chart, labs and discussed the procedure including the risks, benefits and alternatives for the proposed anesthesia with the patient or authorized representative who has indicated his/her understanding and acceptance.     Dental Advisory Given  Plan Discussed with: Anesthesiologist, CRNA and Surgeon  Anesthesia Plan Comments: (Patient reports no bleeding problems and no anticoagulant use.  Plan for spinal with backup GA.  Patient consented for risks of anesthesia including  but not limited to:  - adverse reactions to medications - damage to eyes, teeth, lips or other oral mucosa - nerve damage due to positioning  - risk of bleeding, infection and or nerve damage from spinal that could lead to paralysis - risk of headache or failed spinal - damage to teeth, lips or other oral mucosa - sore throat or hoarseness - damage to heart, brain, nerves, lungs, other parts of body or loss of life  Patient voiced understanding and assent.)       Anesthesia Quick Evaluation

## 2023-11-25 NOTE — Transfer of Care (Signed)
 Immediate Anesthesia Transfer of Care Note  Patient: Judy Conner  Procedure(s) Performed: CESAREAN DELIVERY  Patient Location: PACU  Anesthesia Type:Spinal  Level of Consciousness: awake, alert , and oriented  Airway & Oxygen Therapy: Patient Spontanous Breathing  Post-op Assessment: Report given to RN  Post vital signs: Reviewed and stable  Last Vitals:  Vitals Value Taken Time  BP 134/84 11/25/23 1037  Temp    Pulse 74 11/25/23 1037  Resp 17 11/25/23 1037  SpO2 97 % 11/25/23 1037    Last Pain:  Vitals:   11/25/23 0750  TempSrc:   PainSc: 0-No pain         Complications: No notable events documented.

## 2023-11-25 NOTE — Progress Notes (Signed)
 Staff assist requested by Skyler RN, this RN at bedside to assist with PPH. Roby MD called at 1828 and notified of patients bleeding/clots. Verbal order for 0.2mg  Methergine IM and 1000mg  TXA IVP. Cowen CNM was then requested to come to assess the patient at the bedside and she was at bedside at 1832.

## 2023-11-25 NOTE — Anesthesia Procedure Notes (Signed)
 Spinal  Patient location during procedure: OR Start time: 11/25/2023 8:40 AM End time: 11/25/2023 8:46 AM Reason for block: surgical anesthesia Staffing Performed: resident/CRNA  Anesthesiologist: Dorothey Gate, MD Resident/CRNA: Manya Sells, CRNA Performed by: Manya Sells, CRNA Authorized by: Dorothey Gate, MD   Preanesthetic Checklist Completed: patient identified, IV checked, site marked, risks and benefits discussed, surgical consent, monitors and equipment checked, pre-op evaluation and timeout performed Spinal Block Patient position: sitting Prep: ChloraPrep Patient monitoring: heart rate, continuous pulse ox, blood pressure and cardiac monitor Approach: midline Location: L3-4 Injection technique: single-shot Needle Needle type: Whitacre and Introducer  Needle gauge: 24 G Needle length: 9 cm Assessment Sensory level: T4 Events: CSF return Additional Notes Sterile aseptic technique used throughout the procedure.  Negative paresthesia. Negative blood return. Positive free-flowing CSF. Expiration date of kit checked and confirmed. Patient tolerated procedure well, without complications.

## 2023-11-25 NOTE — H&P (Signed)
 Obstetric Preoperative History and Physical  Judy Conner is a 38 y.o. [phone] with IUP at [redacted]w[redacted]d presenting two hours past her arrival time for scheduled cesarean section.  Reports good fetal movement, no bleeding, no contractions, no leaking of fluid.  No acute preoperative concerns.    Cesarean Section Indication: prior x 3, repeat  Prenatal Course Source of Care: AOB  with onset of care at 12 weeks Pregnancy complications or risks: Patient Active Problem List   Diagnosis Date Noted   Previous cesarean delivery affecting pregnancy, antepartum 11/25/2023   [redacted] weeks gestation of pregnancy 10/27/2023   Anemia during pregnancy in third trimester 09/28/2023   Supervision of high risk pregnancy, antepartum 04/27/2023   Antepartum multigravida of advanced maternal age 32/02/2023   Obesity affecting pregnancy in third trimester 04/26/2023   History of cesarean delivery 04/26/2023   Vitamin D  deficiency 06/30/2020   History of umbilical hernia 12/12/2019   Gastroesophageal reflux disease 12/11/2019   She plans to breastfeed She desires post-placental ParaGard IUD for postpartum contraception.   Prenatal labs and studies: ABO, Rh: O POS (05/08 4696) Antibody: NEG (05/08 0822) Rubella: 27.40 (11/05 1528) RPR: Non Reactive (03/11 0912)  HBsAg: Negative (11/05 1528)  HIV: Non Reactive (03/11 0912)  EXB:MWUXLKGM/-- (04/17 0945) 1 hr Glucola  132 Genetic screening normal Anatomy US  normal  Past Medical History:  Diagnosis Date   Anemia    hx low iron    Chronic kidney disease    hx uti none currently   Dysmenorrhea, unspecified 12/12/2019   GERD (gastroesophageal reflux disease)    Headache(784.0)    History of umbilical hernia 12/12/2019   Low serum potassium    Sciatica    Urticaria     Past Surgical History:  Procedure Laterality Date   CESAREAN SECTION  [phone]   x 3   FINGER FRACTURE SURGERY Left    pinky   WISDOM TOOTH EXTRACTION     four    OB History   Gravida Para Term Preterm AB Living  6 3 3  2 3   SAB IAB Ectopic Multiple Live Births   1 1  3     # Outcome Date GA Lbr Len/2nd Weight Sex Type Anes PTL Lv  6 Current           5 IAB 2022     TAB     4 Term 09/18/08    M CS-Unspec  N LIV     Birth Comments: c/s d/t prev c/s  3 Ectopic 2010          2 Term 06/16/07 [redacted]w[redacted]d   M   N LIV     Birth Comments: c/s d/t prev c/s  1 Term 05/04/05    F CS-Unspec  N LIV     Birth Comments: doesn't remember what complications were    Social History   Socioeconomic History   Marital status: Significant Other    Spouse name: Not on file   Number of children: 3   Years of education: 14   Highest education level: Associate degree: occupational, Scientist, product/process development, or vocational program  Occupational History   Occupation: CNA  Tobacco Use   Smoking status: Some Days    Current packs/day: 0.25    Average packs/day: 0.3 packs/day for 4.0 years (1.0 ttl pk-yrs)    Types: Cigarettes    Passive exposure: Past   Smokeless tobacco: Never   Tobacco comments:    Occ   Vaping Use   Vaping status: Never  Used  Substance and Sexual Activity   Alcohol use: Not Currently    Comment: occasional   Drug use: No   Sexual activity: Not Currently    Partners: Male    Birth control/protection: None  Other Topics Concern   Not on file  Social History Narrative   Divorced- lives with 3 kids and single parent since 2019   Social Drivers of Health   Financial Resource Strain: High Risk (04/27/2023)   Overall Financial Resource Strain (CARDIA)    Difficulty of Paying Living Expenses: Very hard  Food Insecurity: No Food Insecurity (11/25/2023)   Hunger Vital Sign    Worried About Running Out of Food in the Last Year: Never true    Ran Out of Food in the Last Year: Never true  Transportation Needs: No Transportation Needs (11/25/2023)   PRAPARE - Administrator, Civil Service (Medical): No    Lack of Transportation (Non-Medical): No  Physical Activity:  Sufficiently Active (04/27/2023)   Exercise Vital Sign    Days of Exercise per Week: 3 days    Minutes of Exercise per Session: 60 min  Stress: No Stress Concern Present (04/27/2023)   [employer] of Occupational Health - Occupational Stress Questionnaire    Feeling of Stress : Not at all  Social Connections: Unknown (04/27/2023)   Social Connection and Isolation Panel [NHANES]    Frequency of Communication with Friends and Family: More than three times a week    Frequency of Social Gatherings with Friends and Family: Never    Attends Religious Services: Never    Database administrator or Organizations: No    Attends Engineer, structural: Never    Marital Status: Not on file    Family History  Problem Relation Age of Onset   Healthy Mother    Heart disease Father    Hypertension Father    Crohn's disease Father    Hypertension Sister    Healthy Sister    Healthy Brother    Breast cancer Maternal Grandmother    Diabetes Maternal Grandmother    Clotting disorder Paternal Uncle     Medications Prior to Admission  Medication Sig Dispense Refill Last Dose/Taking   docusate sodium  (COLACE) 100 MG capsule Take 1 capsule (100 mg total) by mouth daily. Take with daily iron pill to prevent constipation 90 capsule 0 Past Month   famotidine  (PEPCID ) 20 MG tablet Take 1 tablet (20 mg total) by mouth 2 (two) times daily. 60 tablet 0 11/24/2023   ferrous sulfate  325 (65 FE) MG EC tablet Take 1 tablet (325 mg total) by mouth daily. 90 tablet 0 Past Week   Blood Glucose Monitoring Suppl DEVI 1 each by Does not apply route 4 (four) times daily. May substitute to any manufacturer covered by patient's insurance. (Patient not taking: Reported on 11/21/2023) 1 each 0     Allergies  Allergen Reactions   Fruit & Vegetable Daily [Nutritional Supplements] Anaphylaxis    Fresh Fruits    Review of Systems: Pertinent items noted in HPI and remainder of comprehensive ROS otherwise  negative.  Physical Exam: Ht 5\' 5"  (1.651 m)   Wt 116.1 kg   LMP 02/25/2023 (Exact Date)   BMI 42.60 kg/m  FHR by Doppler: 130s bpm CONSTITUTIONAL: Well-developed, well-nourished female in no acute distress.  HENT:  Normocephalic, atraumatic, External right and left ear normal. Oropharynx is clear and moist EYES: Conjunctivae and EOM are normal. Pupils are equal, round, and reactive  to light. No scleral icterus.  NECK: Normal range of motion, supple, no masses SKIN: Skin is warm and dry. No rash noted. Not diaphoretic. No erythema. No pallor. NEUROLOGIC: Alert and oriented to person, place, and time. Normal reflexes, muscle tone coordination. No cranial nerve deficit noted. PSYCHIATRIC: Normal mood and affect. Normal behavior. Normal judgment and thought content. CARDIOVASCULAR: Normal heart rate noted, regular rhythm RESPIRATORY: Effort and breath sounds normal, no problems with respiration noted ABDOMEN: Soft, nontender, nondistended, gravid. Well-healed Pfannenstiel incision. PELVIC: Deferred MUSCULOSKELETAL: Normal range of motion. No edema and no tenderness. 2+ distal pulses.  Pertinent Labs/Studies:   Results for orders placed or performed during the hospital encounter of 11/24/23 (from the past 72 hours)  CBC     Status: Abnormal   Collection Time: 11/24/23  8:22 AM  Result Value Ref Range   WBC 11.1 (H) 4.0 - 10.5 K/uL   RBC 4.02 3.87 - 5.11 MIL/uL   Hemoglobin 12.1 12.0 - 15.0 g/dL   HCT 40.9 81.1 - 91.4 %   MCV 92.3 80.0 - 100.0 fL   MCH 30.1 26.0 - 34.0 pg   MCHC 32.6 30.0 - 36.0 g/dL   RDW 78.2 (H) 95.6 - 21.3 %   Platelets 248 150 - 400 K/uL   nRBC 0.0 0.0 - 0.2 %    Comment: Performed at [facility], [address] [place], [place] [address]  Type and screen     Status: None   Collection Time: 11/24/23  8:22 AM  Result Value Ref Range   ABO/RH(D) O POS    Antibody Screen NEG    Sample Expiration 11/27/2023,2359    Extend sample reason       PREGNANT WITHIN 3 MONTHS, UNABLE TO EXTEND Performed at [facility], [address], [place], [place] [address]     Assessment and Plan: Judy Conner is a 38 y.o. [id number] at [redacted]w[redacted]d being admitted for scheduled cesarean section. The risks of surgery were discussed with the patient including but were not limited to: bleeding which may require transfusion or reoperation; infection which may require antibiotics; injury to bowel, bladder, ureters or other surrounding organs; injury to the fetus; need for additional procedures including hysterectomy in the event of a life-threatening hemorrhage; formation of adhesions; placental abnormalities wth subsequent pregnancies; incisional problems; thromboembolic phenomenon and other postoperative/anesthesia complications. The patient concurred with the proposed plan, giving informed written consent for the procedure. Patient has been NPO since last night she will remain NPO for procedure. Anesthesia and OR aware. Preoperative prophylactic antibiotics and SCDs ordered on call to the OR. To OR when ready.    Sofia Dunn, DO [facility]

## 2023-11-26 ENCOUNTER — Inpatient Hospital Stay: Admitting: Registered Nurse

## 2023-11-26 ENCOUNTER — Encounter
Admission: RE | Disposition: A | Discharge status: Home / Self Care | Payer: Self-pay | Source: Home / Self Care | Attending: Advanced Practice Midwife

## 2023-11-26 DIAGNOSIS — Z30432 Encounter for removal of intrauterine contraceptive device: Secondary | ICD-10-CM | POA: Diagnosis not present

## 2023-11-26 HISTORY — PX: DILATION AND EVACUATION: SHX1459

## 2023-11-26 LAB — CBC
HCT: 27.6 % — ABNORMAL LOW (ref 36.0–46.0)
HCT: 22 % — ABNORMAL LOW (ref 36.0–46.0)
Hemoglobin: 9 g/dL — ABNORMAL LOW (ref 12.0–15.0)
Hemoglobin: 7.6 g/dL — ABNORMAL LOW (ref 12.0–15.0)
MCH: 29.2 pg (ref 26.0–34.0)
MCH: 30.5 pg (ref 26.0–34.0)
MCHC: 32.6 g/dL (ref 30.0–36.0)
MCHC: 34.5 g/dL (ref 30.0–36.0)
MCV: 89.6 fL (ref 80.0–100.0)
MCV: 88.4 fL (ref 80.0–100.0)
Platelets: 161 10*3/uL (ref 150–400)
Platelets: 153 10*3/uL (ref 150–400)
RBC: 3.08 MIL/uL — ABNORMAL LOW (ref 3.87–5.11)
RBC: 2.49 MIL/uL — ABNORMAL LOW (ref 3.87–5.11)
RDW: 18.4 % — ABNORMAL HIGH (ref 11.5–15.5)
RDW: 18.4 % — ABNORMAL HIGH (ref 11.5–15.5)
WBC: 16.9 10*3/uL — ABNORMAL HIGH (ref 4.0–10.5)
WBC: 19.5 10*3/uL — ABNORMAL HIGH (ref 4.0–10.5)
nRBC: 0 % (ref 0.0–0.2)
nRBC: 0 % (ref 0.0–0.2)

## 2023-11-26 LAB — PREPARE RBC (CROSSMATCH)

## 2023-11-26 SURGERY — DILATION AND EVACUATION, UTERUS
Anesthesia: General

## 2023-11-26 MED ORDER — LACTATED RINGERS IV SOLN: INTRAVENOUS | Status: DC

## 2023-11-26 MED ORDER — SIMETHICONE 80 MG PO CHEW
80.0000 mg | CHEWABLE_TABLET | Freq: Four times a day (QID) | ORAL | Status: DC
  Administered 2023-11-26 – 2023-11-27 (×4): 80 mg via ORAL
  Filled 2023-11-26 (×4): qty 1

## 2023-11-26 MED ORDER — SUCCINYLCHOLINE CHLORIDE 200 MG/10ML IV SOSY
PREFILLED_SYRINGE | INTRAVENOUS | Status: AC
  Filled 2023-11-26: qty 10

## 2023-11-26 MED ORDER — MIDAZOLAM HCL 2 MG/2ML IJ SOLN
INTRAMUSCULAR | Status: DC | PRN
  Administered 2023-11-26: 2 mg via INTRAVENOUS

## 2023-11-26 MED ORDER — OXYCODONE HCL 5 MG PO TABS
ORAL_TABLET | ORAL | Status: AC
  Filled 2023-11-26: qty 1

## 2023-11-26 MED ORDER — SODIUM CHLORIDE 0.9% IV SOLUTION: Freq: Once | INTRAVENOUS | Status: DC

## 2023-11-26 MED ORDER — SUCCINYLCHOLINE CHLORIDE 200 MG/10ML IV SOSY
PREFILLED_SYRINGE | INTRAVENOUS | Status: DC | PRN
Start: 2023-11-26 — End: 2023-11-26
  Administered 2023-11-26: 140 mg via INTRAVENOUS

## 2023-11-26 MED ORDER — IBUPROFEN 600 MG PO TABS
600.0000 mg | ORAL_TABLET | Freq: Four times a day (QID) | ORAL | Status: DC
  Administered 2023-11-26 – 2023-11-27 (×5): 600 mg via ORAL
  Filled 2023-11-26 (×5): qty 1

## 2023-11-26 MED ORDER — DEXAMETHASONE SODIUM PHOSPHATE 10 MG/ML IJ SOLN
INTRAMUSCULAR | Status: DC | PRN
  Administered 2023-11-26: 10 mg via INTRAVENOUS

## 2023-11-26 MED ORDER — PROPOFOL 10 MG/ML IV BOLUS
INTRAVENOUS | Status: DC | PRN
  Administered 2023-11-26: 120 mg via INTRAVENOUS

## 2023-11-26 MED ORDER — OXYCODONE HCL 5 MG/5ML PO SOLN: 5.0000 mg | Freq: Once | ORAL | Status: AC | PRN

## 2023-11-26 MED ORDER — LIDOCAINE HCL (PF) 2 % IJ SOLN
INTRAMUSCULAR | Status: AC
  Filled 2023-11-26: qty 5

## 2023-11-26 MED ORDER — PHENYLEPHRINE 80 MCG/ML (10ML) SYRINGE FOR IV PUSH (FOR BLOOD PRESSURE SUPPORT)
PREFILLED_SYRINGE | INTRAVENOUS | Status: DC | PRN
  Administered 2023-11-26 (×2): 160 ug via INTRAVENOUS

## 2023-11-26 MED ORDER — PROPOFOL 10 MG/ML IV BOLUS
INTRAVENOUS | Status: AC
  Filled 2023-11-26: qty 20

## 2023-11-26 MED ORDER — FENTANYL CITRATE (PF) 100 MCG/2ML IJ SOLN
INTRAMUSCULAR | Status: AC
  Administered 2023-11-26: 100 ug
  Filled 2023-11-26: qty 2

## 2023-11-26 MED ORDER — ONDANSETRON HCL 4 MG/2ML IJ SOLN
INTRAMUSCULAR | Status: AC
  Filled 2023-11-26: qty 2

## 2023-11-26 MED ORDER — MIDAZOLAM HCL 2 MG/2ML IJ SOLN
INTRAMUSCULAR | Status: AC
  Filled 2023-11-26: qty 2

## 2023-11-26 MED ORDER — FENTANYL CITRATE (PF) 100 MCG/2ML IJ SOLN: 100.0000 ug | Freq: Once | INTRAMUSCULAR | Status: AC

## 2023-11-26 MED ORDER — CALCIUM CHLORIDE 10 % IV SOLN
INTRAVENOUS | Status: AC
  Filled 2023-11-26: qty 10

## 2023-11-26 MED ORDER — LIDOCAINE HCL (CARDIAC) PF 100 MG/5ML IV SOSY
PREFILLED_SYRINGE | INTRAVENOUS | Status: DC | PRN
  Administered 2023-11-26: 100 mg via INTRAVENOUS

## 2023-11-26 MED ORDER — ZOLPIDEM TARTRATE 5 MG PO TABS: 5.0000 mg | ORAL_TABLET | Freq: Every evening | ORAL | Status: DC | PRN

## 2023-11-26 MED ORDER — CALCIUM CHLORIDE 10 % IV SOLN
INTRAVENOUS | Status: DC | PRN
Start: 2023-11-26 — End: 2023-11-26
  Administered 2023-11-26: 1 g via INTRAVENOUS

## 2023-11-26 MED ORDER — OXYTOCIN-SODIUM CHLORIDE 30-0.9 UT/500ML-% IV SOLN
2.5000 [IU]/h | INTRAVENOUS | Status: AC
  Administered 2023-11-26 (×2): 2.5 [IU]/h via INTRAVENOUS
  Filled 2023-11-26 (×2): qty 500

## 2023-11-26 MED ORDER — OXYTOCIN 10 UNIT/ML IJ SOLN
INTRAMUSCULAR | Status: AC
  Filled 2023-11-26: qty 3

## 2023-11-26 MED ORDER — FENTANYL CITRATE (PF) 100 MCG/2ML IJ SOLN
25.0000 ug | INTRAMUSCULAR | Status: DC | PRN
  Administered 2023-11-26 (×2): 50 ug via INTRAVENOUS

## 2023-11-26 MED ORDER — LACTATED RINGERS IV SOLN: INTRAVENOUS | Status: AC

## 2023-11-26 MED ORDER — OXYCODONE HCL 5 MG PO TABS
5.0000 mg | ORAL_TABLET | Freq: Once | ORAL | Status: AC | PRN
  Administered 2023-11-26: 5 mg via ORAL

## 2023-11-26 MED ORDER — ONDANSETRON HCL 4 MG/2ML IJ SOLN: 4.0000 mg | Freq: Once | INTRAMUSCULAR | Status: DC | PRN

## 2023-11-26 MED ORDER — ACETAMINOPHEN 10 MG/ML IV SOLN: 1000.0000 mg | Freq: Once | INTRAVENOUS | Status: DC | PRN

## 2023-11-26 MED ORDER — FENTANYL CITRATE (PF) 100 MCG/2ML IJ SOLN
INTRAMUSCULAR | Status: AC
  Filled 2023-11-26: qty 2

## 2023-11-26 MED ORDER — SENNOSIDES-DOCUSATE SODIUM 8.6-50 MG PO TABS
2.0000 | ORAL_TABLET | ORAL | Status: DC
  Administered 2023-11-26 – 2023-11-27 (×2): 2 via ORAL
  Filled 2023-11-26 (×2): qty 2

## 2023-11-26 MED ORDER — DEXAMETHASONE SODIUM PHOSPHATE 10 MG/ML IJ SOLN
INTRAMUSCULAR | Status: AC
  Filled 2023-11-26: qty 1

## 2023-11-26 MED ORDER — FENTANYL CITRATE (PF) 100 MCG/2ML IJ SOLN
INTRAMUSCULAR | Status: DC | PRN
  Administered 2023-11-26: 100 ug via INTRAVENOUS

## 2023-11-26 MED ORDER — ACETAMINOPHEN 500 MG PO TABS
ORAL_TABLET | ORAL | Status: AC
  Filled 2023-11-26: qty 1

## 2023-11-26 MED ORDER — OXYTOCIN-SODIUM CHLORIDE 30-0.9 UT/500ML-% IV SOLN
INTRAVENOUS | Status: DC | PRN
  Administered 2023-11-26: 500 mL via INTRAVENOUS

## 2023-11-26 MED ORDER — PRENATAL MULTIVITAMIN CH
1.0000 | ORAL_TABLET | Freq: Every day | ORAL | Status: DC
  Administered 2023-11-26: 1 via ORAL
  Filled 2023-11-26: qty 1

## 2023-11-26 MED ORDER — ONDANSETRON HCL 4 MG/2ML IJ SOLN
INTRAMUSCULAR | Status: DC | PRN
  Administered 2023-11-26: 4 mg via INTRAVENOUS

## 2023-11-26 SURGICAL SUPPLY — 25 items
CUP MEDICINE 2OZ PLAST GRAD ST (MISCELLANEOUS) ×1 IMPLANT
DRSG TELFA 3X8 NADH STRL (GAUZE/BANDAGES/DRESSINGS) ×1 IMPLANT
FILTER UTR ASPR SPEC (MISCELLANEOUS) ×1 IMPLANT
GLOVE PI ORTHO PRO STRL 7.5 (GLOVE) ×1 IMPLANT
GOWN STRL REUS W/ TWL LRG LVL3 (GOWN DISPOSABLE) ×2 IMPLANT
GRADUATE 1200CC STRL 31836 (MISCELLANEOUS) ×1 IMPLANT
HANDLE YANKAUER SUCT OPEN TIP (MISCELLANEOUS) IMPLANT
KIT BERKELEY 1ST TRIMESTER 3/8 (MISCELLANEOUS) ×1 IMPLANT
KIT TURNOVER KIT A (KITS) ×1 IMPLANT
MANIFOLD NEPTUNE II (INSTRUMENTS) IMPLANT
NDL HYPO 25X1 1.5 SAFETY (NEEDLE) ×1 IMPLANT
NEEDLE HYPO 25X1 1.5 SAFETY (NEEDLE) IMPLANT
PACK DNC HYST (MISCELLANEOUS) ×1 IMPLANT
PAD OB MATERNITY 11 LF (PERSONAL CARE ITEMS) ×1 IMPLANT
PAD PREP OB/GYN DISP 24X41 (PERSONAL CARE ITEMS) ×1 IMPLANT
SCRUB CHG 4% DYNA-HEX 4OZ (MISCELLANEOUS) ×1 IMPLANT
SET BERKELEY SUCTION TUBING (SUCTIONS) ×1 IMPLANT
SET CYSTO W/LG BORE CLAMP LF (SET/KITS/TRAYS/PACK) IMPLANT
SYR 50ML LL SCALE MARK (SYRINGE) IMPLANT
TRAP FLUID SMOKE EVACUATOR (MISCELLANEOUS) IMPLANT
VACURETTE 10 RIGID CVD (CANNULA) IMPLANT
VACURETTE 12 RIGID CVD (CANNULA) IMPLANT
VACURETTE 7MM F TIP STRL (CANNULA) IMPLANT
VACURETTE 8 RIGID CVD (CANNULA) IMPLANT
WATER STERILE IRR 500ML POUR (IV SOLUTION) ×1 IMPLANT

## 2023-11-26 NOTE — Anesthesia Procedure Notes (Signed)
 Procedure Name: Intubation Date/Time: 11/26/2023 6:00 AM  Performed by: Racheal Buddle, CRNAPre-anesthesia Checklist: Patient identified, Emergency Drugs available, Suction available and Patient being monitored Patient Re-evaluated:Patient Re-evaluated prior to induction Oxygen Delivery Method: Circle system utilized Preoxygenation: Pre-oxygenation with 100% oxygen Induction Type: IV induction, Cricoid Pressure applied and Rapid sequence Laryngoscope Size: Mac and 3 Grade View: Grade II Tube type: Oral Tube size: 6.5 mm Number of attempts: 1 Airway Equipment and Method: Stylet Placement Confirmation: ETT inserted through vocal cords under direct vision, positive ETCO2 and breath sounds checked- equal and bilateral Secured at: 18 cm Tube secured with: Tape Dental Injury: Teeth and Oropharynx as per pre-operative assessment

## 2023-11-26 NOTE — Progress Notes (Addendum)
 Assumed care 2320. Patient pain 9/10, difficult to reposition to bedpan. Patient medicated and voided in bedpan (~180cc) but amount was minimal and mixed with blood and a golfball-sized clot (30cc). M Cowen notified. Foley placed with minimal output. Maintenance fluids ordered. Cowen CNM visualized JADA output to be WNL. No further vaginal bleeding noted. 2nd bag of blood initiated. Patient comfortable after interventions.   0410: Patient in 10/10 sudden pain. Bleeding noted since 1hr ago, additional 130cc. Cowen CNM notified. Vitals assessed. Has lost additional 350cc QBL since assuming care.

## 2023-11-26 NOTE — Anesthesia Post-op Follow-up Note (Signed)
  Anesthesia Pain Follow-up Note  Patient: Judy Conner  Day #: 1  Date of Follow-up: 11/26/2023 Time: 10:52 AM  Last Vitals:  Vitals:   11/26/23 0842 11/26/23 0855  BP: (!) 116/55   Pulse: 90   Resp: 18   Temp:  36.7 C  SpO2:      Level of Consciousness: alert  Pain: moderate, abdominal   Side Effects:Pruritis  Catheter Site Exam:clean, dry  Anti-Coag Meds (From admission, onward)    Start     Dose/Rate Route Frequency Ordered Stop   11/26/23 0800  enoxaparin (LOVENOX) injection 40 mg        40 mg Subcutaneous Every 24 hours 11/25/23 1328          Plan: D/C from anesthesia care at surgeon's request  Vanice Genre

## 2023-11-26 NOTE — Progress Notes (Signed)
 Notified by RN that some leaking from Judy Conner on underpad.   Patient reports pain has improved since yesterday. Still having some post op pain- generally controlled with PO medication. She denies s/s of blood loss anemia.   BP (!) 116/55 (BP Location: Right Arm)   Pulse 90   Temp 98.1 F (36.7 C) (Oral)   Resp 18   Ht 5\' 5"  (1.651 m)   Wt 116.1 kg   LMP 02/25/2023 (Exact Date)   SpO2 95%   Breastfeeding Unknown   BMI 42.60 kg/m   Judy Conner output: 250 ml in canister Urine output wnl Underpad: 70 ml   Latest Reference Range & Units 11/26/23 08:57  WBC 4.0 - 10.5 K/uL 16.9 (H)  RBC 3.87 - 5.11 MIL/uL 3.08 (L)  Hemoglobin 12.0 - 15.0 g/dL 9.0 (L)  HCT 16.1 - 09.6 % 27.6 (L)  MCV 80.0 - 100.0 fL 89.6  MCH 26.0 - 34.0 pg 29.2  MCHC 30.0 - 36.0 g/dL 04.5  RDW 40.9 - 81.1 % 18.4 (H)  Platelets 150 - 400 K/uL 161  nRBC 0.0 - 0.2 % 0.0  (H): Data is abnormally high (L): Data is abnormally low  Dr Luster Salters notified of blood loss on pad.  Consider transfuse additional unit per Dr Luster Salters.  Angelita Kendall, CNM [facility] 11/26/2023 10:58 AM

## 2023-11-26 NOTE — Progress Notes (Signed)
 Called to room by RN to assess patient who was now having pain 10/10 to both her hips and lower abdomen. Patient was crying from pain, unable to keep still. Gave IV fentanyl  which slightly improved pain per Moira Andrews but she still stated her pain was 10/10. She had an additional of blood loss on her pads since 2330, bringing total measured EBL up to this point to 2340g. Additionally the Jada tubing now had more blood further up the tubing towards the wall suction. On light palpation patient's abdomen was tender and fundus displaced to the right and very elevated. VSS. She has now received two units of blood, broad spectrum abx, has had maintenance fluids going, and urine output roughly 66ml/hr. I called Dr. Luster Salters to come assess patient for possible D&C and evaluation of other potential complications. Dr. Luster Salters stated he would likely take patient to the OR and would come assess her.  Lou Rounds CNM 11/26/23

## 2023-11-26 NOTE — Op Note (Addendum)
   OPERATIVE NOTE 11/26/2023 6:45 AM  PRE-OPERATIVE DIAGNOSIS:  1) Vaginal bleeding after CD 2) Uterine fundus rising on exam 3) IUD in place 4) Worsening abdominal pain 5) Failed JADA  POST-OPERATIVE DIAGNOSIS:  1) Retained placental fragments and large clots  OPERATION:  D&E/C  SURGEON(S): Surgeons and Role:    Zenobia Hila, MD - Primary   ANESTHESIA: General  ESTIMATED BLOOD LOSS: 500 mL  OPERATIVE FINDINGS: Uterine cavity filled with large clots.  Curettage and suction curette revealed apparent retained placental tissue.  IUD  SPECIMEN:  ID Type Source Tests Collected by Time Destination  1 : uterine contents Products of Conception PATH POC SURGICAL PATHOLOGY Zenobia Hila, MD 11/26/2023 [phone]     COMPLICATIONS: None  DRAINS: None  DISPOSITION: Stable to recovery room  DESCRIPTION OF PROCEDURE:      The patient was prepped and draped in the dorsal lithotomy position and placed under general anesthesia. Her bladder was emptied. The cervix was grasped with a ring forceps.  The JADA was removed after deflating the balloon.  Immediately upon removal large clots came out of the uterus.  The IUD strings were not visible so a ring forceps was used to explore the uterus and the IUD was found grasped and removed.  It was noted to be intact. A number 12 suction curette was used to perform an curettage.  This was productive of clots and tissue.  A gentle curette was performed in all quadrants using the banjo curette.  Several passes were made and apparent placental tissue was obtained on multiple passes.  The suction curette was again placed and a curettage was again performed under suction.  At this time no further tissue was noted.  Another systematic curettage was performed in all quadrants and no additional tissue was noted. Pitocin was run in the IV.  A new JADA was placed within the endometrial cavity and the balloon was filled with 120 cc of sterile saline suction was  applied and minimal blood was noted.  (Suction will again be applied in the recovery room and postpartum) The ring forceps was removed from the cervix and hemostasis was noted. The weighted speculum was removed and the patient went to recovery room in stable condition.  No follow-up provider specified.  Delice Felt, M.D. 11/26/2023 6:45 AM

## 2023-11-26 NOTE — Anesthesia Preprocedure Evaluation (Signed)
 Anesthesia Evaluation  Patient identified by MRN, date of birth, ID band Patient awake  General Assessment Comment:S/p c section yesterday morning with continued PPH. Based on nursing notes and QBL, about ~2500 total EBL from surgery and afterwards so far. S/p 2 units PRBC infusion. Hgb stable as of 8pm last night, no new labs since then.  Reviewed: Allergy & Precautions, NPO status , Patient's Chart, lab work & pertinent test results  History of Anesthesia Complications Negative for: history of anesthetic complications  Airway Mallampati: II  TM Distance: >3 FB Neck ROM: Full    Dental no notable dental hx. (+) Teeth Intact   Pulmonary neg sleep apnea, neg COPD, Current Smoker and Patient abstained from smoking.   Pulmonary exam normal breath sounds clear to auscultation       Cardiovascular Exercise Tolerance: Good METS(-) hypertension(-) CAD and (-) Past MI negative cardio ROS (-) dysrhythmias  Rhythm:Regular Rate:Normal - Systolic murmurs    Neuro/Psych  Headaches  negative psych ROS   GI/Hepatic ,GERD  ,,(+)     (-) substance abuse    Endo/Other  neg diabetes  Class 3 obesity  Renal/GU negative Renal ROS     Musculoskeletal   Abdominal  (+) + obese Abdomen: tender.   Peds  Hematology   Anesthesia Other Findings Past Medical History: No date: Anemia     Comment:  hx low iron  No date: Chronic kidney disease     Comment:  hx uti none currently 12/12/2019: Dysmenorrhea, unspecified No date: GERD (gastroesophageal reflux disease) No date: Headache(784.0) 12/12/2019: History of umbilical hernia No date: Low serum potassium No date: Sciatica No date: Urticaria  Reproductive/Obstetrics                             Anesthesia Physical Anesthesia Plan  ASA: 3  Anesthesia Plan: General   Post-op Pain Management: Ofirmev  IV (intra-op)*   Induction: Intravenous  PONV Risk Score and  Plan: 3 and Ondansetron , Dexamethasone , Midazolam  and Treatment may vary due to age or medical condition  Airway Management Planned: Oral ETT and Video Laryngoscope Planned  Additional Equipment: None  Intra-op Plan:   Post-operative Plan: Extubation in OR  Informed Consent: I have reviewed the patients History and Physical, chart, labs and discussed the procedure including the risks, benefits and alternatives for the proposed anesthesia with the patient or authorized representative who has indicated his/her understanding and acceptance.     Dental advisory given  Plan Discussed with: CRNA and Surgeon  Anesthesia Plan Comments: (Discussed risks of anesthesia with patient, including PONV, sore throat, lip/dental/eye damage. Rare risks discussed as well, such as cardiorespiratory and neurological sequelae, and allergic reactions. Patient OK with blood transfusions. Discussed the role of CRNA in patient's perioperative care. Patient understands.)       Anesthesia Quick Evaluation

## 2023-11-26 NOTE — Progress Notes (Addendum)
 Suction to Jada discontinued at 5:30 PM. Jada removed intact at 6:15 PM. Patient tolerated well. Total blood in canister 400 ml. Scant on underpad. Denies s/s of blood loss anemia. Foley continues to have good output, clear light yellow urine.   BP 137/71 (BP Location: Right Arm)   Pulse 99   Temp 98.2 F (36.8 C) (Oral)   Resp 16   Ht 5\' 5"  (1.651 m)   Wt 116.1 kg   LMP 02/25/2023 (Exact Date)   SpO2 95%   Breastfeeding Unknown   BMI 42.60 kg/m   Fundus at U-1, firm  Intake/Output Summary (Last 24 hours) at 11/26/2023 1826 Last data filed at 11/26/2023 1818 Gross per 24 hour  Intake 3172.03 ml  Output 3540 ml  Net -367.97 ml    Continue postop care CBC at 7:30 PM Transfer back to mother baby    Angelita Kendall, CNM [facility] 11/26/2023 6:26 PM

## 2023-11-26 NOTE — Transfer of Care (Signed)
 Immediate Anesthesia Transfer of Care Note  Patient: Judy Conner  Procedure(s) Performed: Procedure(s): DILATION AND EVACUATION, UTERUS, removal of IUD, removal of JADA, insertion of JADA (N/A)  Patient Location: PACU  Anesthesia Type:General  Level of Consciousness: sedated  Airway & Oxygen Therapy: Patient Spontanous Breathing and Patient connected to face mask oxygen  Post-op Assessment: Report given to RN and Post -op Vital signs reviewed and stable  Post vital signs: Reviewed and stable  Last Vitals:  Vitals:   11/26/23 0658 11/26/23 0700  BP: 119/89 132/68  Pulse: 89 91  Resp: (!) 23 (!) 21  Temp: 36.5 C 36.5 C  SpO2: 99% 99%    Complications: No apparent anesthesia complications

## 2023-11-26 NOTE — Anesthesia Postprocedure Evaluation (Signed)
 Anesthesia Post Note  Patient: Judy Conner  Procedure(s) Performed: CESAREAN DELIVERY  Patient location during evaluation: Mother Baby Anesthesia Type: Spinal Level of consciousness: oriented and awake and alert Pain management: pain level controlled Vital Signs Assessment: post-procedure vital signs reviewed and stable Respiratory status: spontaneous breathing and respiratory function stable Cardiovascular status: blood pressure returned to baseline and stable Postop Assessment: no headache, no backache, no apparent nausea or vomiting and able to ambulate Anesthetic complications: no   No notable events documented.   Last Vitals:  Vitals:   11/26/23 0842 11/26/23 0855  BP: (!) 116/55   Pulse: 90   Resp: 18   Temp:  36.7 C  SpO2:      Last Pain:  Vitals:   11/26/23 0855  TempSrc: Oral  PainSc: 3                  Vanice Genre

## 2023-11-27 LAB — TYPE AND SCREEN
ABO/RH(D): O POS
Antibody Screen: NEGATIVE
Extend sample reason: UNDETERMINED
Unit division: 0
Unit division: 0
Unit division: 0

## 2023-11-27 LAB — BPAM RBC
Blood Product Expiration Date: 202506072359
Blood Product Expiration Date: 202506112359
ISSUE DATE / TIME: 202505100605
ISSUE DATE / TIME: 202505092145
ISSUE DATE / TIME: 202505100031
ISSUE DATE / TIME: 202506072359
Unit Type and Rh: 5100
Unit Type and Rh: 202506072359
Unit Type and Rh: 202506072359
Unit Type and Rh: 5100
Unit Type and Rh: 5100

## 2023-11-27 MED ORDER — SENNOSIDES-DOCUSATE SODIUM 8.6-50 MG PO TABS: 2.0000 | ORAL_TABLET | ORAL | Status: AC

## 2023-11-27 MED ORDER — ALBUTEROL SULFATE (2.5 MG/3ML) 0.083% IN NEBU: 2.5000 mg | INHALATION_SOLUTION | Freq: Once | RESPIRATORY_TRACT | Status: DC | PRN

## 2023-11-27 MED ORDER — ACETAMINOPHEN 500 MG PO TABS: 1000.0000 mg | ORAL_TABLET | Freq: Four times a day (QID) | ORAL | Status: AC | PRN

## 2023-11-27 MED ORDER — IRON SUCROSE 300 MG IVPB - SIMPLE MED
300.0000 mg | Freq: Once | Status: AC
  Administered 2023-11-27: 300 mg via INTRAVENOUS
  Filled 2023-11-27: qty 300

## 2023-11-27 MED ORDER — BUPIVACAINE ON-Q PAIN PUMP (FOR ORDER SET NO CHG): 1.0000 | INJECTION | Status: DC

## 2023-11-27 MED ORDER — METHYLPREDNISOLONE SODIUM SUCC 125 MG IJ SOLR
125.0000 mg | Freq: Once | INTRAMUSCULAR | Status: DC | PRN
  Filled 2023-11-27: qty 2

## 2023-11-27 MED ORDER — SODIUM CHLORIDE 0.9 % IV BOLUS: 500.0000 mL | Freq: Once | INTRAVENOUS | Status: DC | PRN

## 2023-11-27 MED ORDER — SIMETHICONE 80 MG PO CHEW: 80.0000 mg | CHEWABLE_TABLET | Freq: Four times a day (QID) | ORAL | Status: AC

## 2023-11-27 MED ORDER — SODIUM CHLORIDE 0.9 % IV SOLN: INTRAVENOUS | Status: DC | PRN

## 2023-11-27 MED ORDER — EPINEPHRINE 0.3 MG/0.3ML IJ SOAJ
0.3000 mg | Freq: Once | INTRAMUSCULAR | Status: DC | PRN
  Filled 2023-11-27: qty 0.3

## 2023-11-27 MED ORDER — IBUPROFEN 600 MG PO TABS: 600.0000 mg | ORAL_TABLET | Freq: Four times a day (QID) | ORAL | Status: AC | PRN

## 2023-11-27 MED ORDER — DIBUCAINE (PERIANAL) 1 % EX OINT: 1.0000 | TOPICAL_OINTMENT | CUTANEOUS | Status: AC | PRN

## 2023-11-27 MED ORDER — IRON SUCROSE 500 MG IVPB - SIMPLE MED
500.0000 mg | Freq: Once | INTRAVENOUS | Status: DC
  Filled 2023-11-27: qty 275

## 2023-11-27 MED ORDER — OXYCODONE HCL 5 MG PO TABS
5.0000 mg | ORAL_TABLET | ORAL | 0 refills | Status: AC | PRN
Start: 2023-11-27 — End: ?

## 2023-11-27 MED ORDER — WITCH HAZEL-GLYCERIN EX PADS: 1.0000 | MEDICATED_PAD | CUTANEOUS | Status: AC | PRN

## 2023-11-27 NOTE — Discharge Instructions (Signed)

## 2023-11-27 NOTE — Lactation Note (Signed)
 This note was copied from a baby's chart. Lactation Consultation Note  Patient Name: Judy Conner [id number] Date: 11/27/2023 Age:38 hours Reason for consult: RN request;Difficult latch;Engorgement;Maternal discharge (Due to some breastfeeding attempts initiated, LC assistance requested to discuss supression of lactogensis and engorgement management/prevention.)   Maternal Data  MOB reports formula feeding to exclusion at this time and is preparing for discharge from unit. With some breastfeeding attempts having taken place during inpatient stay, LC education provided regarding suppression of lactogenesis and referral to outpatient clinic should further alterations to feeding plan or changes in breasts should occur.       Interventions Interventions: Ice;Education (MOB currently denies any pain or engorgement at this time. LC provided discharge information regarding engorgement, signs of mastitis, and outpatient clinic referral options should any symptoms or changes in feeding plan arise.)  Discharge Discharge Education: Engorgement and breast care;Outpatient recommendation (Discussed ice and NSAIDS for pain relief/swelling, with any sudden or worsening changes to breasts reported to PCP)  Consult Status Consult Status: Complete (mother declined follow up)    Bronson Canny 11/27/2023, 3:46 PM

## 2023-11-27 NOTE — Progress Notes (Signed)
 Discharge instructions reviewed with patient.  Questions answered and follow up care reviewed. Printed copies given to patient for reference after discharge home.

## 2023-11-27 NOTE — Discharge Summary (Signed)
 Postpartum Discharge Summary  Date of Service updated 11/27/2023     Patient Name: Judy Conner DOB: 1986/02/11 MRN: [id number]  Date of admission: 11/25/2023 Delivery date:11/25/2023 Delivering provider: Sofia Dunn Date of discharge: 11/27/2023  Admitting diagnosis: Previous cesarean section [Z98.891] Previous cesarean delivery affecting pregnancy, antepartum [O34.219] Intrauterine pregnancy: [redacted]w[redacted]d     Secondary diagnosis:  Principal Problem:   Previous cesarean delivery affecting pregnancy, antepartum Active Problems:   Cesarean delivery delivered   Single live birth   Uterine adhesions   Encounter for insertion of ParaGard IUD  Additional problems: PPH requiring D and C     Discharge diagnosis: Term Pregnancy Delivered                                              Post partum procedures:blood transfusion and curettage IV iron  Augmentation: N/A Complications: Hemorrhage>1077mL  Hospital course: Sceduled C/S   38 y.o. yo [id number] at [redacted]w[redacted]d was admitted to the hospital 11/25/2023 for scheduled cesarean section with the following indication:Elective Repeat.Delivery details are as follows:  Membrane Rupture Time/Date: 9:25 AM,11/25/2023  Delivery Method:C-Section, Low Transverse Operative Delivery:N/A Details of operation can be found in separate operative note.  Patient had a postpartum course complicated by NA.  She is ambulating, tolerating a regular diet, passing flatus, and urinating well. She has tried to breastfeed but infant has not latch, will use formula. Pain present but managed with PRN medications.  Mood is good, currently her 38 y/o is at home not feeling well so she is appropriately concerned. Her mother is present for support.  Patient is discharged home in stable condition on  11/27/23        Newborn Data: Birth date:11/25/2023 Birth time:9:26 AM Gender:Female Living status:Living Apgars:8 ,9  Weight:2680 g    Magnesium Sulfate received: No BMZ received:  No Rhophylac:N/A MMR:N/A T-DaP:Given prenatally Flu: Yes RSV Vaccine received: No Transfusion:Yes Immunizations administered: Immunization History  Administered Date(s) Administered   Influenza, Seasonal, Injecte, Preservative Fre 06/23/2023   Influenza,inj,Quad PF,6+ Mos 06/24/2022   Influenza-Unspecified 05/19/2020   PFIZER(Purple Top)SARS-COV-2 Vaccination 05/19/2020, 06/20/2020   PPD Test 02/10/2015   Tdap 05/19/2021, 09/19/2023    Physical exam  Vitals:   11/26/23 2100 11/27/23 0006 11/27/23 0355 11/27/23 0900  BP: 112/65 115/61 116/66 129/86  Pulse: 97 (!) 101 95 97  Resp:  18 16 18   Temp: 98.3 F (36.8 C) 98.3 F (36.8 C) 97.8 F (36.6 C) 98 F (36.7 C)  TempSrc: Oral Oral Oral Oral  SpO2: 97% 100% 96%   Weight:      Height:       General: alert Lochia: appropriate Uterine Fundus: firm Incision: neg pressure device intact, On -Q pump intact  DVT Evaluation: No evidence of DVT seen on physical exam. Negative Homan's sign. +1BLE Labs: Lab Results  Component Value Date   WBC 19.5 (H) 11/26/2023   HGB 7.6 (L) 11/26/2023   HCT 22.0 (L) 11/26/2023   MCV 88.4 11/26/2023   PLT 153 11/26/2023      Latest Ref Rng & Units 04/26/2023    3:10 PM  CMP  Glucose 70 - 99 mg/dL 74   BUN 6 - 20 mg/dL 5   Creatinine 2.72 - 5.36 mg/dL 6.44   Sodium 034 - 742 mmol/L 138   Potassium 3.5 - 5.2 mmol/L 3.8   Chloride 96 -  106 mmol/L 103   CO2 20 - 29 mmol/L 20   Calcium 8.7 - 10.2 mg/dL 9.3   Total Protein 6.0 - 8.5 g/dL 6.4   Total Bilirubin 0.0 - 1.2 mg/dL <1.6   Alkaline Phos 44 - 121 IU/L 54   AST 0 - 40 IU/L 12   ALT 0 - 32 IU/L 11    Edinburgh Score:    11/25/2023    3:10 PM  Edinburgh Postnatal Depression Scale Screening Tool  I have been able to laugh and see the funny side of things. --      After visit meds:  Allergies as of 11/27/2023       Reactions   Fruit & Vegetable Daily [nutritional Supplements] Anaphylaxis   Fresh Fruits         Medication List     STOP taking these medications    Blood Glucose Monitoring Suppl Devi       TAKE these medications    acetaminophen  500 MG tablet Commonly known as: TYLENOL  Take 2 tablets (1,000 mg total) by mouth every 6 (six) hours as needed.   BUPIVACAINE  ON-Q PAIN PUMP (FOR ORDER SET NO CHG) 1 each by Other route continuous.   dibucaine 1 % Oint Commonly known as: NUPERCAINAL Place 1 Application rectally as needed for hemorrhoids.   docusate sodium  100 MG capsule Commonly known as: Colace Take 1 capsule (100 mg total) by mouth daily. Take with daily iron pill to prevent constipation   famotidine  20 MG tablet Commonly known as: PEPCID  Take 1 tablet (20 mg total) by mouth 2 (two) times daily.   ferrous sulfate  325 (65 FE) MG EC tablet Take 1 tablet (325 mg total) by mouth daily.   ibuprofen  600 MG tablet Commonly known as: ADVIL  Take 1 tablet (600 mg total) by mouth every 6 (six) hours as needed.   oxyCODONE  5 MG immediate release tablet Commonly known as: Oxy IR/ROXICODONE  Take 1-2 tablets (5-10 mg total) by mouth every 4 (four) hours as needed for moderate pain (pain score 4-6).   senna-docusate 8.6-50 MG tablet Commonly known as: Senokot-S Take 2 tablets by mouth daily.   simethicone  80 MG chewable tablet Commonly known as: MYLICON Chew 1 tablet (80 mg total) by mouth 4 (four) times daily.   witch hazel-glycerin pad Commonly known as: TUCKS Apply 1 Application topically as needed for hemorrhoids.               Discharge Care Instructions  (From admission, onward)           Start     Ordered   11/27/23 0000  Discharge wound care:       Comments: SHOWER DAILY Wash incision gently with soap and water.  Call office with any drainage, redness, or firmness of the incision.   11/27/23 1020             Discharge home in stable condition Infant Feeding: Bottle Infant Disposition:home with mother Discharge instruction: per After  Visit Summary and Postpartum booklet. Activity: Advance as tolerated. Pelvic rest for 6 weeks.  Diet: routine diet Anticipated Birth Control: PP IUD placed Postpartum Appointment:6 weeks Additional Postpartum F/U: Incision check 5 days Future Appointments: Future Appointments  Date Time Provider Department Center  12/07/2023  8:55 AM Sofia Dunn, MD [facility] None   Follow up Visit:  Follow-up Information     Sofia Dunn, MD Follow up in 5 day(s).   Specialties: Obstetrics, Gynecology Contact information: [address] [place]  [address] [phone]                     11/27/2023 Berkley Breech Fergie Sherbert, CNM

## 2023-11-28 ENCOUNTER — Encounter: Payer: Self-pay | Admitting: Obstetrics

## 2023-11-28 NOTE — Anesthesia Postprocedure Evaluation (Signed)
 Anesthesia Post Note  Patient: Judy Conner  Procedure(s) Performed: DILATION AND EVACUATION, UTERUS, removal of IUD, removal of JADA, insertion of JADA  Patient location during evaluation: PACU Anesthesia Type: General Level of consciousness: awake and alert Pain management: pain level controlled Vital Signs Assessment: post-procedure vital signs reviewed and stable Respiratory status: spontaneous breathing, nonlabored ventilation, respiratory function stable and patient connected to nasal cannula oxygen Cardiovascular status: blood pressure returned to baseline and stable Postop Assessment: no apparent nausea or vomiting Anesthetic complications: no   No notable events documented.   Last Vitals:  Vitals:   11/27/23 0355 11/27/23 0900  BP: 116/66 129/86  Pulse: 95 97  Resp: 16 18  Temp: 36.6 C 36.7 C  SpO2: 96%     Last Pain:  Vitals:   11/27/23 1330  TempSrc:   PainSc: 8                  Lattie Poli

## 2023-11-29 LAB — SURGICAL PATHOLOGY

## 2023-12-02 NOTE — Progress Notes (Signed)
   Postoperative Cesarean Follow-up Visit Judy Conner is a 38 y.o. [id number] s/p rLTCS at [redacted]w[redacted]d for history of previous cesarean section x 3, also s/p D&C on POD#0 for delayed PPH, here today for incision check, POD#12. Reports headaches for the past 2 days. Headache gone today. Was having some constipation, but alleviated with stool softeners.   Subjective: Pain is controlled without any medications. She denies fever, chills, nausea, and vomiting. Eating a regular diet without difficulty.  Is having regular bowel movements. Activity: normal activities of daily living. Bleeding is moderate. She denies issues with her incision.    Objective: BP (!) 140/84   Pulse 83   Ht 5\' 5"  (1.651 m)   Wt 237 lb (107.5 kg)   Breastfeeding No   BMI 39.44 kg/m  Body mass index is 39.44 kg/m.  General:  alert and no distress  Abdomen: soft, bowel sounds active, non-tender  Incision:   healing well, no drainage, no erythema, no hernia, no seroma, no swelling, no dehiscence, incision well approximated    Assessment/Plan: Judy Conner is a 38 y.o. [id number] s/p rLTCS at [redacted]w[redacted]d for repeat, POD#12, here today for incision check, healing well. Also POD#12 from [facility] for delayed PPH. No concerns with incision today, remaining steri-strips removed and replaced.  -Discussed at-home care, healing expectations, si/sx of infection.  -Call clinic if developing redness, discharge, or increasing pain. -Avoid vigorous scrubbing/washing of incision site; hygiene reviewed. -May trim back steri-strips if they peel; should fully remove after 1 week from today. -May resume driving and light walking. Still no heavy lifting >10-12 lbs and pelvic rest advised until cleared at 6wk postpartum visit.  -If stooling regularly x 1-2 weeks, can take stool softener every other day x 1 week, taper as tolerated.  Return in about 5 weeks (around 01/11/2024) for 6wk PPV & IUD insertion (ParaGard ).   Sofia Dunn, DO [facility]

## 2023-12-07 ENCOUNTER — Encounter: Payer: Self-pay | Admitting: Obstetrics

## 2023-12-07 ENCOUNTER — Ambulatory Visit (INDEPENDENT_AMBULATORY_CARE_PROVIDER_SITE_OTHER): Admitting: Obstetrics

## 2023-12-07 VITALS — BP 140/84 | HR 83 | Ht 65.0 in | Wt 237.0 lb

## 2023-12-07 DIAGNOSIS — Z4889 Encounter for other specified surgical aftercare: Secondary | ICD-10-CM

## 2023-12-28 ENCOUNTER — Ambulatory Visit (INDEPENDENT_AMBULATORY_CARE_PROVIDER_SITE_OTHER): Admitting: Obstetrics

## 2023-12-28 DIAGNOSIS — Z30430 Encounter for insertion of intrauterine contraceptive device: Secondary | ICD-10-CM | POA: Diagnosis not present

## 2023-12-28 DIAGNOSIS — D649 Anemia, unspecified: Secondary | ICD-10-CM

## 2023-12-28 MED ORDER — PARAGARD INTRAUTERINE COPPER IU IUD
1.0000 | INTRAUTERINE_SYSTEM | Freq: Once | INTRAUTERINE | Status: AC
  Administered 2023-12-28: 1 via INTRAUTERINE

## 2023-12-28 NOTE — Patient Instructions (Addendum)
 Link to postpartum exercises. Try to do these 3-5 times a week.  [email or web address].[email or web address]   Intrauterine Device (IUD) Insertion: What to Expect  An intrauterine device (IUD) is put in (inserted) your uterus to prevent pregnancy. It's a small, T-shaped device that has one or two nylon strings hanging down from it. The strings hang out of your cervix, which is the lowest part of your uterus. Tell a health care provider about: Any allergies you have. All medicines you take. These include vitamins, herbs, eye drops, and creams. Any surgeries you have had. Any medical problems you have. Whether you're pregnant or may be pregnant. What are the risks? Your health care provider will talk with you about risks. These may include: Infection. Bleeding. Allergic reactions to medicines. A cut to the uterus, also called perforation, or damage to other structures or organs. Accidental placement of the IUD either in the muscle layer of the uterus or outside the uterus. The IUD falling out of the uterus. This is more common if you recently had a baby. Higher risk of ectopic pregnancy. This is when an egg is fertilized outside your uterus. This is rare. Pelvic inflammatory disease (PID). This is an infection in the uterus and fallopian tubes. The IUD doesn't cause the infection. The infection is usually from a sexually transmitted infection (STI). If this happens, it is usually during the first 20 days after the IUD is put in. This is rare. What happens before? Ask about changing or stopping: Any medicines you take. Any vitamins, herbs, or supplements you take. Your provider may tell you to take pain medicines you can buy at the store before the procedure. You may have tests for: Pregnancy. You may have a pee (urine) or blood sample taken. STIs. Placing an IUD can make an infection worse. To check for cervical cancer. You may have a Pap test, which is  when cells from your cervix are removed for testing. What happens during an IUD insertion? A tool, called a speculum, will be placed in your vagina and widened so that your provider can see your cervix. A medicine to clean your cervix may be used to help lower your risk of infection. You may be given medicine to numb your cervix. This medicine is usually given by an injection into your cervix. A tool will be put into your uterus to check the length of your uterus. A thin tube that holds the IUD will be put into your vagina, through the opening of your cervix, and into your uterus. The IUD will be placed in your uterus. The tube that holds the IUD will be removed. The strings that are attached to the IUD will be trimmed so that they sit just outside your cervix. The speculum will be removed. These steps may vary. Ask what you can expect. What happens after? You may have: Bleeding. It can vary from light bleeding or spotting for a few days to period-like bleeding. This is normal. Cramps and pain in your belly. Dizziness or light-headedness. Pain in your lower back. Headaches and the feeling like you may throw up Follow these instructions at home: Do not have sex or put anything into your vagina for 24 hours after the IUD is placed. Before having sex, check to make sure that you can feel the IUD string or strings. You should be able to feel the end of the string below the opening of your cervix. If your IUD string is in place, you may continue with sex. If  you had a hormonal IUD put in more than 7 days after your most recent period started, you will need to use a backup method of birth control for 7 days after the IUD was placed. Hormones are chemicals that affect how the body works. Check that the IUD is still in place by feeling for the strings after every period, or check once a month. Use a condom every time you have sex to prevent STIs. An IUD won't protect you from STIs. Take your  medicines only as told. Contact a health care provider if: You have any of the following problems with your IUD string or strings: The string bothers or hurts you or your sexual partner. You can't feel the string. The string has gotten longer. The IUD comes out or you can feel the IUD in your vagina. You think you may be pregnant, or you miss your period. You think you may have an STI. You have bad-smelling discharge from your vagina. You have a fever and chills. You have pain during sex. Get help right away if: You have heavy bleeding, which means soaking more than 2 pads per hour for 2 hours in a row. You have sudden, really bad belly pain. This information is not intended to replace advice given to you by your health care provider. Make sure you discuss any questions you have with your health care provider. Document Revised: 03/14/2023 Document Reviewed: 03/14/2023 Elsevier Patient Education  2024 [employer].

## 2023-12-28 NOTE — Progress Notes (Signed)
 OBSTETRICS POSTPARTUM CLINIC PROGRESS NOTE  Subjective:     Judy Conner is a 38 y.o. [phone] female who presents for a postpartum visit. She is 5 week postpartum following a low transverse Cesarean section. I have reviewed the prenatal and intrapartum course. The delivery was at 39 gestational weeks.  Anesthesia: spinal. Postpartum course has been getting back into the swing of things. Baby's course has been well. Baby is feeding by bottle - Similac Advance. Bleeding: patient has not not resumed menses, with No LMP recorded.. Bowel function is normal. Bladder function is normal. Patient is not sexually active. Contraception method desired is ParaGard  IUD. Postpartum depression screening: negative.  EDPS score is 0.   The following portions of the patient's history were reviewed and updated as appropriate: allergies, current medications, past family history, past medical history, past social history, past surgical history, and problem list.  Review of Systems Pertinent items are noted in HPI.   Objective:    BP 133/88   Pulse 83   Ht 5' 5 (1.651 m)   Wt 232 lb (105.2 kg)   Breastfeeding No   BMI 38.61 kg/m   General:  alert and no distress   Breasts:  inspection negative, no nipple discharge or bleeding, no masses or nodularity palpable  Lungs: clear to auscultation bilaterally  Heart:  regular rate and rhythm, S1, S2 normal, no murmur, click, rub or gallop  Abdomen: soft, non-tender; bowel sounds normal; no masses,  no organomegaly.  Well healed Pfannenstiel incision   Vulva:  normal  Vagina: normal vagina, no discharge, exudate, lesion, or erythema  Cervix:  no cervical motion tenderness and no lesions  Corpus: normal size, contour, position, consistency, mobility, non-tender  Adnexa:  normal adnexa and no mass, fullness, tenderness  Rectal Exam: Not performed.   IUD Insertion Procedure Note Patient identified, informed consent performed, consent signed.   Discussed risks  of irregular bleeding, cramping, infection, malpositioning or misplacement of the IUD outside the uterus which may require further procedure such as laparoscopy. Also discussed >99% contraception efficacy, increased risk of ectopic pregnancy with failure of method.   Emphasized that this did not protect against STIs, condoms recommended during all sexual encounters. Time out was performed.  Urine pregnancy test faintly positive.  Speculum placed in the vagina.  Cervix visualized.  Cleaned with Betadine  x 2.  Grasped anteriorly with a single tooth tenaculum.  Uterus sounded to 8 cm.  ParaGard  IUD placed per manufacturer's recommendations.  Strings trimmed to 3 cm. Tenaculum was removed, good hemostasis noted.  Patient tolerated procedure well.   Patient was given post-procedure instructions.  She was advised to have backup contraception for one week.  Patient was also asked to check IUD strings periodically and follow up in 4 weeks for IUD check.       Labs:  Lab Results  Component Value Date   HGB 7.6 (L) 11/26/2023     Assessment:   1. Postpartum care following cesarean delivery   2. Encounter for IUD insertion   3. Anemia, unspecified type      Plan:  Judy Conner is a 38 y.o. [id number] s/p rLTCS with delayed PPH. Today is doing well. EPDS=0.  -Contraception: ParaGard  IUD insertion as above; checking hCG quant for the likely false positive UPT today, if positive, will check quant again after 48hrs.  -CBC for postpartum anemia -Okay to resume all regular activity as tolerated-link to postpartum exercises in AVS today. Do exercises 3-5 times weekly, daily if able.  -  Reviewed returning to intercourse expectations. -Hair loss and pelvic floor function expectations discussed. -Continue PNV as daily multivitamin. -Moods reviewed, alert clinic if developing PPD/A -RTC in 4wks for IUD check and in 12 months for annual, sooner if concerns.   Sofia Dunn, DO  [facility]

## 2023-12-29 ENCOUNTER — Ambulatory Visit: Payer: Self-pay | Admitting: Obstetrics

## 2023-12-29 LAB — CBC
Hematocrit: 34.2 % (ref 34.0–46.6)
Hemoglobin: 10.8 g/dL — ABNORMAL LOW (ref 11.1–15.9)
MCH: 28.6 pg (ref 26.6–33.0)
MCHC: 31.6 g/dL (ref 31.5–35.7)
MCV: 91 fL (ref 79–97)
Platelets: 484 10*3/uL — ABNORMAL HIGH (ref 150–450)
RBC: 3.78 x10E6/uL (ref 3.77–5.28)
RDW: 16.4 % — ABNORMAL HIGH (ref 11.7–15.4)
WBC: 11.3 10*3/uL — ABNORMAL HIGH (ref 3.4–10.8)

## 2023-12-29 LAB — HCG, SERUM, QUALITATIVE: hCG,Beta Subunit,Qual,Serum: NEGATIVE m[IU]/mL (ref <6)

## 2023-12-30 LAB — SPECIMEN STATUS REPORT

## 2023-12-30 LAB — BETA HCG QUANT (REF LAB): hCG Quant: <1 m[IU]/mL

## 2024-01-11 ENCOUNTER — Ambulatory Visit: Admitting: Obstetrics

## 2024-01-30 NOTE — Progress Notes (Unsigned)
    GYNECOLOGY PROGRESS NOTE  Subjective:  PCP: Joshua Debby CROME, MD  Patient ID: Judy Conner, female    DOB: [birth year], 38 y.o.   MRN: [id number]  HPI  Patient is a 38 y.o. [id number] female who presents for IUD check following Paragard  insertion 12/28/23 at her postpartum visit for [facility] 11/25/23.  {Common ambulatory SmartLinks:19316}  Review of Systems {ros; complete:30496}   Objective:   not currently breastfeeding. There is no height or weight on file to calculate BMI.  General appearance: {general exam:16600} Abdomen: {abdominal exam:16834} Pelvic: {pelvic exam:16852::cervix normal in appearance,external genitalia normal,no adnexal masses or tenderness,no cervical motion tenderness,rectovaginal septum normal,uterus normal size, shape, and consistency,vagina normal without discharge} Extremities: {extremity exam:5109} Neurologic: {neuro exam:17854}   Assessment/Plan:   No diagnosis found.   There are no diagnoses linked to this encounter.     Estil Mangle, DO [facility]

## 2024-01-30 NOTE — Progress Notes (Deleted)
    GYNECOLOGY PROGRESS NOTE  Subjective:  PCP: Joshua Debby CROME, MD  Patient ID: Judy Conner, female    DOB: [birth year], 38 y.o.   MRN: [id number]  HPI  Patient is a 38 y.o. [id number] female who presents for IUD check following Paragard  insertion 12/28/23 at her postpartum visit for [facility] 11/25/23.  {Common ambulatory SmartLinks:19316}  Review of Systems {ros; complete:30496}   Objective:   not currently breastfeeding. There is no height or weight on file to calculate BMI.  General appearance: {general exam:16600} Abdomen: {abdominal exam:16834} Pelvic: {pelvic exam:16852::cervix normal in appearance,external genitalia normal,no adnexal masses or tenderness,no cervical motion tenderness,rectovaginal septum normal,uterus normal size, shape, and consistency,vagina normal without discharge} Extremities: {extremity exam:5109} Neurologic: {neuro exam:17854}   Assessment/Plan:   No diagnosis found.   There are no diagnoses linked to this encounter.     Estil Mangle, DO [facility]

## 2024-01-31 ENCOUNTER — Encounter: Payer: Self-pay | Admitting: Obstetrics

## 2024-01-31 ENCOUNTER — Other Ambulatory Visit (HOSPITAL_COMMUNITY)
Admission: RE | Admit: 2024-01-31 | Discharge: 2024-01-31 | Disposition: A | Discharge status: Home / Self Care | Source: Ambulatory Visit | Attending: Obstetrics | Admitting: Obstetrics

## 2024-01-31 ENCOUNTER — Ambulatory Visit: Admitting: Obstetrics

## 2024-01-31 ENCOUNTER — Ambulatory Visit (INDEPENDENT_AMBULATORY_CARE_PROVIDER_SITE_OTHER): Admitting: Obstetrics

## 2024-01-31 VITALS — BP 133/88 | HR 91 | Ht 65.0 in | Wt 234.2 lb

## 2024-01-31 DIAGNOSIS — Z975 Presence of (intrauterine) contraceptive device: Secondary | ICD-10-CM | POA: Insufficient documentation

## 2024-01-31 DIAGNOSIS — N760 Acute vaginitis: Secondary | ICD-10-CM

## 2024-01-31 DIAGNOSIS — Z30431 Encounter for routine checking of intrauterine contraceptive device: Secondary | ICD-10-CM

## 2024-02-01 LAB — CERVICOVAGINAL ANCILLARY ONLY
Bacterial Vaginitis (gardnerella): NEGATIVE
Candida Glabrata: NEGATIVE
Candida Vaginitis: NEGATIVE
Chlamydia: NEGATIVE
Comment: NEGATIVE
Comment: NEGATIVE
Comment: NEGATIVE
Comment: NEGATIVE
Comment: NEGATIVE
Comment: NORMAL
Neisseria Gonorrhea: NEGATIVE
Trichomonas: NEGATIVE

## 2024-06-05 ENCOUNTER — Ambulatory Visit (LOCAL_COMMUNITY_HEALTH_CENTER): Payer: Self-pay

## 2024-06-05 DIAGNOSIS — Z111 Encounter for screening for respiratory tuberculosis: Secondary | ICD-10-CM

## 2024-06-05 NOTE — Progress Notes (Unsigned)
 SABRA

## 2024-06-06 ENCOUNTER — Ambulatory Visit (INDEPENDENT_AMBULATORY_CARE_PROVIDER_SITE_OTHER)

## 2024-06-06 VITALS — BP 120/80 | HR 66 | Temp 98.3°F | Ht 65.0 in | Wt 225.7 lb

## 2024-06-06 DIAGNOSIS — R8290 Unspecified abnormal findings in urine: Secondary | ICD-10-CM

## 2024-06-06 LAB — POCT URINALYSIS DIPSTICK
Bilirubin, UA: NEGATIVE
Blood, UA: POSITIVE
Glucose, UA: NEGATIVE
Ketones, UA: NEGATIVE
Nitrite, UA: POSITIVE
Protein, UA: POSITIVE — AB
Spec Grav, UA: 1.01 (ref 1.010–1.025)
Urobilinogen, UA: 0.2 U/dL
pH, UA: 7.5 (ref 5.0–8.0)

## 2024-06-06 MED ORDER — FLUCONAZOLE 150 MG PO TABS: 150.0000 mg | ORAL_TABLET | Freq: Every day | ORAL | 1 refills | Status: AC

## 2024-06-06 MED ORDER — SULFAMETHOXAZOLE-TRIMETHOPRIM 800-160 MG PO TABS: 1.0000 | ORAL_TABLET | Freq: Two times a day (BID) | ORAL | 0 refills | Status: AC

## 2024-06-06 NOTE — Progress Notes (Addendum)
    NURSE VISIT NOTE  Subjective:    Patient ID: Judy Conner, female    DOB: [birth year], 38 y.o.   MRN: [id number]       HPI  Patient is a 38 y.o. [id number] female who presents for pelvic pain and cloudy malordorous urine for 1 week.  Patient denies urinary frequency.  Patient does have a history of recurrent UTI.  Patient does not have a history of pyelonephritis.   Patient states that if she is to get antibiotics then she would need something for yeast infection because she always gets a yeast infection after taking antibiotics.  Objective:    BP 120/80   Pulse 66   Temp 98.3 F (36.8 C)   Ht 5' 5 (1.651 m)   Wt 225 lb 11.2 oz (102.4 kg)   BMI 37.56 kg/m    Lab Review  Results for orders placed or performed in visit on 06/06/24  POCT Urinalysis Dipstick  Result Value Ref Range   Color, UA     Clarity, UA     Glucose, UA Negative Negative   Bilirubin, UA Negative    Ketones, UA Negative    Spec Grav, UA 1.010 1.010 - 1.025   Blood, UA positive    pH, UA 7.5 5.0 - 8.0   Protein, UA Positive (A) Negative   Urobilinogen, UA 0.2 0.2 or 1.0 E.U./dL   Nitrite, UA Positive    Leukocytes, UA Small (1+) (A) Negative   Appearance     Odor      Assessment:   1. Abnormal urine odor      Plan:   Urine Culture Sent. Treatment  Bactrim  DS 1 PO BID for 7 days. Follow up if symptoms worsen or fail to improve as anticipated, and as needed.  Also sent Diflucan , per request.     Mathis LITTIE Getting, CMA

## 2024-06-06 NOTE — Patient Instructions (Signed)
 Pregnancy and Urinary Tract Infection  A urinary tract infection (UTI) is an infection of any part of the urinary tract. This includes the kidneys, the tubes that connect the kidneys to the bladder (ureters), the bladder, and the tube that carries urine out of the body (urethra). These organs make, store, and get rid of urine in the body. Your health care provider may use other names to describe the infection. An upper UTI affects the ureters and kidneys (pyelonephritis). A lower UTI affects the bladder (cystitis) and urethra (urethritis). Most UTIs are caused by bacteria in the genital area, around the entrance to the urinary tract. These bacteria grow and cause irritation and inflammation of the urinary tract. You are more likely to develop a UTI during pregnancy because: The physical and hormonal changes that your body goes through make it easier for bacteria to get into your urinary tract. Your growing baby puts pressure on your bladder and can affect urine flow. Pregnant women with diabetes are at an increased risk for developing a UTI. It is important to recognize and treat UTIs in pregnancy because they can cause serious complications for both you and your baby. How does this affect me? Symptoms of a UTI include: Needing to urinate right away (urgently) and often, even if urinating a small amount. Pain, burning, or having a hard time passing urine. Blood in the urine. Unusual, cloudy, and bad-smelling urine. Pain in the abdomen or lower back. Vaginal discharge. You may also have: Vomiting or a decreased appetite. Confusion. Irritability or tiredness. A fever. Diarrhea. A low level of red blood cells (anemia). The development of high blood pressure during pregnancy (preeclampsia). How does this affect my baby? An untreated UTI during pregnancy could lead to a kidney infection or an infection throughout the mother's body (systemic infection). This can cause health problems and affect the  baby. Possible complications of an untreated UTI include: Your baby being born before 37 weeks of pregnancy (premature). Your baby being born with a low birth weight. Your baby having a higher risk of having his or her skin or the white parts of the eyes turn yellow (jaundice). What can I do to lower my risk? To prevent a UTI: Do not hold urine for long periods of time. Empty your bladder as soon as you feel the urge. Always wipe from front to back, especially after a bowel movement. Use each tissue one time when you wipe. Empty your bladder after sex. Keep your genital area dry. Drink 6 to 8 glasses of water each day. Do not douche or use deodorant sprays. Wear cotton underwear and loose clothing. How is this treated? Treatment for this condition may include: Antibiotic medicines that are safe to take during pregnancy. Other medicines to treat less common causes of UTI. Follow these instructions at home: If you were prescribed an antibiotic medicine, take it as told by your health care provider. Do not stop using the antibiotic even if you start to feel better. Keep all follow-up visits. This is important. Contact a health care provider if: Your symptoms do not improve or they get worse. You have abnormal vaginal discharge. Get help right away if you: Have a fever. Have nausea and vomiting. Have back or side pain. Have lower belly pain, tightness, or feel contractions in your uterus. Have a gush of fluid from your vagina. Have blood in your urine. Summary A UTI is an infection of any part of the urinary tract, which includes the kidneys, ureters, bladder,  and urethra. Most urinary tract infections are caused by bacteria in your genital area, around the entrance to your urinary tract (urethra). You are more likely to develop a UTI during pregnancy. It is important to recognize and treat UTIs in pregnancy because of the risk of serious complications for both you and your baby. If you  were prescribed an antibiotic medicine, take it as told by your health care provider. Do not stop using the antibiotic even if you start to feel better. This information is not intended to replace advice given to you by your health care provider. Make sure you discuss any questions you have with your health care provider. Document Revised: 02/18/2021 Document Reviewed: 02/18/2021 Elsevier Patient Education  2024 [employer].

## 2024-06-06 NOTE — Progress Notes (Deleted)
    NURSE VISIT NOTE  Subjective:    Patient ID: Judy Conner, female    DOB: [birth year], 38 y.o.   MRN: [id number]       HPI  Patient is a 38 y.o. [id number] female who presents for pelvic pain and cloudy malordorous urine for 1 week.  Patient denies urinary frequency.  Patient does have a history of recurrent UTI.  Patient does not have a history of pyelonephritis.   Patient states that if she is to get antibiotics then she would need something for yeast infection because she always gets a yeast infection after taking antibiotics.  Objective:    BP 120/80   Pulse 66   Temp 98.3 F (36.8 C)   Ht 5' 5 (1.651 m)   Wt 225 lb 11.2 oz (102.4 kg)   BMI 37.56 kg/m    Lab Review  No results found for any visits on 06/06/24.  Assessment:   1. Abnormal urine odor      Plan:   Urine Culture Sent. Treatment  Bactrim  DS 1 PO BID for 7 days. Follow up if symptoms worsen or fail to improve as anticipated, and as needed.  Also sent Diflucan , per request.     Mathis LITTIE Getting, CMA

## 2024-06-07 ENCOUNTER — Ambulatory Visit (LOCAL_COMMUNITY_HEALTH_CENTER)

## 2024-06-07 DIAGNOSIS — Z111 Encounter for screening for respiratory tuberculosis: Secondary | ICD-10-CM

## 2024-06-07 LAB — TB SKIN TEST
Induration: 0 mm
TB Skin Test: NEGATIVE

## 2024-06-10 LAB — CULTURE, OB URINE

## 2024-06-10 LAB — URINE CULTURE, OB REFLEX

## 2024-06-11 ENCOUNTER — Ambulatory Visit: Payer: Self-pay

## 2024-08-02 ENCOUNTER — Other Ambulatory Visit (HOSPITAL_COMMUNITY)
Admission: RE | Admit: 2024-08-02 | Discharge: 2024-08-02 | Disposition: A | Source: Ambulatory Visit | Attending: Certified Nurse Midwife | Admitting: Certified Nurse Midwife

## 2024-08-02 ENCOUNTER — Ambulatory Visit

## 2024-08-02 VITALS — BP 120/80 | HR 77 | Wt 221.0 lb

## 2024-08-02 DIAGNOSIS — R300 Dysuria: Secondary | ICD-10-CM | POA: Diagnosis not present

## 2024-08-02 DIAGNOSIS — N760 Acute vaginitis: Secondary | ICD-10-CM | POA: Diagnosis present

## 2024-08-02 DIAGNOSIS — Z87440 Personal history of urinary (tract) infections: Secondary | ICD-10-CM | POA: Diagnosis not present

## 2024-08-02 LAB — POCT URINALYSIS DIPSTICK
Bilirubin, UA: NEGATIVE
Blood, UA: NEGATIVE
Glucose, UA: NEGATIVE
Ketones, UA: NEGATIVE
Leukocytes, UA: NEGATIVE
Nitrite, UA: NEGATIVE
Protein, UA: NEGATIVE
Spec Grav, UA: 1.015
Urobilinogen, UA: 0.2 U/dL
pH, UA: 7

## 2024-08-02 NOTE — Progress Notes (Signed)
" ° ° °  NURSE VISIT NOTE  Subjective:    Patient ID: Judy Conner, female    DOB: 03/31/1986, 39 y.o.   MRN: [id number]  HPI  Patient is a 39 y.o. [id number] female who presents for vaginal burning and a foul smelling urine worsening over the last few days.  She says the burning feels like it is around her urethra.  She has had a little intermittent itching in the area, but is not itching today.  She thinks it may be due to the Nair she uses.  She says her discharge is normal in appearance.  She denies any dysuria or flank pain, but is concerned that her UTI never resolved from November despite completing her antibiotics.     Objective:    BP 120/80   Pulse 77   Wt 221 lb (100.2 kg)   BMI 36.78 kg/m    Results for orders placed or performed in visit on 08/02/24  POCT Urinalysis Dipstick  Result Value Ref Range   Color, UA     Clarity, UA     Glucose, UA Negative Negative   Bilirubin, UA Negative    Ketones, UA Negative    Spec Grav, UA 1.015 1.010 - 1.025   Blood, UA Negative    pH, UA 7.0 5.0 - 8.0   Protein, UA Negative Negative   Urobilinogen, UA 0.2 0.2 or 1.0 E.U./dL   Nitrite, UA Negative    Leukocytes, UA Negative Negative   Appearance     Odor      Assessment:   1. Acute vaginitis   2. History of UTI   3. Dysuria       Plan:   GC and chlamydia DNA  probe sent to lab.  Discussed boric acid use to help with vaginal discomfort and odor. UA was negative, but culture sent given pt's continued symptoms and previous UTI.  Discussed AZO use to help with pain while awaiting results. ROV prn if symptoms persist or worsen.   Rollo FORBES Louder, RN  "

## 2024-08-06 LAB — CERVICOVAGINAL ANCILLARY ONLY
Bacterial Vaginitis (gardnerella): POSITIVE — AB
Candida Glabrata: NEGATIVE
Candida Vaginitis: POSITIVE — AB
Chlamydia: NEGATIVE
Comment: NEGATIVE
Comment: NEGATIVE
Comment: NEGATIVE
Comment: NEGATIVE
Comment: NEGATIVE
Comment: NORMAL
Neisseria Gonorrhea: NEGATIVE
Trichomonas: NEGATIVE

## 2024-08-06 LAB — URINE CULTURE

## 2024-08-07 ENCOUNTER — Other Ambulatory Visit: Payer: Self-pay

## 2024-08-07 ENCOUNTER — Ambulatory Visit: Payer: Self-pay

## 2024-08-07 DIAGNOSIS — B9689 Other specified bacterial agents as the cause of diseases classified elsewhere: Secondary | ICD-10-CM

## 2024-08-07 DIAGNOSIS — N390 Urinary tract infection, site not specified: Secondary | ICD-10-CM

## 2024-08-07 DIAGNOSIS — B379 Candidiasis, unspecified: Secondary | ICD-10-CM

## 2024-08-07 MED ORDER — METRONIDAZOLE 500 MG PO TABS: 500.0000 mg | ORAL_TABLET | Freq: Two times a day (BID) | ORAL | 0 refills | Status: AC

## 2024-08-07 MED ORDER — FLUCONAZOLE 150 MG PO TABS: 150.0000 mg | ORAL_TABLET | Freq: Once | ORAL | 1 refills | Status: AC

## 2024-08-07 MED ORDER — NITROFURANTOIN MONOHYD MACRO 100 MG PO CAPS: 100.0000 mg | ORAL_CAPSULE | Freq: Two times a day (BID) | ORAL | 0 refills | Status: AC

## 2024-08-07 NOTE — Progress Notes (Signed)
 Rx to treat yeast and BV,

## 2024-08-07 NOTE — Progress Notes (Signed)
 Rx for UTI sent in.  Per culture, bacteria resistant to Sulfa .  Macrobid  sent instead.
